# Patient Record
Sex: Female | Born: 1970
Health system: Southern US, Community
[De-identification: ages and names within clinical notes are randomized; demographics above are authoritative.]

## PROBLEM LIST (undated history)

## (undated) DIAGNOSIS — Z0282 Encounter for adoption services: Secondary | ICD-10-CM

## (undated) DIAGNOSIS — E119 Type 2 diabetes mellitus without complications: Secondary | ICD-10-CM

## (undated) DIAGNOSIS — J45909 Unspecified asthma, uncomplicated: Secondary | ICD-10-CM

## (undated) DIAGNOSIS — R519 Headache, unspecified: Secondary | ICD-10-CM

## (undated) DIAGNOSIS — Z789 Other specified health status: Secondary | ICD-10-CM

## (undated) DIAGNOSIS — S83519A Sprain of anterior cruciate ligament of unspecified knee, initial encounter: Secondary | ICD-10-CM

## (undated) DIAGNOSIS — R51 Headache: Secondary | ICD-10-CM

## (undated) DIAGNOSIS — E785 Hyperlipidemia, unspecified: Secondary | ICD-10-CM

## (undated) DIAGNOSIS — T7840XA Allergy, unspecified, initial encounter: Secondary | ICD-10-CM

## (undated) DIAGNOSIS — M1990 Unspecified osteoarthritis, unspecified site: Secondary | ICD-10-CM

## (undated) HISTORY — DX: Hyperlipidemia, unspecified: E78.5

## (undated) HISTORY — PX: FOOT SURGERY: SHX648

## (undated) HISTORY — DX: Type 2 diabetes mellitus without complications: E11.9

## (undated) HISTORY — DX: Allergy, unspecified, initial encounter: T78.40XA

---

## 2004-06-11 ENCOUNTER — Ambulatory Visit: Payer: Self-pay | Admitting: Unknown Physician Specialty

## 2010-08-06 ENCOUNTER — Ambulatory Visit: Payer: Self-pay

## 2010-09-08 ENCOUNTER — Ambulatory Visit: Payer: Self-pay

## 2012-07-21 ENCOUNTER — Ambulatory Visit: Payer: Self-pay | Admitting: Family Medicine

## 2013-02-20 ENCOUNTER — Ambulatory Visit: Payer: Self-pay | Admitting: Family Medicine

## 2013-10-22 ENCOUNTER — Ambulatory Visit: Payer: Self-pay | Admitting: Family Medicine

## 2013-12-03 HISTORY — PX: APPENDECTOMY: SHX54

## 2013-12-31 ENCOUNTER — Ambulatory Visit: Payer: Self-pay | Admitting: Surgery

## 2013-12-31 LAB — CBC WITH DIFFERENTIAL/PLATELET
BASOS ABS: 0.1 10*3/uL (ref 0.0–0.1)
Basophil %: 0.5 %
Eosinophil #: 0 10*3/uL (ref 0.0–0.7)
Eosinophil %: 0.2 %
HCT: 42.3 % (ref 35.0–47.0)
HGB: 13.9 g/dL (ref 12.0–16.0)
LYMPHS ABS: 1.8 10*3/uL (ref 1.0–3.6)
Lymphocyte %: 11.8 %
MCH: 29.2 pg (ref 26.0–34.0)
MCHC: 32.7 g/dL (ref 32.0–36.0)
MCV: 89 fL (ref 80–100)
MONO ABS: 0.7 x10 3/mm (ref 0.2–0.9)
MONOS PCT: 4.4 %
NEUTROS PCT: 83.1 %
Neutrophil #: 12.8 10*3/uL — ABNORMAL HIGH (ref 1.4–6.5)
Platelet: 401 10*3/uL (ref 150–440)
RBC: 4.75 10*6/uL (ref 3.80–5.20)
RDW: 12.7 % (ref 11.5–14.5)
WBC: 15.4 10*3/uL — ABNORMAL HIGH (ref 3.6–11.0)

## 2013-12-31 LAB — URINALYSIS, COMPLETE
Bacteria: NONE SEEN
Bilirubin,UR: NEGATIVE
Glucose,UR: 50 mg/dL (ref 0–75)
KETONE: NEGATIVE
Leukocyte Esterase: NEGATIVE
NITRITE: NEGATIVE
Ph: 5 (ref 4.5–8.0)
SPECIFIC GRAVITY: 1.023 (ref 1.003–1.030)
Squamous Epithelial: 4
WBC UR: 3 /HPF (ref 0–5)

## 2013-12-31 LAB — COMPREHENSIVE METABOLIC PANEL
ALT: 29 U/L (ref 12–78)
AST: 23 U/L (ref 15–37)
Albumin: 4.1 g/dL (ref 3.4–5.0)
Alkaline Phosphatase: 55 U/L
Anion Gap: 5 — ABNORMAL LOW (ref 7–16)
BUN: 9 mg/dL (ref 7–18)
Bilirubin,Total: 0.5 mg/dL (ref 0.2–1.0)
Calcium, Total: 9.2 mg/dL (ref 8.5–10.1)
Chloride: 106 mmol/L (ref 98–107)
Co2: 27 mmol/L (ref 21–32)
Creatinine: 1.08 mg/dL (ref 0.60–1.30)
EGFR (Non-African Amer.): >60
GLUCOSE: 178 mg/dL — AB (ref 65–99)
OSMOLALITY: 279 (ref 275–301)
POTASSIUM: 3.7 mmol/L (ref 3.5–5.1)
Sodium: 138 mmol/L (ref 136–145)
Total Protein: 7.8 g/dL (ref 6.4–8.2)

## 2013-12-31 LAB — LIPASE, BLOOD: Lipase: 198 U/L (ref 73–393)

## 2014-01-04 LAB — PATHOLOGY REPORT

## 2014-05-21 ENCOUNTER — Ambulatory Visit: Payer: Self-pay | Admitting: Family Medicine

## 2014-07-25 ENCOUNTER — Ambulatory Visit: Payer: Self-pay | Admitting: Specialist

## 2014-10-26 NOTE — Op Note (Signed)
PATIENT NAME:  Lisa NoraVERDECK, Makylee D MR#:  [id number] DATE OF BIRTH:  March 04, 1971  DATE OF PROCEDURE:  12/31/2013  PREOPERATIVE DIAGNOSIS: Acute appendicitis.   POSTOPERATIVE DIAGNOSIS: Acute appendicitis.   PROCEDURE: Laparoscopic appendectomy.   SURGEON: Richard E. Excell Seltzerooper, M.D.   ANESTHESIA: General with endotracheal tube.   INDICATIONS: This is a patient with two days of abdominal pain. She also had some microscopic hematuria but has not noticed any hematuria nor back pain. A CT scan suggests acute appendicitis, as does physical exam. Preoperatively, we discussed the rationale for surgery, the options of observation, risks of bleeding, infection, recurrence of symptoms, failure to resolve her symptoms, open procedure, bowel injury, and negative laparoscopy. This was all reviewed for her. She understood and agreed to proceed.   FINDINGS: Acute appendicitis in a retrocecal position, nonruptured.   DESCRIPTION OF PROCEDURE: The patient was induced to general anesthesia, given IV antibiotics. VTE prophylaxis was in place. She was prepped and draped in a sterile fashion, and a Foley catheter had been placed as well.   Local anesthetic was infiltrated in skin and subcutaneous tissues around the periumbilical area. Incision was made. Veress needle was placed. Pneumoperitoneum was obtained, and a 5 mm trocar port was placed. The abdominal cavity was explored and, under direct vision, a 12 mm left lateral port and a 5 mm suprapubic port were placed. The appendix was identified, invested within the visceral peritoneum of the cecum. The visceral peritoneum was taken down carefully to demonstrate the inflamed appendix.   The distal portion of the appendix was inflamed. The proximal portion was soft and pliable. The base of the appendix was divided with a standard load Endo GIA. The base of the appendix was then grasped and elevated, and the mesoappendix was divided with a vascular load Endo GIA. Specimen was  passed out through the lateral port site with the aid of an EndoCatch bag. The area was checked for hemostasis, found to be adequate. There was no sign of bleeding or bowel injury, and no further adhesions were noted. Therefore, the left lateral port site was closed under direct vision with multiple simple sutures of 0 Vicryl utilizing an Endo Close technique. Again, hemostasis was checked and the pneumoperitoneum was released. All ports were removed. A 4-0 subcuticular Monocryl was used on all skin edges. Steri-Strips, Mastisol and sterile dressings were placed.   The patient tolerated the procedure well. There were no complications. She was taken to the recovery room in stable condition to be admitted for continued care.    ____________________________ Adah Salvageichard E. Excell Seltzerooper, MD rec:cg D: 12/31/2013 16:35:35 ET T: 01/01/2014 04:42:14 ET JOB#: [id number]  cc: Adah Salvageichard E. Excell Seltzerooper, MD, <Dictator> Lattie HawICHARD E COOPER MD ELECTRONICALLY SIGNED 01/01/2014 9:33

## 2014-10-26 NOTE — H&P (Signed)
Subjective/Chief Complaint RLQ pain   History of Present Illness 36-48 hrs abd pain n/v nop f/c prior episode at age 44, hospitalized and observed no hematuria   Past History PMH DM PSH feet   Past Medical Health Diabetes Mellitus   Past Med/Surgical Hx:  Diabetes Mellitus, Type II (NIDD):   ALLERGIES:  No Known Allergies:   Family and Social History:  Family History Non-Contributory   Social History negative tobacco, negative ETOH   Place of Living Home   Review of Systems:  Fever/Chills No   Cough No   Abdominal Pain Yes   Diarrhea No   Constipation No   Nausea/Vomiting Yes   SOB/DOE No   Chest Pain No   Dysuria No   Tolerating Diet No  Nauseated  Vomiting   Medications/Allergies Reviewed Medications/Allergies reviewed   Physical Exam:  GEN no acute distress   HEENT pink conjunctivae   NECK supple   RESP normal resp effort  clear BS   CARD regular rate   ABD positive tenderness  guarding, rebound perc tendrness   LYMPH negative neck   EXTR negative edema   SKIN normal to palpation   PSYCH alert   Lab Results: Hepatic:  29-Jun-15 11:50   Bilirubin, Total 0.5  Alkaline Phosphatase 55 (45-117 NOTE: New Reference Range 05/25/13)  SGPT (ALT) 29  SGOT (AST) 23  Total Protein, Serum 7.8  Albumin, Serum 4.1  Routine Chem:  29-Jun-15 11:50   Glucose, Serum  178  BUN 9  Creatinine (comp) 1.08  Sodium, Serum 138  Potassium, Serum 3.7  Chloride, Serum 106  CO2, Serum 27  Calcium (Total), Serum 9.2  Osmolality (calc) 279  eGFR (African American) >60  eGFR (Non-African American) >60 (eGFR values <28m/min/1.73 m2 may be an indication of chronic kidney disease (CKD). Calculated eGFR is useful in patients with stable renal function. The eGFR calculation will not be reliable in acutely ill patients when serum creatinine is changing rapidly. It is not useful in  patients on dialysis. The eGFR calculation may not be applicable to  patients at the low and high extremes of body sizes, pregnant women, and vegetarians.)  Anion Gap  5  Lipase 198 (Result(s) reported on 31 Dec 2013 at 12:19PM.)  Routine UA:  29-Jun-15 11:50   Color (UA) Yellow  Clarity (UA) Hazy  Glucose (UA) 50 mg/dL  Bilirubin (UA) Negative  Ketones (UA) Negative  Specific Gravity (UA) 1.023  Blood (UA) 3+  pH (UA) 5.0  Protein (UA) 30 mg/dL  Nitrite (UA) Negative  Leukocyte Esterase (UA) Negative (Result(s) reported on 31 Dec 2013 at 12:19PM.)  RBC (UA) 446 /HPF  WBC (UA) 3 /HPF  Bacteria (UA) NONE SEEN  Epithelial Cells (UA) 4 /HPF  Mucous (UA) PRESENT (Result(s) reported on 31 Dec 2013 at 12:19PM.)  Routine Hem:  29-Jun-15 11:50   WBC (CBC)  15.4  RBC (CBC) 4.75  Hemoglobin (CBC) 13.9  Hematocrit (CBC) 42.3  Platelet Count (CBC) 401  MCV 89  MCH 29.2  MCHC 32.7  RDW 12.7  Neutrophil % 83.1  Lymphocyte % 11.8  Monocyte % 4.4  Eosinophil % 0.2  Basophil % 0.5  Neutrophil #  12.8  Lymphocyte # 1.8  Monocyte # 0.7  Eosinophil # 0.0  Basophil # 0.1 (Result(s) reported on 31 Dec 2013 at 12:11PM.)   Radiology Results: CT:    29-Jun-15 14:24, CT Abdomen and Pelvis With Contrast  CT Abdomen and Pelvis With Contrast  REASON FOR EXAM:    (  1) RLQ pain r/o appy; (2) RLQ pain r/o appy  COMMENTS:       PROCEDURE: CT  - CT ABDOMEN / PELVIS  W  - Dec 31 2013  2:24PM     CLINICAL DATA:  Right lower quadrant pain.  Urinary frequency.    EXAM:  CT ABDOMEN AND PELVIS WITH CONTRAST    TECHNIQUE:  Multidetector CT imaging of the abdomen and pelvis was performed  using the standard protocol following bolus administration of  intravenous contrast.  CONTRAST:  100 mL Isovue 370    COMPARISON:  None.    FINDINGS:  The distal appendix is prominent measuring approximately 8 mm in  maximum diameter. No significant periappendiceal inflammatory change  seen, and the proximal appendix is not enlarged. These findings are  equivocal for early  appendicitis.    No other inflammatory process or abnormal fluid collections  identified within the abdomen or pelvis. No evidence of bowel wall  thickening or dilatation.    The liver, gallbladder, pancreas, spleen, adrenal glands, and  kidneys are normal in appearance. No evidence hydronephrosis. No  soft tissue masses or lymphadenopathy identified. Uterus and adnexal  regions are unremarkable. No suspicious bone lesions identified.     IMPRESSION:  Findings which are equivocal for early distal appendicitis.  Recommend correlation with physical exam findings and continued  clinical observation ; consider followup by CT in 24 hr if  warranted.      Electronically Signed    By: Earle Gell M.D.    On: 12/31/2013 14:38     Verified By: Marlaine Hind, M.D.,    Assessment/Admission Diagnosis acute appendicits rec lap appy risks and options agrees with plan   Electronic Signatures: Florene Glen (MD)  (Signed 29-Jun-15 15:10)  Authored: CHIEF COMPLAINT and HISTORY, PAST MEDICAL/SURGIAL HISTORY, ALLERGIES, FAMILY AND SOCIAL HISTORY, REVIEW OF SYSTEMS, PHYSICAL EXAM, LABS, Radiology, ASSESSMENT AND PLAN   Last Updated: 29-Jun-15 15:10 by Florene Glen (MD)

## 2014-12-30 DIAGNOSIS — A770 Spotted fever due to Rickettsia rickettsii: Secondary | ICD-10-CM | POA: Insufficient documentation

## 2014-12-30 DIAGNOSIS — G5600 Carpal tunnel syndrome, unspecified upper limb: Secondary | ICD-10-CM | POA: Insufficient documentation

## 2014-12-30 DIAGNOSIS — E7800 Pure hypercholesterolemia, unspecified: Secondary | ICD-10-CM | POA: Insufficient documentation

## 2014-12-30 DIAGNOSIS — E1165 Type 2 diabetes mellitus with hyperglycemia: Secondary | ICD-10-CM | POA: Insufficient documentation

## 2014-12-30 DIAGNOSIS — E559 Vitamin D deficiency, unspecified: Secondary | ICD-10-CM | POA: Insufficient documentation

## 2014-12-30 DIAGNOSIS — O039 Complete or unspecified spontaneous abortion without complication: Secondary | ICD-10-CM | POA: Insufficient documentation

## 2014-12-30 DIAGNOSIS — E119 Type 2 diabetes mellitus without complications: Secondary | ICD-10-CM | POA: Insufficient documentation

## 2015-01-03 ENCOUNTER — Encounter: Payer: Self-pay | Admitting: Physician Assistant

## 2015-01-03 ENCOUNTER — Ambulatory Visit (INDEPENDENT_AMBULATORY_CARE_PROVIDER_SITE_OTHER): Payer: Commercial Managed Care - HMO | Admitting: Physician Assistant

## 2015-01-03 VITALS — BP 132/76 | HR 100 | Temp 98.9°F | Resp 16 | Ht 62.25 in | Wt 180.0 lb

## 2015-01-03 DIAGNOSIS — E7800 Pure hypercholesterolemia, unspecified: Secondary | ICD-10-CM

## 2015-01-03 DIAGNOSIS — Z0000 Encounter for general adult medical examination without abnormal findings: Secondary | ICD-10-CM

## 2015-01-03 DIAGNOSIS — E119 Type 2 diabetes mellitus without complications: Secondary | ICD-10-CM

## 2015-01-03 LAB — POCT UA - MICROALBUMIN: Microalbumin Ur, POC: 50 mg/L — NL

## 2015-01-03 NOTE — Patient Instructions (Signed)
Health Maintenance Adopting a healthy lifestyle and getting preventive care can go a long way to promote health and wellness. Talk with your health care provider about what schedule of regular examinations is right for you. This is a good chance for you to check in with your provider about disease prevention and staying healthy. In between checkups, there are plenty of things you can do on your own. Experts have done a lot of research about which lifestyle changes and preventive measures are most likely to keep you healthy. Ask your health care provider for more information. WEIGHT AND DIET  Eat a healthy diet 1. Be sure to include plenty of vegetables, fruits, low-fat dairy products, and lean protein. 2. Do not eat a lot of foods high in solid fats, added sugars, or salt. 3. Get regular exercise. This is one of the most important things you can do for your health. 1. Most adults should exercise for at least 150 minutes each week. The exercise should increase your heart rate and make you sweat (moderate-intensity exercise). 2. Most adults should also do strengthening exercises at least twice a week. This is in addition to the moderate-intensity exercise.  Maintain a healthy weight 1. Body mass index (BMI) is a measurement that can be used to identify possible weight problems. It estimates body fat based on height and weight. Your health care provider can help determine your BMI and help you achieve or maintain a healthy weight. 2. For females 6 years of age and older:  1. A BMI below 18.5 is considered underweight. 2. A BMI of 18.5 to 24.9 is normal. 3. A BMI of 25 to 29.9 is considered overweight. 4. A BMI of 30 and above is considered obese.  Watch levels of cholesterol and blood lipids 1. You should start having your blood tested for lipids and cholesterol at 46 years of age, then have this test every 5 years. 2. You may need to have your cholesterol levels checked more often if: 1. Your  lipid or cholesterol levels are high. 2. You are older than 46 years of age. 3. You are at high risk for heart disease.  CANCER SCREENING   Lung Cancer 1. Lung cancer screening is recommended for adults 7-87 years old who are at high risk for lung cancer because of a history of smoking. 2. A yearly low-dose CT scan of the lungs is recommended for people who: 1. Currently smoke. 2. Have quit within the past 15 years. 3. Have at least a 30-pack-year history of smoking. A pack year is smoking an average of one pack of cigarettes a day for 1 year. 3. Yearly screening should continue until it has been 15 years since you quit. 4. Yearly screening should stop if you develop a health problem that would prevent you from having lung cancer treatment.  Breast Cancer  Practice breast self-awareness. This means understanding how your breasts normally appear and feel.  It also means doing regular breast self-exams. Let your health care provider know about any changes, no matter how small.  If you are in your 20s or 30s, you should have a clinical breast exam (CBE) by a health care provider every 1-3 years as part of a regular health exam.  If you are 30 or older, have a CBE every year. Also consider having a breast X-Mad[place] (mammogram) every year.  If you have a family history of breast cancer, talk to your health care provider about genetic screening.  If you are  at high risk for breast cancer, talk to your health care provider about having an MRI and a mammogram every year.  Breast cancer gene (BRCA) assessment is recommended for women who have family members with BRCA-related cancers. BRCA-related cancers include:  Breast.  Ovarian.  Tubal.  Peritoneal cancers.  Results of the assessment will determine the need for genetic counseling and BRCA1 and BRCA2 testing. Cervical Cancer Routine pelvic examinations to screen for cervical cancer are no longer recommended for nonpregnant women who  are considered low risk for cancer of the pelvic organs (ovaries, uterus, and vagina) and who do not have symptoms. A pelvic examination may be necessary if you have symptoms including those associated with pelvic infections. Ask your health care provider if a screening pelvic exam is right for you.   The Pap test is the screening test for cervical cancer for women who are considered at risk.  If you had a hysterectomy for a problem that was not cancer or a condition that could lead to cancer, then you no longer need Pap tests.  If you are older than 65 years, and you have had normal Pap tests for the past 10 years, you no longer need to have Pap tests.  If you have had past treatment for cervical cancer or a condition that could lead to cancer, you need Pap tests and screening for cancer for at least 20 years after your treatment.  If you no longer get a Pap test, assess your risk factors if they change (such as having a new sexual partner). This can affect whether you should start being screened again.  Some women have medical problems that increase their chance of getting cervical cancer. If this is the case for you, your health care provider may recommend more frequent screening and Pap tests.  The human papillomavirus (HPV) test is another test that may be used for cervical cancer screening. The HPV test looks for the virus that can cause cell changes in the cervix. The cells collected during the Pap test can be tested for HPV.  The HPV test can be used to screen women 2 years of age and older. Getting tested for HPV can extend the interval between normal Pap tests from three to five years.  An HPV test also should be used to screen women of any age who have unclear Pap test results.  After 46 years of age, women should have HPV testing as often as Pap tests.  Colorectal Cancer  This type of cancer can be detected and often prevented.  Routine colorectal cancer screening usually  begins at 46 years of age and continues through 46 years of age.  Your health care provider may recommend screening at an earlier age if you have risk factors for colon cancer.  Your health care provider may also recommend using home test kits to check for hidden blood in the stool.  A small camera at the end of a tube can be used to examine your colon directly (sigmoidoscopy or colonoscopy). This is done to check for the earliest forms of colorectal cancer.  Routine screening usually begins at age 57.  Direct examination of the colon should be repeated every 5-10 years through 46 years of age. However, you may need to be screened more often if early forms of precancerous polyps or small growths are found. Skin Cancer  Check your skin from head to toe regularly.  Tell your health care provider about any new moles or changes in  moles, especially if there is a change in a mole's shape or color.  Also tell your health care provider if you have a mole that is larger than the size of a pencil eraser.  Always use sunscreen. Apply sunscreen liberally and repeatedly throughout the day.  Protect yourself by wearing long sleeves, pants, a wide-brimmed hat, and sunglasses whenever you are outside. HEART DISEASE, DIABETES, AND HIGH BLOOD PRESSURE   Have your blood pressure checked at least every 1-2 years. High blood pressure causes heart disease and increases the risk of stroke.  If you are between 32 years and 30 years old, ask your health care provider if you should take aspirin to prevent strokes.  Have regular diabetes screenings. This involves taking a blood sample to check your fasting blood sugar level.  If you are at a normal weight and have a low risk for diabetes, have this test once every three years after 46 years of age.  If you are overweight and have a high risk for diabetes, consider being tested at a younger age or more often. PREVENTING INFECTION  Hepatitis B  If you have a  higher risk for hepatitis B, you should be screened for this virus. You are considered at high risk for hepatitis B if:  You were born in a country where hepatitis B is common. Ask your health care provider which countries are considered high risk.  Your parents were born in a high-risk country, and you have not been immunized against hepatitis B (hepatitis B vaccine).  You have HIV or AIDS.  You use needles to inject street drugs.  You live with someone who has hepatitis B.  You have had sex with someone who has hepatitis B.  You get hemodialysis treatment.  You take certain medicines for conditions, including cancer, organ transplantation, and autoimmune conditions. Hepatitis C  Blood testing is recommended for:  Everyone born from [birth year] through 1967.  Anyone with known risk factors for hepatitis C. Sexually transmitted infections (STIs)  You should be screened for sexually transmitted infections (STIs) including gonorrhea and chlamydia if:  You are sexually active and are younger than 47 years of age.  You are older than 46 years of age and your health care provider tells you that you are at risk for this type of infection.  Your sexual activity has changed since you were last screened and you are at an increased risk for chlamydia or gonorrhea. Ask your health care provider if you are at risk.  If you do not have HIV, but are at risk, it may be recommended that you take a prescription medicine daily to prevent HIV infection. This is called pre-exposure prophylaxis (PrEP). You are considered at risk if:  You are sexually active and do not regularly use condoms or know the HIV status of your partner(s).  You take drugs by injection.  You are sexually active with a partner who has HIV. Talk with your health care provider about whether you are at high risk of being infected with HIV. If you choose to begin PrEP, you should first be tested for HIV. You should then be tested  every 3 months for as long as you are taking PrEP.  PREGNANCY   If you are premenopausal and you may become pregnant, ask your health care provider about preconception counseling.  If you may become pregnant, take 400 to 800 micrograms (mcg) of folic acid every day.  If you want to prevent pregnancy, talk to your  health care provider about birth control (contraception). OSTEOPOROSIS AND MENOPAUSE   Osteoporosis is a disease in which the bones lose minerals and strength with aging. This can result in serious bone fractures. Your risk for osteoporosis can be identified using a bone density scan.  If you are 76 years of age or older, or if you are at risk for osteoporosis and fractures, ask your health care provider if you should be screened.  Ask your health care provider whether you should take a calcium or vitamin D supplement to lower your risk for osteoporosis.  Menopause may have certain physical symptoms and risks.  Hormone replacement therapy may reduce some of these symptoms and risks. Talk to your health care provider about whether hormone replacement therapy is right for you.  HOME CARE INSTRUCTIONS   Schedule regular health, dental, and eye exams.  Stay current with your immunizations.   Do not use any tobacco products including cigarettes, chewing tobacco, or electronic cigarettes.  If you are pregnant, do not drink alcohol.  If you are breastfeeding, limit how much and how often you drink alcohol.  Limit alcohol intake to no more than 1 drink per day for nonpregnant women. One drink equals 12 ounces of beer, 5 ounces of wine, or 1 ounces of hard liquor.  Do not use street drugs.  Do not share needles.  Ask your health care provider for help if you need support or information about quitting drugs.  Tell your health care provider if you often feel depressed.  Tell your health care provider if you have ever been abused or do not feel safe at home. Document  Released: 01/04/2011 Document Revised: 11/05/2013 Document Reviewed: 05/23/2013 [facility] Patient Information 2015 [place], [place]. This information is not intended to replace advice given to you by your health care provider. Make sure you discuss any questions you have with your health care provider.  Fat and Cholesterol Control Diet Fat and cholesterol levels in your blood and organs are influenced by your diet. High levels of fat and cholesterol may lead to diseases of the heart, small and large blood vessels, gallbladder, liver, and pancreas. CONTROLLING FAT AND CHOLESTEROL WITH DIET Although exercise and lifestyle factors are important, your diet is key. That is because certain foods are known to raise cholesterol and others to lower it. The goal is to balance foods for their effect on cholesterol and more importantly, to replace saturated and trans fat with other types of fat, such as monounsaturated fat, polyunsaturated fat, and omega-3 fatty acids. On average, a person should consume no more than 15 to 17 g of saturated fat daily. Saturated and trans fats are considered "bad" fats, and they will raise LDL cholesterol. Saturated fats are primarily found in animal products such as meats, butter, and cream. However, that does not mean you need to give up all your favorite foods. Today, there are good tasting, low-fat, low-cholesterol substitutes for most of the things you like to eat. Choose low-fat or nonfat alternatives. Choose round or loin cuts of red meat. These types of cuts are lowest in fat and cholesterol. Chicken (without the skin), fish, veal, and ground [place] breast are great choices. Eliminate fatty meats, such as hot dogs and salami. Even shellfish have little or no saturated fat. Have a 3 oz (85 g) portion when you eat lean meat, poultry, or fish. Trans fats are also called "partially hydrogenated oils." They are oils that have been scientifically manipulated so that they are solid at  room temperature resulting in a longer shelf life and improved taste and texture of foods in which they are added. Trans fats are found in stick margarine, some tub margarines, cookies, crackers, and baked goods.  When baking and cooking, oils are a great substitute for butter. The monounsaturated oils are especially beneficial since it is believed they lower LDL and raise HDL. The oils you should avoid entirely are saturated tropical oils, such as coconut and palm.  Remember to eat a lot from food groups that are naturally free of saturated and trans fat, including fish, fruit, vegetables, beans, grains (barley, rice, couscous, bulgur wheat), and pasta (without cream sauces).  IDENTIFYING FOODS THAT LOWER FAT AND CHOLESTEROL  Soluble fiber may lower your cholesterol. This type of fiber is found in fruits such as apples, vegetables such as broccoli, potatoes, and carrots, legumes such as beans, peas, and lentils, and grains such as barley. Foods fortified with plant sterols (phytosterol) may also lower cholesterol. You should eat at least 2 g per day of these foods for a cholesterol lowering effect.  Read package labels to identify low-saturated fats, trans fat free, and low-fat foods at the supermarket. Select cheeses that have only 2 to 3 g saturated fat per ounce. Use a heart-healthy tub margarine that is free of trans fats or partially hydrogenated oil. When buying baked goods (cookies, crackers), avoid partially hydrogenated oils. Breads and muffins should be made from whole grains (whole-wheat or whole oat flour, instead of "flour" or "enriched flour"). Buy non-creamy canned soups with reduced salt and no added fats.  FOOD PREPARATION TECHNIQUES  Never deep-fry. If you must fry, either stir-fry, which uses very little fat, or use non-stick cooking sprays. When possible, broil, bake, or roast meats, and steam vegetables. Instead of putting butter or margarine on vegetables, use lemon and herbs,  applesauce, and cinnamon (for squash and sweet potatoes). Use nonfat yogurt, salsa, and low-fat dressings for salads.  LOW-SATURATED FAT / LOW-FAT FOOD SUBSTITUTES Meats / Saturated Fat (g) 4. Avoid: Steak, marbled (3 oz/85 g) / 11 g 5. Choose: Steak, lean (3 oz/85 g) / 4 g 3. Avoid: Hamburger (3 oz/85 g) / 7 g 4. Choose: Hamburger, lean (3 oz/85 g) / 5 g 3. Avoid: Ham (3 oz/85 g) / 6 g 4. Choose: Ham, lean cut (3 oz/85 g) / 2.4 g 5. Avoid: Chicken, with skin, dark meat (3 oz/85 g) / 4 g 6. Choose: Chicken, skin removed, dark meat (3 oz/85 g) / 2 g  Avoid: Chicken, with skin, light meat (3 oz/85 g) / 2.5 g  Choose: Chicken, skin removed, light meat (3 oz/85 g) / 1 g Dairy / Saturated Fat (g)  Avoid: Whole milk (1 cup) / 5 g  Choose: Low-fat milk, 2% (1 cup) / 3 g  Choose: Low-fat milk, 1% (1 cup) / 1.5 g  Choose: Skim milk (1 cup) / 0.3 g  Avoid: Hard cheese (1 oz/28 g) / 6 g  Choose: Skim milk cheese (1 oz/28 g) / 2 to 3 g  Avoid: Cottage cheese, 4% fat (1 cup) / 6.5 g  Choose: Low-fat cottage cheese, 1% fat (1 cup) / 1.5 g  Avoid: Ice cream (1 cup) / 9 g  Choose: Sherbet (1 cup) / 2.5 g  Choose: Nonfat frozen yogurt (1 cup) / 0.3 g  Choose: Frozen fruit bar / trace  Avoid: Whipped cream (1 tbs) / 3.5 g  Choose: Nondairy whipped topping (1 tbs) / 1 g Condiments /   Saturated Fat (g)  Avoid: Mayonnaise (1 tbs) / 2 g  Choose: Low-fat mayonnaise (1 tbs) / 1 g  Avoid: Butter (1 tbs) / 7 g  Choose: Extra light margarine (1 tbs) / 1 g  Avoid: Coconut oil (1 tbs) / 11.8 g  Choose: Olive oil (1 tbs) / 1.8 g  Choose: Corn oil (1 tbs) / 1.7 g  Choose: Safflower oil (1 tbs) / 1.2 g  Choose: Sunflower oil (1 tbs) / 1.4 g  Choose: Soybean oil (1 tbs) / 2.4 g  Choose: Canola oil (1 tbs) / 1 g Document Released: 06/21/2005 Document Revised: 10/16/2012 Document Reviewed: 09/19/2013 [employer] Patient Information 2015 [place], [place]. This information is not intended  to replace advice given to you by your health care provider. Make sure you discuss any questions you have with your health care provider.  American Heart Association (AHA) Exercise Recommendation  Being physically active is important to prevent heart disease and stroke, the nation's No. 1and No. 5killers. To improve overall cardiovascular health, we suggest at least 150 minutes per week of moderate exercise or 75 minutes per week of vigorous exercise (or a combination of moderate and vigorous activity). Thirty minutes a day, five times a week is an easy goal to remember. You will also experience benefits even if you divide your time into two or three segments of 10 to 15 minutes per day.  For people who would benefit from lowering their blood pressure or cholesterol, we recommend 40 minutes of aerobic exercise of moderate to vigorous intensity three to four times a week to lower the risk for heart attack and stroke.  Physical activity is anything that makes you move your body and burn calories.  This includes things like climbing stairs or playing sports. Aerobic exercises benefit your heart, and include walking, jogging, swimming or biking. Strength and stretching exercises are best for overall stamina and flexibility.  The simplest, positive change you can make to effectively improve your heart health is to start walking. It's enjoyable, free, easy, social and great exercise. A walking program is flexible and boasts high success rates because people can stick with it. It's easy for walking to become a regular and satisfying part of life.   For Overall Cardiovascular Health:  At least 30 minutes of moderate-intensity aerobic activity at least 5 days per week for a total of 150  OR   At least 25 minutes of vigorous aerobic activity at least 3 days per week for a total of 75 minutes; or a combination of moderate- and vigorous-intensity aerobic activity  AND   Moderate- to high-intensity  muscle-strengthening activity at least 2 days per week for additional health benefits.  For Lowering Blood Pressure and Cholesterol  An average 40 minutes of moderate- to vigorous-intensity aerobic activity 3 or 4 times per week  What if I can't make it to the time goal? Something is always better than nothing! And everyone has to start somewhere. Even if you've been sedentary for years, today is the day you can begin to make healthy changes in your life. If you don't think you'll make it for 30 or 40 minutes, set a reachable goal for today. You can work up toward your overall goal by increasing your time as you get stronger. Don't let all-or-nothing thinking rob you of doing what you can every day.  Source:http://www.heart.org

## 2015-01-03 NOTE — Progress Notes (Signed)
Patient ID: Lisa Rivers, female   DOB: [birth year], 44 y.o.   MRN: [id number]       Patient: Lisa Rivers, Female    DOB: 05-Jan-1971, 44 y.o.   MRN: [id number] Visit Date: 01/03/2015  Today's Provider: Margaretann Loveless, PA-C   Chief Complaint  Patient presents with  . Annual Exam   Subjective:    Annual physical exam Lisa Rivers is a 44 y.o. female who presents today for health maintenance and complete physical. She feels well. She reports exercising daily-swimming. She reports she is sleeping fairly well-has hard time going back to sleep if she wakes up early.  LAST: CPE- 12/13/2012  Mammogram per patient in 2016-normal.  EKG 08/06/2010  Pap with HPV-normal results-08/20/2010-never had hysterectomy and never had a history of abnormal pap smear.  Tdap 08/20/2010   Eye exam- per patient less than a year.  Never has colonoscopy, never had BMD done. Never had Zostavax or Pneumovax.  Review of Systems  Constitutional: Negative.   HENT: Negative.   Eyes: Negative.   Respiratory: Negative.   Cardiovascular: Negative.   Gastrointestinal: Negative.   Endocrine: Negative.   Genitourinary: Negative.   Musculoskeletal: Negative.   Skin: Negative.   Allergic/Immunologic: Negative.   Neurological: Negative.   Hematological: Negative.   Psychiatric/Behavioral: Negative.     Social History She  reports that she has never smoked. She has never used smokeless tobacco. She reports that she does not drink alcohol or use illicit drugs.  Patient Active Problem List   Diagnosis Date Noted  . Abortion, spontaneous 12/30/2014  . Abortion 12/30/2014  . Carpal tunnel syndrome 12/30/2014  . Diabetes 12/30/2014  . Hypercholesterolemia without hypertriglyceridemia 12/30/2014  . Fritzi Mandes 12/30/2014  . Avitaminosis D 12/30/2014    Past Surgical History  Procedure Laterality Date  . Appendectomy  12/2013  . Foot surgery  [id number]    BOTH FEET    Family  History Her family history includes Alcohol abuse in her father and mother; Coronary artery disease in her mother; Diabetes in her brother and father; Heart disease in her mother.    Previous Medications   ALBUTEROL (PROAIR HFA) 108 (90 BASE) MCG/ACT INHALER    Inhale into the lungs as needed.   B COMPLEX VITAMINS CAPSULE    Take 1 capsule by mouth daily.   METFORMIN (GLUCOPHAGE) 1000 MG TABLET    Take 1 tablet by mouth 2 (two) times daily.   PEDIATRIC MULTIPLE VIT-C-FA PO    Take 1 tablet by mouth daily.   SIMVASTATIN (ZOCOR) 20 MG TABLET    Take 1 tablet by mouth at bedtime.    Patient Care Team: Margaretann Loveless, PA-C as PCP - General (Physician Assistant)     Objective:   Vitals: BP 132/76 mmHg  Pulse 100  Temp(Src) 98.9 F (37.2 C)  Resp 16  Ht 5' 2.25" (1.581 m)  Wt 180 lb (81.647 kg)  BMI 32.66 kg/m2  LMP 12/07/2014   Physical Exam  Constitutional: She is oriented to person, place, and time. She appears well-developed and well-nourished. No distress.  HENT:  Head: Normocephalic and atraumatic.  Right Ear: Hearing, tympanic membrane, external ear and ear canal normal.  Left Ear: Hearing, tympanic membrane, external ear and ear canal normal.  Nose: Nose normal.  Mouth/Throat: Uvula is midline, oropharynx is clear and moist and mucous membranes are normal. No oropharyngeal exudate.  Eyes: EOM are normal. Pupils are equal, round, and reactive to light. Right eye exhibits  no discharge. Left eye exhibits no discharge. No scleral icterus.  Neck: Normal range of motion. Neck supple. No JVD present. Carotid bruit is not present. No tracheal deviation present. No thyromegaly present.  Cardiovascular: Normal rate, regular rhythm, normal heart sounds and intact distal pulses.  Exam reveals no gallop and no friction rub.   No murmur heard. Pulmonary/Chest: Effort normal and breath sounds normal. No respiratory distress. She has no wheezes. She has no rales. She exhibits no  tenderness. Right breast exhibits no inverted nipple, no mass, no nipple discharge, no skin change and no tenderness. Left breast exhibits no inverted nipple, no mass, no nipple discharge, no skin change and no tenderness. Breasts are symmetrical.  Abdominal: Soft. Bowel sounds are normal. She exhibits no distension and no mass. There is no tenderness. There is no rebound and no guarding. Hernia confirmed negative in the right inguinal area and confirmed negative in the left inguinal area.  Genitourinary: Rectum normal, vagina normal and uterus normal. Guaiac negative stool. No breast swelling, tenderness, discharge or bleeding. Pelvic exam was performed with patient supine. There is no rash, tenderness, lesion or injury on the right labia. There is no rash, tenderness, lesion or injury on the left labia. Uterus is not tender. Cervix exhibits discharge (bloody discharge noted from cervical os but she is close to her menstrual cycle). Cervix exhibits no motion tenderness and no friability. Right adnexum displays no mass and no fullness. Left adnexum displays no mass, no tenderness and no fullness. No erythema or tenderness in the vagina. No vaginal discharge found.    Musculoskeletal: Normal range of motion. She exhibits no edema or tenderness.  Lymphadenopathy:    She has no cervical adenopathy.       Right: No inguinal adenopathy present.       Left: No inguinal adenopathy present.  Neurological: She is alert and oriented to person, place, and time. No cranial nerve deficit. Coordination normal.  Skin: Skin is warm and dry. No rash noted. She is not diaphoretic.  Psychiatric: She has a normal mood and affect. Her behavior is normal. Judgment and thought content normal.  Vitals reviewed.    Depression Screen Done today-01/03/15    Assessment & Plan:     Routine Health Maintenance and Physical Exam    Immunization History  Administered Date(s) Administered  . Tdap 08/20/2010          Discussed health benefits of physical activity, and encouraged her to engage in regular exercise appropriate for her age and condition.   1. Routine general medical examination at a health care facility  - CBC with Differential - Comprehensive metabolic panel - TSH - Pap IG w/ reflex to HPV when [facility] ([facility])  2. Type 2 diabetes mellitus without complication Microalbumin was WNL today.  Will check labs and f/u in 6 months. - Hemoglobin A1c - POCT UA - Microalbumin  3. Hypercholesterolemia without hypertriglyceridemia Will check labs and f/u in 6 months. - Lipid panel    --------------------------------------------------------------------

## 2015-01-08 LAB — PAP IG W/ RFLX HPV ASCU: PAP Smear Comment: 0

## 2015-01-09 ENCOUNTER — Telehealth: Payer: Self-pay

## 2015-01-09 LAB — LIPID PANEL
CHOLESTEROL TOTAL: 137 mg/dL (ref 100–199)
Chol/HDL Ratio: 2.7 ratio units (ref 0.0–4.4)
HDL: 50 mg/dL (ref >39)
LDL Calculated: 61 mg/dL (ref 0–99)
TRIGLYCERIDES: 131 mg/dL (ref 0–149)
VLDL Cholesterol Cal: 26 mg/dL (ref 5–40)

## 2015-01-09 LAB — CBC WITH DIFFERENTIAL/PLATELET
BASOS ABS: 0 10*3/uL (ref 0.0–0.2)
Basos: 1 %
EOS (ABSOLUTE): 0.3 10*3/uL (ref 0.0–0.4)
EOS: 4 %
Hematocrit: 41.3 % (ref 34.0–46.6)
Hemoglobin: 13.7 g/dL (ref 11.1–15.9)
IMMATURE GRANS (ABS): 0 10*3/uL (ref 0.0–0.1)
Immature Granulocytes: 0 %
Lymphocytes Absolute: 2.2 10*3/uL (ref 0.7–3.1)
Lymphs: 31 %
MCH: 29.3 pg (ref 26.6–33.0)
MCHC: 33.2 g/dL (ref 31.5–35.7)
MCV: 88 fL (ref 79–97)
MONOCYTES: 4 %
Monocytes Absolute: 0.3 10*3/uL (ref 0.1–0.9)
Neutrophils Absolute: 4.4 10*3/uL (ref 1.4–7.0)
Neutrophils: 60 %
PLATELETS: 372 10*3/uL (ref 150–379)
RBC: 4.67 x10E6/uL (ref 3.77–5.28)
RDW: 13 % (ref 12.3–15.4)
WBC: 7.2 10*3/uL (ref 3.4–10.8)

## 2015-01-09 LAB — HEMOGLOBIN A1C
Est. average glucose Bld gHb Est-mCnc: 128 mg/dL
HEMOGLOBIN A1C: 6.1 % — AB (ref 4.8–5.6)

## 2015-01-09 LAB — COMPREHENSIVE METABOLIC PANEL
ALBUMIN: 4.2 g/dL (ref 3.5–5.5)
ALT: 31 IU/L (ref 0–32)
AST: 30 IU/L (ref 0–40)
Albumin/Globulin Ratio: 1.8 (ref 1.1–2.5)
Alkaline Phosphatase: 51 IU/L (ref 39–117)
BUN/Creatinine Ratio: 13 (ref 9–23)
BUN: 10 mg/dL (ref 6–24)
Bilirubin Total: 0.5 mg/dL (ref 0.0–1.2)
CHLORIDE: 98 mmol/L (ref 97–108)
CO2: 22 mmol/L (ref 18–29)
Calcium: 9 mg/dL (ref 8.7–10.2)
Creatinine, Ser: 0.78 mg/dL (ref 0.57–1.00)
GFR calc Af Amer: 106 mL/min/{1.73_m2} (ref >59)
GFR, EST NON AFRICAN AMERICAN: 92 mL/min/{1.73_m2} (ref >59)
GLUCOSE: 116 mg/dL — AB (ref 65–99)
Globulin, Total: 2.4 g/dL (ref 1.5–4.5)
Potassium: 4.3 mmol/L (ref 3.5–5.2)
Sodium: 139 mmol/L (ref 134–144)
TOTAL PROTEIN: 6.6 g/dL (ref 6.0–8.5)

## 2015-01-09 LAB — TSH: TSH: 1.73 u[IU]/mL (ref 0.450–4.500)

## 2015-01-09 NOTE — Telephone Encounter (Signed)
-----   Message from Margaretann LovelessJennifer M Burnette, [place]-C sent at 01/09/2015  8:25 AM EDT ----- All labs are stable and WNL with exception of HgBA1c which is elevated at 6.1 indicating pre-diabetes.  A HgBA1c of 6.4 is indicative of diabetes.  Try to work on diet and exercise with limiting sugars and carbohydrates in diet and adding 30 minutes of exercise for a minimum of 3-4 days per week.  We will recheck HgBA1c in 6 months.

## 2015-01-09 NOTE — Telephone Encounter (Signed)
Patient advised as directed below. Patient verbalized understanding and agrees with treatment plan. 

## 2015-02-04 ENCOUNTER — Other Ambulatory Visit: Payer: Self-pay

## 2015-02-04 DIAGNOSIS — E119 Type 2 diabetes mellitus without complications: Secondary | ICD-10-CM

## 2015-02-05 MED ORDER — METFORMIN HCL 1000 MG PO TABS: 1000.0000 mg | ORAL_TABLET | Freq: Two times a day (BID) | ORAL | Status: DC

## 2015-02-06 ENCOUNTER — Ambulatory Visit (INDEPENDENT_AMBULATORY_CARE_PROVIDER_SITE_OTHER): Payer: Commercial Managed Care - HMO | Admitting: Family Medicine

## 2015-02-06 ENCOUNTER — Encounter: Payer: Self-pay | Admitting: Family Medicine

## 2015-02-06 VITALS — BP 136/90 | HR 73 | Temp 98.3°F | Resp 16 | Ht 62.0 in | Wt 177.8 lb

## 2015-02-06 DIAGNOSIS — J301 Allergic rhinitis due to pollen: Secondary | ICD-10-CM | POA: Diagnosis not present

## 2015-02-06 DIAGNOSIS — J069 Acute upper respiratory infection, unspecified: Secondary | ICD-10-CM | POA: Diagnosis not present

## 2015-02-06 DIAGNOSIS — J309 Allergic rhinitis, unspecified: Secondary | ICD-10-CM | POA: Insufficient documentation

## 2015-02-06 MED ORDER — FLUTICASONE PROPIONATE 50 MCG/ACT NA SUSP: 2.0000 | Freq: Every day | NASAL | Status: DC

## 2015-02-06 NOTE — Progress Notes (Signed)
Subjective:     Patient ID: Lisa Rivers, female   DOB: June 28, 1971, 44 y.o.   MRN: [id number]  HPI  Chief Complaint  Patient presents with  . Cough    Patient comes in office today with concerns of cough and congestion since 7/30. Patient states that her cough has been productive of yellow phlegm, associated symptoms include: sore throat and headache. Patient reports taking Fluticasone nasal spray and Hydrocodone-Homatropine at bedtime for relief.   States she has been around an ill grandson. Reports low grade fevers.   Review of Systems  Constitutional: Negative for chills.       Objective:   Physical Exam  Constitutional: She appears well-developed and well-nourished. No distress.  Ears: T.M's intact without inflammation Throat: no tonsillar enlargement or exudate Neck: no cervical adenopathy Lungs: clear     Assessment:    1. Upper respiratory infection  2. Allergic rhinitis due to pollen - fluticasone (FLONASE) 50 MCG/ACT nasal spray; Place 2 sprays into both nostrils daily.  Dispense: 16 g; Refill: 6    Plan:    Discussed adding Mucinex.

## 2015-02-06 NOTE — Patient Instructions (Signed)
Discussed use of Mucinex and cough syrup as needed.

## 2015-03-14 ENCOUNTER — Other Ambulatory Visit: Payer: Self-pay | Admitting: Family Medicine

## 2015-04-10 ENCOUNTER — Encounter: Payer: Self-pay | Admitting: Physician Assistant

## 2015-05-13 ENCOUNTER — Ambulatory Visit (INDEPENDENT_AMBULATORY_CARE_PROVIDER_SITE_OTHER): Payer: Commercial Managed Care - HMO | Admitting: Family Medicine

## 2015-05-13 ENCOUNTER — Encounter: Payer: Self-pay | Admitting: Family Medicine

## 2015-05-13 VITALS — BP 104/70 | HR 95 | Temp 98.7°F | Resp 16 | Ht 62.0 in | Wt 178.6 lb

## 2015-05-13 DIAGNOSIS — J0120 Acute ethmoidal sinusitis, unspecified: Secondary | ICD-10-CM | POA: Diagnosis not present

## 2015-05-13 MED ORDER — HYDROCODONE-HOMATROPINE 5-1.5 MG/5ML PO SYRP: ORAL_SOLUTION | ORAL | Status: DC

## 2015-05-13 MED ORDER — AMOXICILLIN-POT CLAVULANATE 875-125 MG PO TABS: 1.0000 | ORAL_TABLET | Freq: Two times a day (BID) | ORAL | Status: DC

## 2015-05-13 NOTE — Patient Instructions (Signed)
Try Mucinex D for congestion and Delsym for a non-sedating cough syrup.

## 2015-05-13 NOTE — Progress Notes (Signed)
Subjective:     Patient ID: Kerby NoraCynthia D Palmisano, female   DOB: 12/11/1970, 44 y.o.   MRN: [id number]  HPI  Chief Complaint  Patient presents with  . Cough    Patient comes in office today with concerns of cold like symptoms for the past 16 days. Patient reports productive cough of yellow/whitish phlegm, fever high of 100.6, sore throat, sinus pain and pressure and runny nose. Patient states that she took otc Dayquil for relief.   Patient reports increased sinus pressure, purulent sinus drainage, post nasal drainage and accompanying cough. Mild relief with steroid nasal spray and left over cough syrup.   Review of Systems     Objective:   Physical Exam  Constitutional: She appears well-developed and well-nourished. No distress.  Ears: T.M's intact without inflammation Sinuses: mild paranasal sinus tenderness Throat: no tonsillar enlargement or exudate Neck: no cervical adenopathy Lungs: clear     Assessment:    1. Acute ethmoidal sinusitis, recurrence not specified - amoxicillin-clavulanate (AUGMENTIN) 875-125 MG tablet; Take 1 tablet by mouth 2 (two) times daily.  Dispense: 20 tablet; Refill: 0 - HYDROcodone-homatropine (HYCODAN) 5-1.5 MG/5ML syrup; 5 ml 4-6 hours as needed for cough  Dispense: 240 mL; Refill: 0    Plan:   Discussed use of Mucinex D and Delysm.

## 2015-06-21 IMAGING — CT CT ABD-PELV W/ CM
2 of 5 series · 16 of 46 positions shown, 18 images · IV contrast (isovue)
Comparison: None.

CLINICAL DATA: Right lower quadrant pain.  Urinary frequency.

EXAM:
CT ABDOMEN AND PELVIS WITH CONTRAST
TECHNIQUE: Multidetector CT imaging of the abdomen and pelvis was performed
using the standard protocol following bolus administration of
intravenous contrast.
CONTRAST:  100 mL Isovue 370

[Series 2: routine abd pel with · axial · 0.71mm/px · z∈[-1064,-639]mm · 13 of 97 slices shown, 15 images]
[im 6/97  soft-tissue]
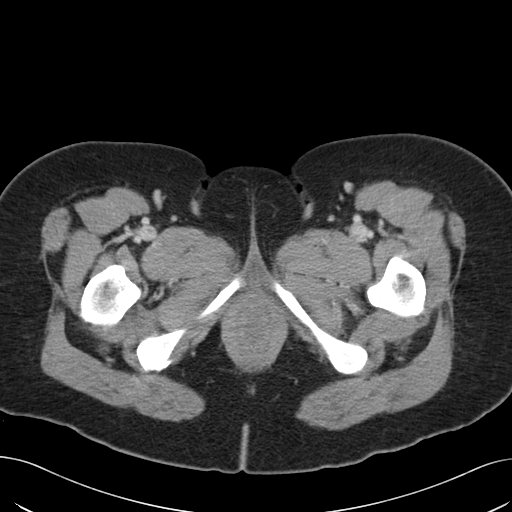
[im 6/97  bone]
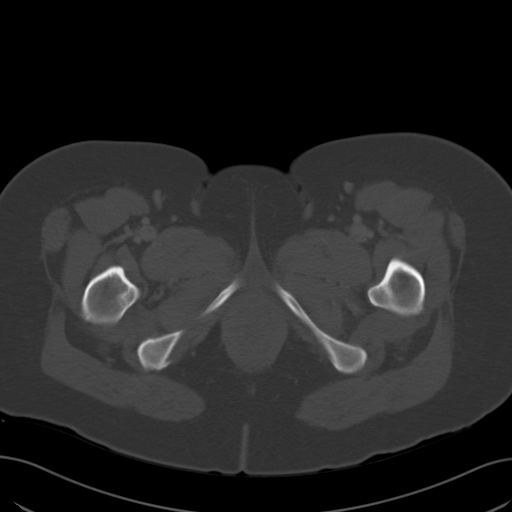
[im 11/97  soft-tissue]
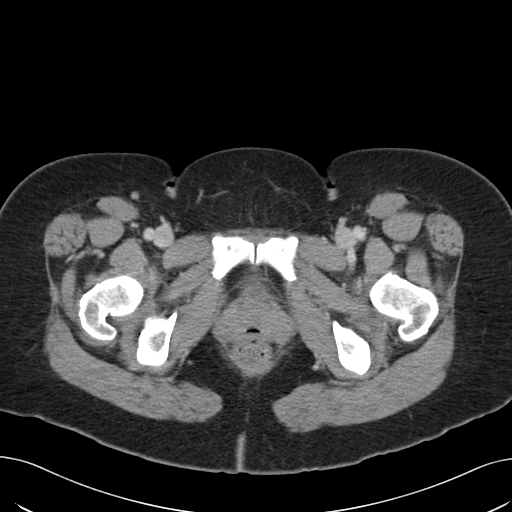
[im 22/97  soft-tissue]
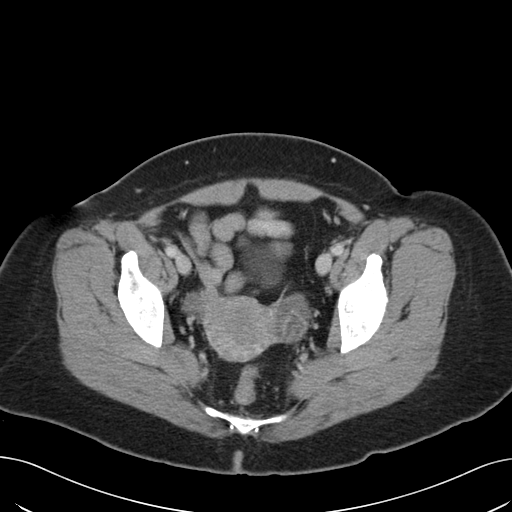
[im 27/97  soft-tissue]
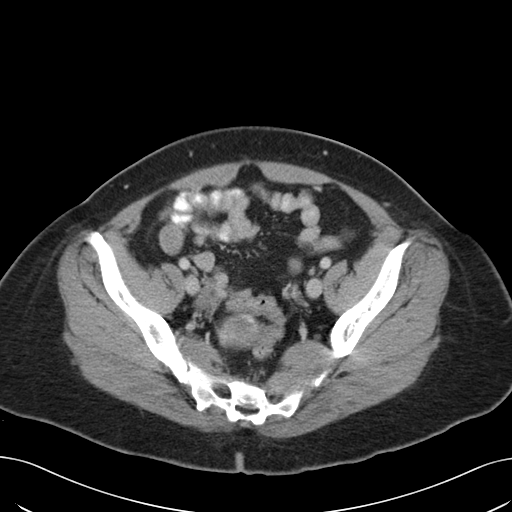
[im 33/97  soft-tissue]
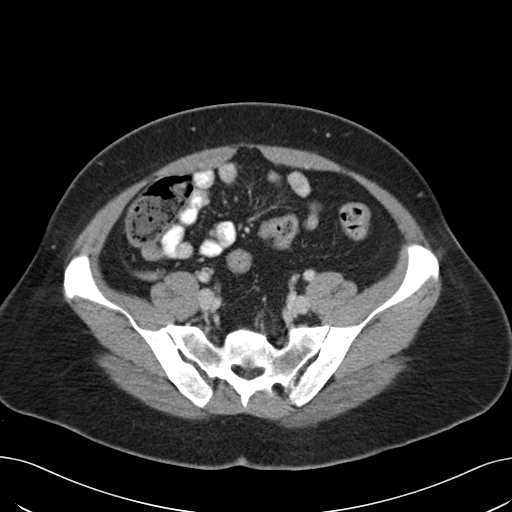
[im 43/97  soft-tissue]
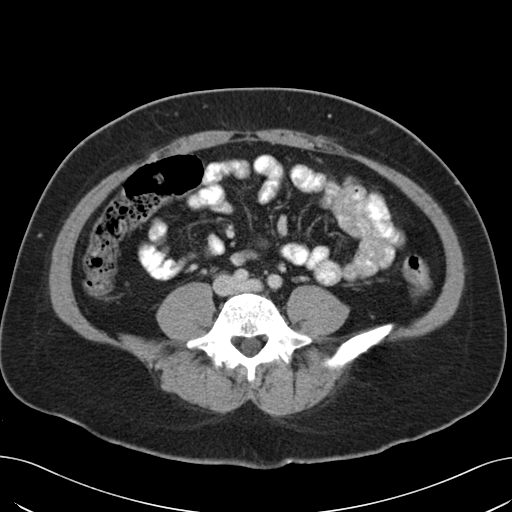
[im 49/97  soft-tissue]
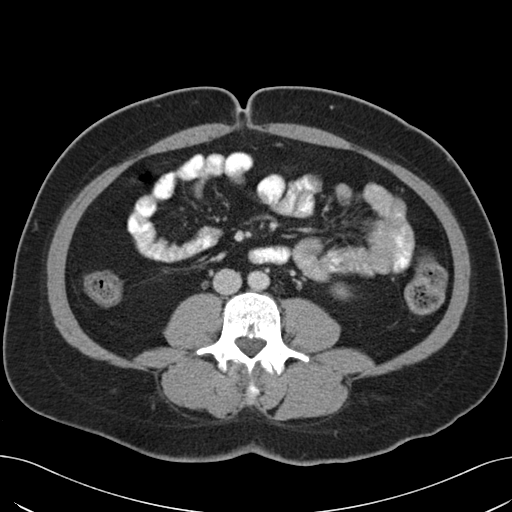
[im 54/97  soft-tissue]
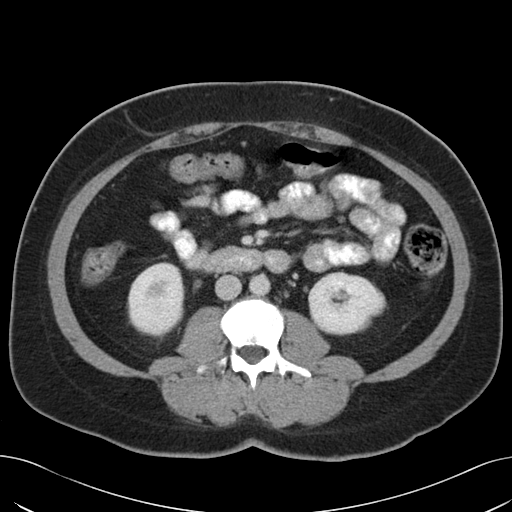
[im 65/97  soft-tissue]
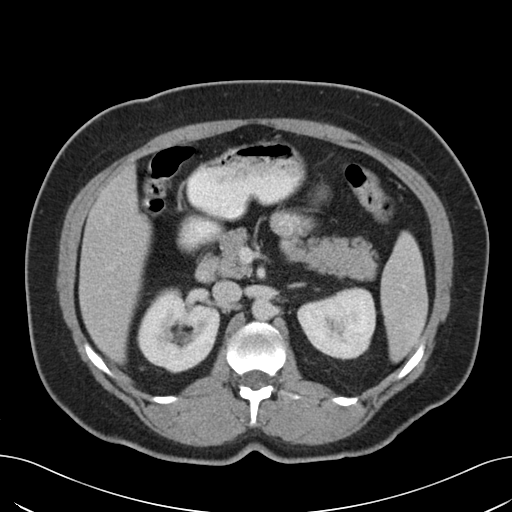
[im 65/97  bone]
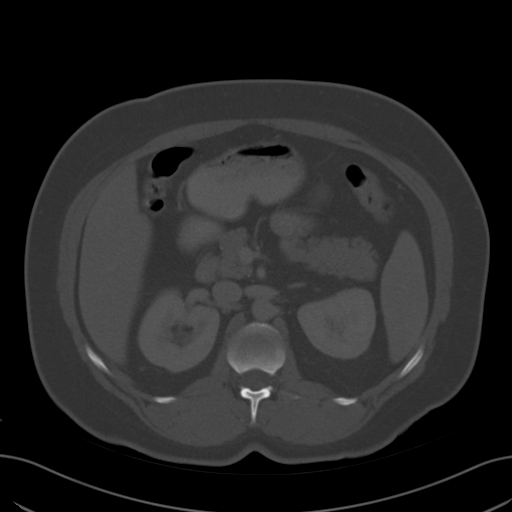
[im 70/97  soft-tissue]
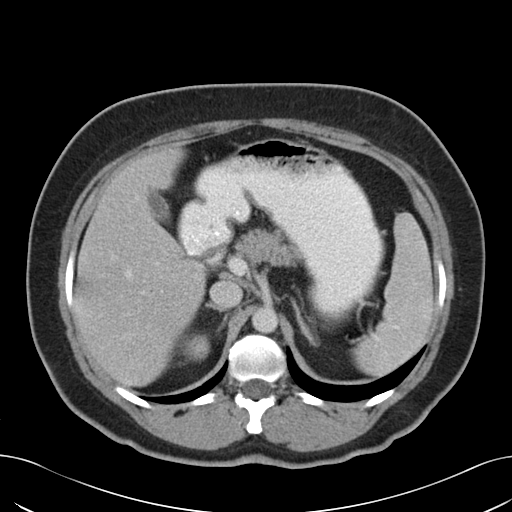
[im 75/97  soft-tissue]
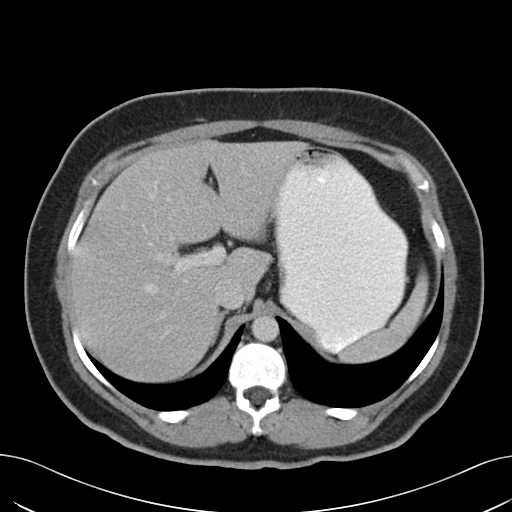
[im 86/97  soft-tissue]
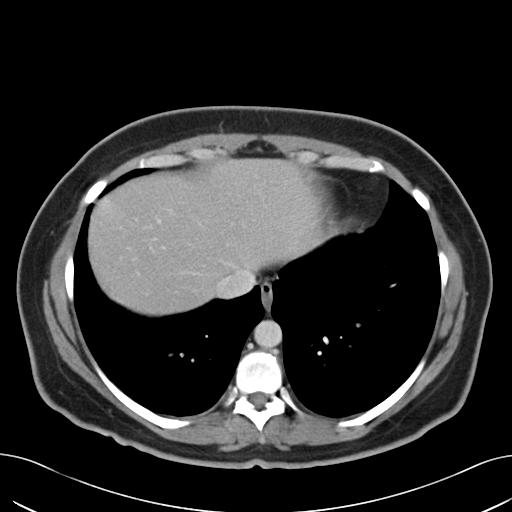
[im 91/97  soft-tissue]
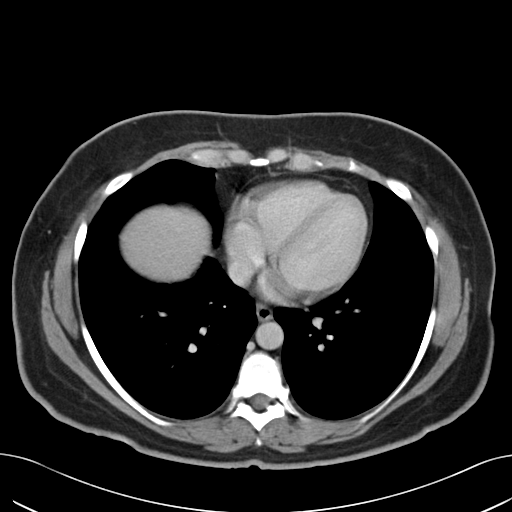

[Series 5: cor routine abd pel with · coronal · 0.74mm/px · 3 of 138 slices shown]
[im 46/138  soft-tissue]
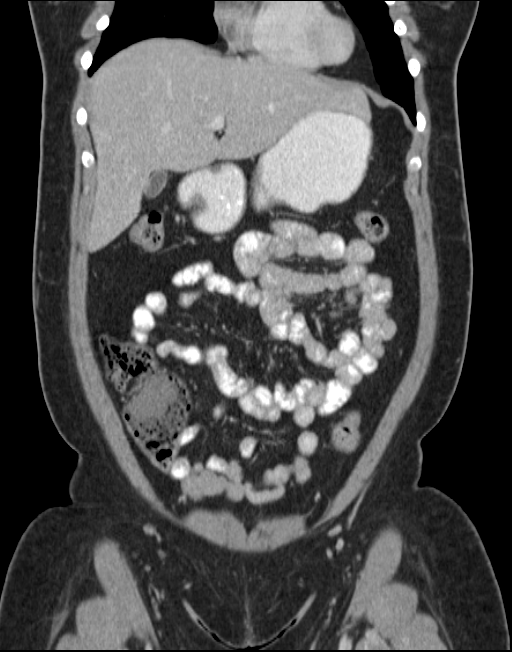
[im 61/138  soft-tissue]
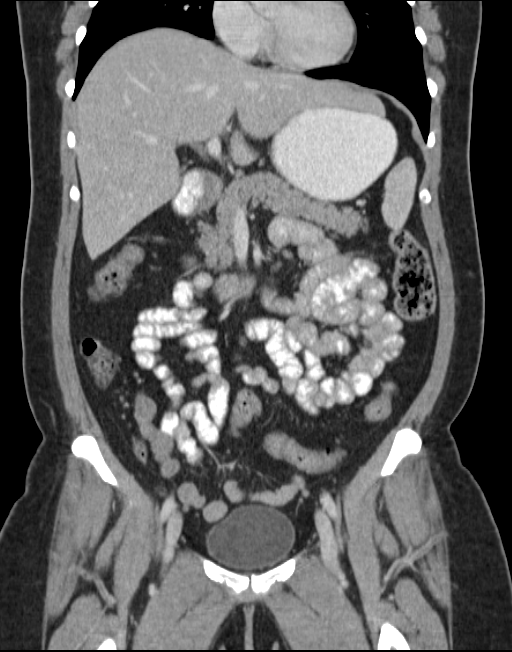
[im 77/138  soft-tissue]
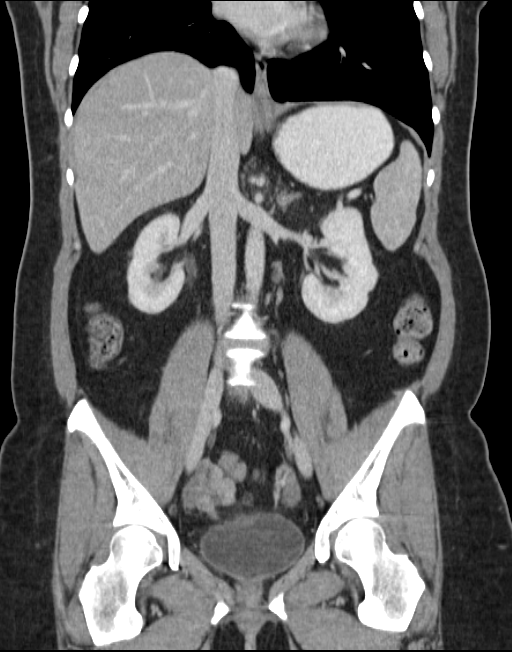

[16 of 46 positions shown; findings below may reference images not displayed]

FINDINGS: The distal appendix is prominent measuring approximately 8 mm in
maximum diameter. No significant periappendiceal inflammatory change
seen, and the proximal appendix is not enlarged. These findings are
equivocal for early appendicitis.

No other inflammatory process or abnormal fluid collections
identified within the abdomen or pelvis. No evidence of bowel wall
thickening or dilatation.

The liver, gallbladder, pancreas, spleen, adrenal glands, and
kidneys are normal in appearance. No evidence hydronephrosis. No
soft tissue masses or lymphadenopathy identified. Uterus and adnexal
regions are unremarkable. No suspicious bone lesions identified.
IMPRESSION: Findings which are equivocal for early distal appendicitis.
Recommend correlation with physical exam findings and continued
clinical observation ; consider followup by CT in 24 hr if
warranted.

## 2015-07-08 ENCOUNTER — Ambulatory Visit: Payer: Commercial Managed Care - HMO | Admitting: Physician Assistant

## 2015-07-09 ENCOUNTER — Encounter: Payer: Self-pay | Admitting: Physician Assistant

## 2015-07-09 ENCOUNTER — Ambulatory Visit (INDEPENDENT_AMBULATORY_CARE_PROVIDER_SITE_OTHER): Payer: Commercial Managed Care - HMO | Admitting: Physician Assistant

## 2015-07-09 VITALS — BP 116/72 | HR 88 | Temp 98.8°F | Resp 16 | Wt 178.8 lb

## 2015-07-09 DIAGNOSIS — E119 Type 2 diabetes mellitus without complications: Secondary | ICD-10-CM | POA: Diagnosis not present

## 2015-07-09 DIAGNOSIS — E7800 Pure hypercholesterolemia, unspecified: Secondary | ICD-10-CM | POA: Diagnosis not present

## 2015-07-09 NOTE — Progress Notes (Signed)
       Patient: Lisa Rivers Female    DOB: October 13, 1970   45 y.o.   MRN: [id number] Visit Date: 07/09/2015  Today's Provider: Margaretann LovelessJennifer M Anais Koenen, PA-C   Chief Complaint  Patient presents with  . Follow-up    Diabetes   Subjective:    HPI Patient Lisa Rivers is here for follow-up on Hypercholesterolemia and Type 2 Diabetes. Patient was seen 6 months ago and labs were stable.  She has been taking her metformin 1000mg  BID and simvastatin 20mg  once daily.  She reports no adverse reactions and states good compliance.     No Known Allergies Previous Medications   ALBUTEROL (PROAIR HFA) 108 (90 BASE) MCG/ACT INHALER    Inhale into the lungs as needed.   B COMPLEX VITAMINS CAPSULE    Take 1 capsule by mouth daily.   FLUTICASONE (FLONASE) 50 MCG/ACT NASAL SPRAY    Place 2 sprays into both nostrils daily.   METFORMIN (GLUCOPHAGE) 1000 MG TABLET    Take 1 tablet (1,000 mg total) by mouth 2 (two) times daily.   PEDIATRIC MULTIPLE VIT-C-FA PO    Take 1 tablet by mouth daily.   SIMVASTATIN (ZOCOR) 20 MG TABLET    take 1 tablet by mouth at bedtime    Review of Systems  Constitutional: Negative for fever, chills and fatigue.  Eyes: Negative for visual disturbance.  Respiratory: Negative.  Negative for cough and shortness of breath.   Cardiovascular: Negative for chest pain, palpitations and leg swelling.  Gastrointestinal: Negative for nausea, vomiting and abdominal pain.  Endocrine: Negative for polyuria.  Genitourinary: Negative.  Negative for urgency.  Neurological: Negative for dizziness, light-headedness, numbness and headaches.  Psychiatric/Behavioral: Negative for agitation.  All other systems reviewed and are negative.   Social History  Substance Use Topics  . Smoking status: Never Smoker   . Smokeless tobacco: Never Used  . Alcohol Use: No   Objective:   BP 116/72 mmHg  Pulse 88  Temp(Src) 98.8 F (37.1 C) (Oral)  Resp 16  Wt 178 lb 12.8 oz (81.103 kg)  LMP  06/28/2015  Physical Exam  Constitutional: She appears well-developed and well-nourished. No distress.  Neck: Normal range of motion. Neck supple. No tracheal deviation present. No thyromegaly present.  Cardiovascular: Normal rate, regular rhythm and normal heart sounds.  Exam reveals no gallop and no friction rub.   No murmur heard. Pulmonary/Chest: Effort normal and breath sounds normal. No respiratory distress. She has no wheezes. She has no rales.  Lymphadenopathy:    She has no cervical adenopathy.  Skin: She is not diaphoretic.  Vitals reviewed.       Assessment & Plan:     1. Type 2 diabetes mellitus without complication, unspecified long term insulin use status (HCC) Microalbumin is normal today in the office.  Will check HgBA1c.  If still WNL may consider decreasing metformin dose.  Will f/u pending lab results. Will see back in 6 months for CPE.  She is to call the office in the meantime if symptoms fail to improve or worsen. - POCT UA - Microalbumin - Hemoglobin A1c  2. Hypercholesterolemia without hypertriglyceridemia Currently stable on simvastatin 20mg .  Will recheck lipid panel.  F/u pending lab results or in 6 months with CPE if labs are stable. - Lipid panel       Margaretann LovelessJennifer M Maressa Apollo, PA-C  [facility]

## 2015-07-25 ENCOUNTER — Telehealth: Payer: Self-pay

## 2015-07-25 LAB — LIPID PANEL
CHOLESTEROL TOTAL: 133 mg/dL (ref 100–199)
Chol/HDL Ratio: 3 ratio units (ref 0.0–4.4)
HDL: 45 mg/dL (ref >39)
LDL Calculated: 65 mg/dL (ref 0–99)
TRIGLYCERIDES: 117 mg/dL (ref 0–149)
VLDL Cholesterol Cal: 23 mg/dL (ref 5–40)

## 2015-07-25 LAB — HEMOGLOBIN A1C
Est. average glucose Bld gHb Est-mCnc: 126 mg/dL
HEMOGLOBIN A1C: 6 % — AB (ref 4.8–5.6)

## 2015-07-25 NOTE — Telephone Encounter (Signed)
-----   Message from Margaretann Loveless, [place] sent at 07/25/2015  8:59 AM EST ----- Cholesterol readings are WNL and stable from 6 months ago.  HgBA1c dropped from 6.1 to 6.0.  Continue working with diet and exercise. We can recheck at next complete annual exam.

## 2015-07-25 NOTE — Telephone Encounter (Signed)
Patient advised as directed below.  Thanks,  -Joseline 

## 2015-08-03 ENCOUNTER — Other Ambulatory Visit: Payer: Self-pay | Admitting: Physician Assistant

## 2015-10-01 ENCOUNTER — Ambulatory Visit (INDEPENDENT_AMBULATORY_CARE_PROVIDER_SITE_OTHER): Payer: Commercial Managed Care - HMO | Admitting: Physician Assistant

## 2015-10-01 ENCOUNTER — Encounter: Payer: Self-pay | Admitting: Physician Assistant

## 2015-10-01 VITALS — BP 120/80 | HR 99 | Temp 98.4°F | Resp 16 | Wt 181.0 lb

## 2015-10-01 DIAGNOSIS — J0120 Acute ethmoidal sinusitis, unspecified: Secondary | ICD-10-CM

## 2015-10-01 DIAGNOSIS — L237 Allergic contact dermatitis due to plants, except food: Secondary | ICD-10-CM

## 2015-10-01 MED ORDER — AMOXICILLIN-POT CLAVULANATE 875-125 MG PO TABS: 1.0000 | ORAL_TABLET | Freq: Two times a day (BID) | ORAL | Status: DC

## 2015-10-01 MED ORDER — PREDNISONE 10 MG (21) PO TBPK: ORAL_TABLET | ORAL | Status: DC

## 2015-10-01 NOTE — Progress Notes (Signed)
Patient: Lisa Rivers Female    DOB: [birth year]   45 y.o.   MRN: [id number] Visit Date: 10/01/2015  Today's Provider: Margaretann LovelessJennifer M Burnette, PA-C   Chief Complaint  Patient presents with  . URI   Subjective:    URI  This is a new problem. The current episode started 1 to 4 weeks ago (2 weeks ago). The problem has been gradually worsening. The maximum temperature recorded prior to her arrival was 101 - 101.9 F (started last night and it was 101.2 ). Associated symptoms include congestion, coughing, headaches, a plugged ear sensation, rhinorrhea, sneezing and a sore throat (a little). Pertinent negatives include no abdominal pain, chest pain, diarrhea ("diarrhea always because of the Metformin"), dysuria, ear pain, joint pain, joint swelling, nausea, rash, sinus pain, swollen glands, vomiting or wheezing. Associated symptoms comments: Per patient her grandson had the flu.. She has tried increased fluids (Dayquil, Nasal spray and allergies medication.) for the symptoms. The treatment provided no relief.  She also states her grandson was diagnosed with the flu but she does not feel this is the flu. She has had recurrent sinus infections and feels this is similar.     No Known Allergies Previous Medications   ALBUTEROL (PROAIR HFA) 108 (90 BASE) MCG/ACT INHALER    Inhale into the lungs as needed.   B COMPLEX VITAMINS CAPSULE    Take 1 capsule by mouth daily.   FLUTICASONE (FLONASE) 50 MCG/ACT NASAL SPRAY    Place 2 sprays into both nostrils daily.   METFORMIN (GLUCOPHAGE) 1000 MG TABLET    take 1 tablet by mouth twice a day   PEDIATRIC MULTIPLE VIT-C-FA PO    Take 1 tablet by mouth daily.   SIMVASTATIN (ZOCOR) 20 MG TABLET    take 1 tablet by mouth at bedtime    Review of Systems  Constitutional: Positive for fever, chills and fatigue.  HENT: Positive for congestion, rhinorrhea, sinus pressure, sneezing and sore throat (a little). Negative for ear pain and trouble swallowing.     Respiratory: Positive for cough. Negative for chest tightness, shortness of breath and wheezing.   Cardiovascular: Negative for chest pain, palpitations and leg swelling.  Gastrointestinal: Negative for nausea, vomiting, abdominal pain and diarrhea ("diarrhea always because of the Metformin").  Genitourinary: Negative for dysuria.  Musculoskeletal: Negative for joint pain.  Skin: Negative for rash.  Neurological: Positive for headaches. Negative for dizziness.    Social History  Substance Use Topics  . Smoking status: Never Smoker   . Smokeless tobacco: Never Used  . Alcohol Use: No   Objective:   BP 120/80 mmHg  Pulse 99  Temp(Src) 98.4 F (36.9 C) (Oral)  Resp 16  Wt 181 lb (82.101 kg)  SpO2 98%  LMP 09/20/2015  Physical Exam  Constitutional: She appears well-developed and well-nourished. No distress.  HENT:  Head: Normocephalic and atraumatic.  Right Ear: Hearing, external ear and ear canal normal. Tympanic membrane is not erythematous and not bulging. A middle ear effusion is present.  Left Ear: Hearing, external ear and ear canal normal. Tympanic membrane is not erythematous and not bulging. A middle ear effusion is present.  Nose: Mucosal edema and rhinorrhea present. Right sinus exhibits maxillary sinus tenderness. Right sinus exhibits no frontal sinus tenderness. Left sinus exhibits maxillary sinus tenderness. Left sinus exhibits no frontal sinus tenderness.  Mouth/Throat: Uvula is midline, oropharynx is clear and moist and mucous membranes are normal. No oropharyngeal exudate, posterior oropharyngeal edema  or posterior oropharyngeal erythema.  Ethmoidal sinus pressure and tenderness  Neck: Normal range of motion. Neck supple. No tracheal deviation present. No thyromegaly present.  Cardiovascular: Normal rate, regular rhythm and normal heart sounds.  Exam reveals no gallop and no friction rub.   No murmur heard. Pulmonary/Chest: Effort normal and breath sounds normal.  No stridor. No respiratory distress. She has no wheezes. She has no rales.  Lymphadenopathy:    She has no cervical adenopathy.  Skin: Rash noted. Rash is macular. She is not diaphoretic.     Vitals reviewed.       Assessment & Plan:     1. Acute ethmoidal sinusitis, recurrence not specified Worsening symptoms that has not responded to OTC medications. Will give augmentin as below. She may continue dayquil for congestion.  She also needs to continue her allergy medications. She needs to stay well hydrated and get plenty of rest. She is to call if symptoms worsen or fail to improve. - amoxicillin-clavulanate (AUGMENTIN) 875-125 MG tablet; Take 1 tablet by mouth 2 (two) times daily.  Dispense: 20 tablet; Refill: 0  2. Contact dermatitis due to poison oak Worsening contact dermatitis that has not improved with OTC treatments. Will give prednisone as below. She is to call if symptoms fail to improve or worsen. - predniSONE (STERAPRED UNI-PAK 21 TAB) 10 MG (21) TBPK tablet; Take as directed on package directions  Dispense: 21 tablet; Refill: 0       Margaretann Loveless, PA-C  [facility]

## 2015-10-01 NOTE — Patient Instructions (Signed)

## 2015-10-04 ENCOUNTER — Other Ambulatory Visit: Payer: Self-pay | Admitting: Family Medicine

## 2016-01-12 ENCOUNTER — Ambulatory Visit (INDEPENDENT_AMBULATORY_CARE_PROVIDER_SITE_OTHER): Payer: Commercial Managed Care - HMO | Admitting: Physician Assistant

## 2016-01-12 ENCOUNTER — Encounter: Payer: Self-pay | Admitting: Physician Assistant

## 2016-01-12 VITALS — BP 128/80 | HR 96 | Temp 98.5°F | Resp 16 | Ht 62.0 in | Wt 179.0 lb

## 2016-01-12 DIAGNOSIS — Z1239 Encounter for other screening for malignant neoplasm of breast: Secondary | ICD-10-CM

## 2016-01-12 DIAGNOSIS — Z0000 Encounter for general adult medical examination without abnormal findings: Secondary | ICD-10-CM

## 2016-01-12 DIAGNOSIS — E119 Type 2 diabetes mellitus without complications: Secondary | ICD-10-CM | POA: Diagnosis not present

## 2016-01-12 DIAGNOSIS — E7800 Pure hypercholesterolemia, unspecified: Secondary | ICD-10-CM | POA: Diagnosis not present

## 2016-01-12 NOTE — Patient Instructions (Signed)
Health Maintenance, Female Adopting a healthy lifestyle and getting preventive care can go a long way to promote health and wellness. Talk with your health care provider about what schedule of regular examinations is right for you. This is a good chance for you to check in with your provider about disease prevention and staying healthy. In between checkups, there are plenty of things you can do on your own. Experts have done a lot of research about which lifestyle changes and preventive measures are most likely to keep you healthy. Ask your health care provider for more information. WEIGHT AND DIET  Eat a healthy diet  Be sure to include plenty of vegetables, fruits, low-fat dairy products, and lean protein.  Do not eat a lot of foods high in solid fats, added sugars, or salt.  Get regular exercise. This is one of the most important things you can do for your health.  Most adults should exercise for at least 150 minutes each week. The exercise should increase your heart rate and make you sweat (moderate-intensity exercise).  Most adults should also do strengthening exercises at least twice a week. This is in addition to the moderate-intensity exercise.  Maintain a healthy weight  Body mass index (BMI) is a measurement that can be used to identify possible weight problems. It estimates body fat based on height and weight. Your health care provider can help determine your BMI and help you achieve or maintain a healthy weight.  For females 20 years of age and older:   A BMI below 18.5 is considered underweight.  A BMI of 18.5 to 24.9 is normal.  A BMI of 25 to 29.9 is considered overweight.  A BMI of 30 and above is considered obese.  Watch levels of cholesterol and blood lipids  You should start having your blood tested for lipids and cholesterol at 47 years of age, then have this test every 5 years.  You may need to have your cholesterol levels checked more often if:  Your lipid  or cholesterol levels are high.  You are older than 47 years of age.  You are at high risk for heart disease.  CANCER SCREENING   Lung Cancer  Lung cancer screening is recommended for adults 55-80 years old who are at high risk for lung cancer because of a history of smoking.  A yearly low-dose CT scan of the lungs is recommended for people who:  Currently smoke.  Have quit within the past 15 years.  Have at least a 30-pack-year history of smoking. A pack year is smoking an average of one pack of cigarettes a day for 1 year.  Yearly screening should continue until it has been 15 years since you quit.  Yearly screening should stop if you develop a health problem that would prevent you from having lung cancer treatment.  Breast Cancer  Practice breast self-awareness. This means understanding how your breasts normally appear and feel.  It also means doing regular breast self-exams. Let your health care provider know about any changes, no matter how small.  If you are in your 20s or 30s, you should have a clinical breast exam (CBE) by a health care provider every 1-3 years as part of a regular health exam.  If you are 40 or older, have a CBE every year. Also consider having a breast X-ray (mammogram) every year.  If you have a family history of breast cancer, talk to your health care provider about genetic screening.  If you   are at high risk for breast cancer, talk to your health care provider about having an MRI and a mammogram every year.  Breast cancer gene (BRCA) assessment is recommended for women who have family members with BRCA-related cancers. BRCA-related cancers include:  Breast.  Ovarian.  Tubal.  Peritoneal cancers.  Results of the assessment will determine the need for genetic counseling and BRCA1 and BRCA2 testing. Cervical Cancer Your health care provider may recommend that you be screened regularly for cancer of the pelvic organs (ovaries, uterus, and  vagina). This screening involves a pelvic examination, including checking for microscopic changes to the surface of your cervix (Pap test). You may be encouraged to have this screening done every 3 years, beginning at age 21.  For women ages 30-65, health care providers may recommend pelvic exams and Pap testing every 3 years, or they may recommend the Pap and pelvic exam, combined with testing for human papilloma virus (HPV), every 5 years. Some types of HPV increase your risk of cervical cancer. Testing for HPV may also be done on women of any age with unclear Pap test results.  Other health care providers may not recommend any screening for nonpregnant women who are considered low risk for pelvic cancer and who do not have symptoms. Ask your health care provider if a screening pelvic exam is right for you.  If you have had past treatment for cervical cancer or a condition that could lead to cancer, you need Pap tests and screening for cancer for at least 20 years after your treatment. If Pap tests have been discontinued, your risk factors (such as having a new sexual partner) need to be reassessed to determine if screening should resume. Some women have medical problems that increase the chance of getting cervical cancer. In these cases, your health care provider may recommend more frequent screening and Pap tests. Colorectal Cancer  This type of cancer can be detected and often prevented.  Routine colorectal cancer screening usually begins at 47 years of age and continues through 47 years of age.  Your health care provider may recommend screening at an earlier age if you have risk factors for colon cancer.  Your health care provider may also recommend using home test kits to check for hidden blood in the stool.  A small camera at the end of a tube can be used to examine your colon directly (sigmoidoscopy or colonoscopy). This is done to check for the earliest forms of colorectal  cancer.  Routine screening usually begins at age 50.  Direct examination of the colon should be repeated every 5-10 years through 47 years of age. However, you may need to be screened more often if early forms of precancerous polyps or small growths are found. Skin Cancer  Check your skin from head to toe regularly.  Tell your health care provider about any new moles or changes in moles, especially if there is a change in a mole's shape or color.  Also tell your health care provider if you have a mole that is larger than the size of a pencil eraser.  Always use sunscreen. Apply sunscreen liberally and repeatedly throughout the day.  Protect yourself by wearing long sleeves, pants, a wide-brimmed hat, and sunglasses whenever you are outside. HEART DISEASE, DIABETES, AND HIGH BLOOD PRESSURE   High blood pressure causes heart disease and increases the risk of stroke. High blood pressure is more likely to develop in:  People who have blood pressure in the high end   of the normal range (130-139/85-89 mm Hg).  People who are overweight or obese.  People who are African American.  If you are 38-23 years of age, have your blood pressure checked every 3-5 years. If you are 61 years of age or older, have your blood pressure checked every year. You should have your blood pressure measured twice--once when you are at a hospital or clinic, and once when you are not at a hospital or clinic. Record the average of the two measurements. To check your blood pressure when you are not at a hospital or clinic, you can use:  An automated blood pressure machine at a pharmacy.  A home blood pressure monitor.  If you are between 45 years and 39 years old, ask your health care provider if you should take aspirin to prevent strokes.  Have regular diabetes screenings. This involves taking a blood sample to check your fasting blood sugar level.  If you are at a normal weight and have a low risk for diabetes,  have this test once every three years after 47 years of age.  If you are overweight and have a high risk for diabetes, consider being tested at a younger age or more often. PREVENTING INFECTION  Hepatitis B  If you have a higher risk for hepatitis B, you should be screened for this virus. You are considered at high risk for hepatitis B if:  You were born in a country where hepatitis B is common. Ask your health care provider which countries are considered high risk.  Your parents were born in a high-risk country, and you have not been immunized against hepatitis B (hepatitis B vaccine).  You have HIV or AIDS.  You use needles to inject street drugs.  You live with someone who has hepatitis B.  You have had sex with someone who has hepatitis B.  You get hemodialysis treatment.  You take certain medicines for conditions, including cancer, organ transplantation, and autoimmune conditions. Hepatitis C  Blood testing is recommended for:  Everyone born from [birth year] through 1967.  Anyone with known risk factors for hepatitis C. Sexually transmitted infections (STIs)  You should be screened for sexually transmitted infections (STIs) including gonorrhea and chlamydia if:  You are sexually active and are younger than 47 years of age.  You are older than 47 years of age and your health care provider tells you that you are at risk for this type of infection.  Your sexual activity has changed since you were last screened and you are at an increased risk for chlamydia or gonorrhea. Ask your health care provider if you are at risk.  If you do not have HIV, but are at risk, it may be recommended that you take a prescription medicine daily to prevent HIV infection. This is called pre-exposure prophylaxis (PrEP). You are considered at risk if:  You are sexually active and do not regularly use condoms or know the HIV status of your partner(s).  You take drugs by injection.  You are sexually  active with a partner who has HIV. Talk with your health care provider about whether you are at high risk of being infected with HIV. If you choose to begin PrEP, you should first be tested for HIV. You should then be tested every 3 months for as long as you are taking PrEP.  PREGNANCY   If you are premenopausal and you may become pregnant, ask your health care provider about preconception counseling.  If you may  become pregnant, take 400 to 800 micrograms (mcg) of folic acid every day.  If you want to prevent pregnancy, talk to your health care provider about birth control (contraception). OSTEOPOROSIS AND MENOPAUSE   Osteoporosis is a disease in which the bones lose minerals and strength with aging. This can result in serious bone fractures. Your risk for osteoporosis can be identified using a bone density scan.  If you are 61 years of age or older, or if you are at risk for osteoporosis and fractures, ask your health care provider if you should be screened.  Ask your health care provider whether you should take a calcium or vitamin D supplement to lower your risk for osteoporosis.  Menopause may have certain physical symptoms and risks.  Hormone replacement therapy may reduce some of these symptoms and risks. Talk to your health care provider about whether hormone replacement therapy is right for you.  HOME CARE INSTRUCTIONS   Schedule regular health, dental, and eye exams.  Stay current with your immunizations.   Do not use any tobacco products including cigarettes, chewing tobacco, or electronic cigarettes.  If you are pregnant, do not drink alcohol.  If you are breastfeeding, limit how much and how often you drink alcohol.  Limit alcohol intake to no more than 1 drink per day for nonpregnant women. One drink equals 12 ounces of beer, 5 ounces of wine, or 1 ounces of hard liquor.  Do not use street drugs.  Do not share needles.  Ask your health care provider for help if  you need support or information about quitting drugs.  Tell your health care provider if you often feel depressed.  Tell your health care provider if you have ever been abused or do not feel safe at home.   This information is not intended to replace advice given to you by your health care provider. Make sure you discuss any questions you have with your health care provider.   Document Released: 01/04/2011 Document Revised: 07/12/2014 Document Reviewed: 05/23/2013 Elsevier Interactive Patient Education [employer].

## 2016-01-12 NOTE — Progress Notes (Signed)
Patient: Lisa Rivers, Female    DOB: 05/03/1971, 45 y.o.   MRN: [id number] Visit Date: 01/12/2016  Today's Provider: Margaretann Loveless, PA-C   Chief Complaint  Patient presents with  . Annual Exam   Subjective:    Annual physical exam Lisa Rivers is a 45 y.o. female who presents today for health maintenance and complete physical. She feels well. She reports exercising walks daily. She reports she is sleeping fairly well.  Eye exam:04/02/2015 Foot Exam:07/09/2015 Pap:01/03/15 Normal Tdap:08/20/2010 -----------------------------------------------------------------   Review of Systems  Constitutional: Negative.   HENT: Negative.   Eyes: Negative.   Respiratory: Negative.   Cardiovascular: Negative.   Gastrointestinal: Negative.   Endocrine: Negative.   Genitourinary: Negative.   Musculoskeletal: Negative.   Skin: Negative.   Allergic/Immunologic: Positive for environmental allergies.  Neurological: Negative.   Hematological: Negative.   Psychiatric/Behavioral: Negative.     Social History      She  reports that she has never smoked. She has never used smokeless tobacco. She reports that she does not drink alcohol or use illicit drugs.       Social History   Social History  . Marital Status: Married    Spouse Name: Thayer Ohm  . Number of Children: 0  . Years of Education: 12   Occupational History  . full time-[place]    Social History Main Topics  . Smoking status: Never Smoker   . Smokeless tobacco: Never Used  . Alcohol Use: No  . Drug Use: No  . Sexual Activity: Not Asked   Other Topics Concern  . None   Social History Narrative    Past Medical History  Diagnosis Date  . Hyperlipidemia   . Diabetes mellitus without complication [facility])      Patient Active Problem List   Diagnosis Date Noted  . Allergic rhinitis 02/06/2015  . Abortion, spontaneous 12/30/2014  . Abortion 12/30/2014  . Carpal tunnel syndrome  12/30/2014  . Diabetes (HCC) 12/30/2014  . Hypercholesterolemia without hypertriglyceridemia 12/30/2014  . Fritzi Mandes 12/30/2014  . Avitaminosis D 12/30/2014    Past Surgical History  Procedure Laterality Date  . Appendectomy  12/2013  . Foot surgery  [id number]    BOTH FEET    Family History        Family Status  Relation Status Death Age  . Mother Deceased 38  . Father Deceased 73  . Brother Alive   . Sister Alive   . Brother Deceased 18    shot himself  . Brother Deceased     shot himself  . Brother Deceased     complications from Hepatitis C  . Brother Alive         Her family history includes Alcohol abuse in her father and mother; Coronary artery disease in her mother; Diabetes in her brother and father; Heart disease in her mother.    No Known Allergies  Current Meds  Medication Sig  . albuterol (PROAIR HFA) 108 (90 BASE) MCG/ACT inhaler Inhale into the lungs as needed.  Marland Kitchen b complex vitamins capsule Take 1 capsule by mouth daily.  . fluticasone (FLONASE) 50 MCG/ACT nasal spray Place 2 sprays into both nostrils daily.  . metFORMIN (GLUCOPHAGE) 1000 MG tablet take 1 tablet by mouth twice a day  . PEDIATRIC MULTIPLE VIT-C-FA PO Take 1 tablet by mouth daily.  . simvastatin (ZOCOR) 20 MG tablet take 1 tablet by mouth at bedtime    Patient Care  Team: Margaretann Loveless, PA-C as PCP - General (Physician Assistant)     Objective:   Vitals: BP 128/80 mmHg  Pulse 96  Temp(Src) 98.5 F (36.9 C) (Oral)  Resp 16  Ht  (1.575 m)  Wt 179 lb (81.194 kg)  BMI 32.73 kg/m2  LMP 12/18/2015   Physical Exam  Constitutional: She is oriented to person, place, and time. She appears well-developed and well-nourished. No distress.  HENT:  Head: Normocephalic and atraumatic.  Right Ear: Hearing, tympanic membrane, external ear and ear canal normal.  Left Ear: Hearing, tympanic membrane, external ear and ear canal normal.  Nose: Nose normal.  Mouth/Throat: Uvula  is midline, oropharynx is clear and moist and mucous membranes are normal. No oropharyngeal exudate.  Eyes: Conjunctivae and EOM are normal. Pupils are equal, round, and reactive to light. Right eye exhibits no discharge. Left eye exhibits no discharge. No scleral icterus.  Neck: Normal range of motion. Neck supple. No JVD present. Carotid bruit is not present. No tracheal deviation present. No thyromegaly present.  Cardiovascular: Normal rate, regular rhythm, normal heart sounds and intact distal pulses.  Exam reveals no gallop and no friction rub.   No murmur heard. Pulmonary/Chest: Effort normal and breath sounds normal. No respiratory distress. She has no wheezes. She has no rales. She exhibits no tenderness. Right breast exhibits no inverted nipple, no mass, no nipple discharge, no skin change and no tenderness. Left breast exhibits no inverted nipple, no mass, no nipple discharge, no skin change and no tenderness. Breasts are symmetrical.  Abdominal: Soft. Bowel sounds are normal. She exhibits no distension and no mass. There is no tenderness. There is no rebound and no guarding. Hernia confirmed negative in the right inguinal area and confirmed negative in the left inguinal area.  Genitourinary: Rectum normal, vagina normal and uterus normal. No breast swelling, tenderness, discharge or bleeding. Pelvic exam was performed with patient supine. There is no rash, tenderness, lesion or injury on the right labia. There is no rash, tenderness, lesion or injury on the left labia. Right adnexum displays no mass, no tenderness and no fullness. Left adnexum displays no mass, no tenderness and no fullness. No erythema, tenderness or bleeding in the vagina. No signs of injury around the vagina. No vaginal discharge found.  Musculoskeletal: Normal range of motion. She exhibits no edema or tenderness.  Lymphadenopathy:    She has no cervical adenopathy.       Right: No inguinal adenopathy present.       Left:  No inguinal adenopathy present.  Neurological: She is alert and oriented to person, place, and time. She has normal reflexes. No cranial nerve deficit. Coordination normal.  Skin: Skin is warm and dry. No rash noted. She is not diaphoretic.  Psychiatric: She has a normal mood and affect. Her behavior is normal. Judgment and thought content normal.  Vitals reviewed.   Depression Screen No flowsheet data found.    Assessment & Plan:     Routine Health Maintenance and Physical Exam  Exercise Activities and Dietary recommendations Goals    None      Immunization History  Administered Date(s) Administered  . Tdap 08/20/2010    Health Maintenance  Topic Date Due  . PNEUMOCOCCAL POLYSACCHARIDE VACCINE (1) 05/23/1971  . OPHTHALMOLOGY EXAM  05/23/1979  . HIV Screening  05/22/1984  . URINE MICROALBUMIN  01/03/2016  . HEMOGLOBIN A1C  01/21/2016  . INFLUENZA VACCINE  02/03/2016  . FOOT EXAM  07/08/2016  . PAP  SMEAR  01/02/2018  . TETANUS/TDAP  08/20/2020      Discussed health benefits of physical activity, and encouraged her to engage in regular exercise appropriate for her age and condition.   1. Annual physical exam Normal physical exam today. Will check labs as below and f/u pending lab results. If labs are stable and WNL she will not need to have these rechecked for one year at her next annual physical exam. She is to call the office in the meantime if she has any acute issue, questions or concerns. - CBC with Differential/Platelet - Comprehensive metabolic panel - TSH  2. Breast cancer screening Breast exam today was normal. There is no family history of breast cancer. She does perform regular self breast exams. Mammogram was ordered as below. [facility] was given to patient so she may schedule her mammogram at her convenience. - MM DIGITAL SCREENING BILATERAL; Future  3. Type 2 diabetes mellitus without complication, unspecified long term  insulin use status (HCC) Will check labs as below and f/u pending results. - Hemoglobin A1c  4. Hypercholesterolemia without hypertriglyceridemia Will check labs as below and f/u pending results. - Lipid panel  The entirety of the information documented in the History of Present Illness, Review of Systems and Physical Exam were personally obtained by me. Portions of this information were initially documented by Hetty ElyJoseline Rosas, CMA and reviewed by me for thoroughness and accuracy. --------------------------------------------------------------------    Margaretann LovelessJennifer M Garrus Gauthreaux, PA-C  [facility]

## 2016-01-19 ENCOUNTER — Telehealth: Payer: Self-pay

## 2016-01-19 DIAGNOSIS — L237 Allergic contact dermatitis due to plants, except food: Secondary | ICD-10-CM

## 2016-01-19 MED ORDER — PREDNISONE 10 MG (21) PO TBPK: ORAL_TABLET | ORAL | Status: DC

## 2016-01-19 NOTE — Telephone Encounter (Signed)
Pt reports having another outbreak for poison ivy/oak and would like an RX sent to [facility] [address] to help with this.   Thanks,   -Vernona RiegerLaura

## 2016-01-19 NOTE — Telephone Encounter (Signed)
Rx sent 

## 2016-02-13 LAB — COMPREHENSIVE METABOLIC PANEL
A/G RATIO: 1.8 (ref 1.2–2.2)
ALBUMIN: 4.2 g/dL (ref 3.5–5.5)
ALK PHOS: 45 IU/L (ref 39–117)
ALT: 30 IU/L (ref 0–32)
AST: 25 IU/L (ref 0–40)
BUN / CREAT RATIO: 8 — AB (ref 9–23)
BUN: 7 mg/dL (ref 6–24)
Bilirubin Total: 0.4 mg/dL (ref 0.0–1.2)
CO2: 23 mmol/L (ref 18–29)
CREATININE: 0.83 mg/dL (ref 0.57–1.00)
Calcium: 9.3 mg/dL (ref 8.7–10.2)
Chloride: 101 mmol/L (ref 96–106)
GFR calc Af Amer: 98 mL/min/{1.73_m2} (ref >59)
GFR calc non Af Amer: 85 mL/min/{1.73_m2} (ref >59)
GLOBULIN, TOTAL: 2.4 g/dL (ref 1.5–4.5)
Glucose: 116 mg/dL — ABNORMAL HIGH (ref 65–99)
POTASSIUM: 4 mmol/L (ref 3.5–5.2)
SODIUM: 141 mmol/L (ref 134–144)
Total Protein: 6.6 g/dL (ref 6.0–8.5)

## 2016-02-13 LAB — TSH: TSH: 2.48 u[IU]/mL (ref 0.450–4.500)

## 2016-02-13 LAB — LIPID PANEL
CHOL/HDL RATIO: 3 ratio (ref 0.0–4.4)
Cholesterol, Total: 132 mg/dL (ref 100–199)
HDL: 44 mg/dL (ref >39)
LDL Calculated: 54 mg/dL (ref 0–99)
Triglycerides: 172 mg/dL — ABNORMAL HIGH (ref 0–149)
VLDL CHOLESTEROL CAL: 34 mg/dL (ref 5–40)

## 2016-02-13 LAB — CBC WITH DIFFERENTIAL/PLATELET
BASOS: 0 %
Basophils Absolute: 0 10*3/uL (ref 0.0–0.2)
EOS (ABSOLUTE): 0.4 10*3/uL (ref 0.0–0.4)
EOS: 5 %
HEMATOCRIT: 39.2 % (ref 34.0–46.6)
HEMOGLOBIN: 13 g/dL (ref 11.1–15.9)
Immature Grans (Abs): 0 10*3/uL (ref 0.0–0.1)
Immature Granulocytes: 0 %
LYMPHS ABS: 2.4 10*3/uL (ref 0.7–3.1)
Lymphs: 31 %
MCH: 29.7 pg (ref 26.6–33.0)
MCHC: 33.2 g/dL (ref 31.5–35.7)
MCV: 90 fL (ref 79–97)
MONOCYTES: 6 %
MONOS ABS: 0.5 10*3/uL (ref 0.1–0.9)
NEUTROS ABS: 4.5 10*3/uL (ref 1.4–7.0)
Neutrophils: 58 %
Platelets: 358 10*3/uL (ref 150–379)
RBC: 4.38 x10E6/uL (ref 3.77–5.28)
RDW: 13.2 % (ref 12.3–15.4)
WBC: 7.9 10*3/uL (ref 3.4–10.8)

## 2016-02-13 LAB — HEMOGLOBIN A1C
ESTIMATED AVERAGE GLUCOSE: 128 mg/dL
HEMOGLOBIN A1C: 6.1 % — AB (ref 4.8–5.6)

## 2016-03-05 ENCOUNTER — Other Ambulatory Visit: Payer: Self-pay | Admitting: Physician Assistant

## 2016-04-14 ENCOUNTER — Ambulatory Visit (INDEPENDENT_AMBULATORY_CARE_PROVIDER_SITE_OTHER): Payer: Commercial Managed Care - HMO | Admitting: Physician Assistant

## 2016-04-14 ENCOUNTER — Encounter: Payer: Self-pay | Admitting: Physician Assistant

## 2016-04-14 VITALS — BP 140/80 | HR 94 | Temp 98.2°F | Resp 16 | Wt 183.0 lb

## 2016-04-14 DIAGNOSIS — M7581 Other shoulder lesions, right shoulder: Secondary | ICD-10-CM | POA: Insufficient documentation

## 2016-04-14 DIAGNOSIS — M7582 Other shoulder lesions, left shoulder: Secondary | ICD-10-CM | POA: Diagnosis not present

## 2016-04-14 DIAGNOSIS — M7542 Impingement syndrome of left shoulder: Secondary | ICD-10-CM | POA: Diagnosis not present

## 2016-04-14 DIAGNOSIS — R21 Rash and other nonspecific skin eruption: Secondary | ICD-10-CM

## 2016-04-14 MED ORDER — MELOXICAM 15 MG PO TABS: 15.0000 mg | ORAL_TABLET | Freq: Every day | ORAL | 0 refills | Status: DC

## 2016-04-14 NOTE — Patient Instructions (Signed)

## 2016-04-14 NOTE — Progress Notes (Signed)
Patient: Lisa Rivers Female    DOKerby NoraB: [birth year]   45 y.o.   MRN: [id number] Visit Date: 04/14/2016  Today's Provider: Margaretann LovelessJennifer M Kobee Medlen, PA-C   Chief Complaint  Patient presents with  . Shoulder Pain  . Right and Left foot pain   Subjective:    HPI Patient is here with c/o Left shoulder pain. Aches really bad and with numbness. Pain doesn't radiate. She reports it feels like she has "bees" around the left shoulder cap. She has had left shoulder pain for many years but states just recently having the numbness, tingling, "pins and needles" sensations.   Rash is located on the right anterior lower leg. She reports that the poison ivy is not getting any better. She has used 2 rounds of oral prednisone, hydrocortisone cream and Vick's vapor rub. The hydrocortisone and vick's help the itch. The discoloration has now spread to the anteromedial lower leg as well. Poison Ivy dermatitis was initially diagnosed in July. She had been weed eating and it became embedded in the lower extremity.      No Known Allergies   Current Outpatient Prescriptions:  .  albuterol (PROAIR HFA) 108 (90 BASE) MCG/ACT inhaler, Inhale into the lungs as needed., Disp: , Rfl:  .  b complex vitamins capsule, Take 1 capsule by mouth daily., Disp: , Rfl:  .  fluticasone (FLONASE) 50 MCG/ACT nasal spray, Place 2 sprays into both nostrils daily., Disp: 16 g, Rfl: 6 .  metFORMIN (GLUCOPHAGE) 1000 MG tablet, take 1 tablet by mouth twice a day, Disp: 60 tablet, Rfl: 6 .  PEDIATRIC MULTIPLE VIT-C-FA PO, Take 1 tablet by mouth daily., Disp: , Rfl:  .  predniSONE (STERAPRED UNI-PAK 21 TAB) 10 MG (21) TBPK tablet, Take as directed on package instructions, Disp: 21 tablet, Rfl: 0 .  simvastatin (ZOCOR) 20 MG tablet, take 1 tablet by mouth at bedtime, Disp: 30 tablet, Rfl: 6  Review of Systems  Constitutional: Negative.   Respiratory: Negative.   Cardiovascular: Negative for chest pain, palpitations and leg  swelling.  Gastrointestinal: Negative.   Endocrine: Negative.   Musculoskeletal: Positive for arthralgias.  Skin: Positive for rash.  Neurological: Negative.     Social History  Substance Use Topics  . Smoking status: Never Smoker  . Smokeless tobacco: Never Used  . Alcohol use No   Objective:   BP 140/80 (BP Location: Right Arm, Patient Position: Sitting, Cuff Size: Normal)   Pulse 94   Temp 98.2 F (36.8 C) (Oral)   Resp 16   Wt 183 lb (83 kg)   BMI 33.47 kg/m   Physical Exam  Constitutional: She appears well-developed and well-nourished. No distress.  Neck: Normal range of motion. Neck supple. No JVD present. No tracheal deviation present. No thyromegaly present.  Cardiovascular: Normal rate, regular rhythm and normal heart sounds.  Exam reveals no gallop and no friction rub.   No murmur heard. Pulmonary/Chest: Effort normal and breath sounds normal. No respiratory distress. She has no wheezes. She has no rales.  Musculoskeletal:       Right shoulder: Normal.       Left shoulder: She exhibits decreased range of motion (limited abd over 90 degrees, discomfort with horiz add) and tenderness (over supraspinatus and infraspinatus musculature). She exhibits no bony tenderness, no swelling, no effusion, no crepitus, no deformity, no laceration, no pain, no spasm, normal pulse and normal strength.  Lymphadenopathy:    She has no cervical adenopathy.  Skin:  Rash noted. Rash is macular. She is not diaphoretic.     Vitals reviewed.       Assessment & Plan:     1. Rotator cuff tendinitis, left Exercises and stretches given to patient for rotator cuff and impingement. Will try meloxicam for inflammation. PT ordered. I will see her back in 2-4 weeks to check progress. If no improvement with meloxicam may consider steroid injection coupled with PT. If still no improvement will get xray and refer to orthopedics. - meloxicam (MOBIC) 15 MG tablet; Take 1 tablet (15 mg total) by mouth  daily.  Dispense: 30 tablet; Refill: 0 - Ambulatory referral to Physical Therapy  2. Impingement syndrome of left shoulder See above medical treatment plan. - meloxicam (MOBIC) 15 MG tablet; Take 1 tablet (15 mg total) by mouth daily.  Dispense: 30 tablet; Refill: 0 - Ambulatory referral to Physical Therapy  3. Rash and nonspecific skin eruption She has had 2 rounds of steroid and applying topical steroid without improvement and actual spread of pink macules to medial side of right lower extremity. Will refer to dermatology for further evaluation. - Ambulatory referral to Dermatology       Margaretann Loveless, PA-C  [facility]

## 2016-04-27 ENCOUNTER — Encounter: Payer: Self-pay | Admitting: Physician Assistant

## 2016-04-28 ENCOUNTER — Ambulatory Visit (INDEPENDENT_AMBULATORY_CARE_PROVIDER_SITE_OTHER): Payer: Commercial Managed Care - HMO | Admitting: Physician Assistant

## 2016-04-28 ENCOUNTER — Ambulatory Visit
Admission: RE | Admit: 2016-04-28 | Discharge: 2016-04-28 | Disposition: A | Discharge status: Home / Self Care | Payer: Commercial Managed Care - HMO | Source: Ambulatory Visit | Attending: Physician Assistant | Admitting: Physician Assistant

## 2016-04-28 ENCOUNTER — Encounter: Payer: Self-pay | Admitting: Physician Assistant

## 2016-04-28 ENCOUNTER — Telehealth: Payer: Self-pay

## 2016-04-28 VITALS — BP 130/78 | HR 89 | Temp 98.5°F | Resp 16 | Wt 183.8 lb

## 2016-04-28 DIAGNOSIS — M7542 Impingement syndrome of left shoulder: Secondary | ICD-10-CM

## 2016-04-28 DIAGNOSIS — M7582 Other shoulder lesions, left shoulder: Secondary | ICD-10-CM | POA: Insufficient documentation

## 2016-04-28 DIAGNOSIS — M47892 Other spondylosis, cervical region: Secondary | ICD-10-CM | POA: Diagnosis not present

## 2016-04-28 DIAGNOSIS — M47812 Spondylosis without myelopathy or radiculopathy, cervical region: Secondary | ICD-10-CM

## 2016-04-28 MED ORDER — ETODOLAC ER 500 MG PO TB24: 500.0000 mg | ORAL_TABLET | Freq: Every day | ORAL | 0 refills | Status: DC

## 2016-04-28 NOTE — Progress Notes (Signed)
Patient: Lisa Rivers Female    DOB: December 23, 1970   45 y.o.   MRN: [id number] Visit Date: 04/28/2016  Today's Provider: Margaretann Loveless, PA-C   No chief complaint on file.  Subjective:    HPI Patient is here for 2 week follow-up on left rotator cuff tendintis. Meloxicam was started to help with inflammation and exercises of stretches given. Patient was also referred to PT.She has had one session of PT. Per patient she feels the shoulder is worsening after PT. Meloxicam is not helping.  Rash: patient reports rash is going away. She is scheduled to see the Dermatologist Nov. 1.    No Known Allergies   Current Outpatient Prescriptions:  .  albuterol (PROAIR HFA) 108 (90 BASE) MCG/ACT inhaler, Inhale into the lungs as needed., Disp: , Rfl:  .  b complex vitamins capsule, Take 1 capsule by mouth daily., Disp: , Rfl:  .  fluticasone (FLONASE) 50 MCG/ACT nasal spray, Place 2 sprays into both nostrils daily., Disp: 16 g, Rfl: 6 .  meloxicam (MOBIC) 15 MG tablet, Take 1 tablet (15 mg total) by mouth daily., Disp: 30 tablet, Rfl: 0 .  metFORMIN (GLUCOPHAGE) 1000 MG tablet, take 1 tablet by mouth twice a day, Disp: 60 tablet, Rfl: 6 .  PEDIATRIC MULTIPLE VIT-C-FA PO, Take 1 tablet by mouth daily., Disp: , Rfl:  .  simvastatin (ZOCOR) 20 MG tablet, take 1 tablet by mouth at bedtime, Disp: 30 tablet, Rfl: 6  Review of Systems  Respiratory: Negative.   Cardiovascular: Negative.   Gastrointestinal: Negative.   Musculoskeletal: Positive for arthralgias. Negative for back pain, neck pain and neck stiffness.  Neurological: Positive for weakness (left arm).    Social History  Substance Use Topics  . Smoking status: Never Smoker  . Smokeless tobacco: Never Used  . Alcohol use No   Objective:   BP 130/78 (BP Location: Right Arm, Patient Position: Sitting, Cuff Size: Normal)   Pulse 89   Temp 98.5 F (36.9 C) (Oral)   Resp 16   Wt 183 lb 12.8 oz (83.4 kg)   BMI 33.62 kg/m    Physical Exam  Constitutional: She appears well-developed and well-nourished. No distress.  Neck: Normal range of motion. Neck supple. Muscular tenderness (left upper trap) present. No spinous process tenderness present. Normal range of motion present.  Cardiovascular: Normal rate, regular rhythm and normal heart sounds.  Exam reveals no gallop and no friction rub.   No murmur heard. Pulmonary/Chest: Effort normal and breath sounds normal. No respiratory distress. She has no wheezes. She has no rales.  Musculoskeletal:       Right shoulder: Normal.       Left shoulder: She exhibits decreased range of motion, tenderness, pain and decreased strength. She exhibits no bony tenderness, no swelling, no crepitus, no deformity, no spasm and normal pulse.  Skin: She is not diaphoretic.  Vitals reviewed.     Assessment & Plan:     1. Rotator cuff tendinitis, left Will get xray to r/o any bony abnormality. Steroid injection given in left shoulder today (see procedure note below). No heavy lifting x 1 week. Continue PT. Etodolac given to replace meloxicam as below. I will see her back in 3 weeks unless needed sooner.  - DG Shoulder Left; Future - etodolac (LODINE XL) 500 MG 24 hr tablet; Take 1 tablet (500 mg total) by mouth daily.  Dispense: 30 tablet; Refill: 0 - Ambulatory referral to Orthopedic Surgery -  methylPREDNISolone acetate (DEPO-MEDROL) injection 40 mg; Inject 1 mL (40 mg total) into the muscle once.  2. Impingement syndrome of shoulder region, left See above medical treatment plan. - DG Shoulder Left; Future - etodolac (LODINE XL) 500 MG 24 hr tablet; Take 1 tablet (500 mg total) by mouth daily.  Dispense: 30 tablet; Refill: 0 - Ambulatory referral to Orthopedic Surgery - methylPREDNISolone acetate (DEPO-MEDROL) injection 40 mg; Inject 1 mL (40 mg total) into the muscle once.  3. Facet hypertrophy of cervical region Xray of left shoulder revealed facet hypertrophy and possible DDD  from C5-C7 that is most likely causing the numbness/tingling through left shoulder and left arm. Will refer to Dr. Yves Dillhasnis for further evaluation. She may continue PT. - Ambulatory referral to Orthopedic Surgery  Procedure Note: Benefits, risks (including infection, tattooing, adipose dimpling, and tendon rupture) and alternatives were explained to the patient. All questions were sought and answered.  Patient agreed to continue and verbal consent was obtained.   A steroid injection was performed on the left posterior shoulder using 4cc of 1% plain Xyloocaine and 40 mg of depo-medrol. This was well tolerated and no complications. A dry bandage was applied. Patient was given instructions on after care and for when to call in case of infection.   Addend: CLINICAL DATA:  Posterior left shoulder pain for 5 years without history of injury. Numbness of the left shoulder 6 months ago.  EXAM: LEFT SHOULDER - 2+ VIEW  COMPARISON:  None.  FINDINGS: There is no evidence of fracture or dislocation. There is no evidence of arthropathy or other focal bone abnormality. Soft tissues are unremarkable. Suggestion of mild facet hypertrophy involving C5 through C7 on the left of the partially included cervical spine possibly contributing to patient's left upper extremity symptoms.  IMPRESSION: No acute osseous abnormality of the left shoulder. Suggestion of lower cervical facet arthropathy on the left of the visualized cervical spine.   Electronically Signed   By: Tollie Ethavid  Kwon M.D.   On: 04/28/2016 14:53      Margaretann LovelessJennifer M Albirta Rhinehart, PA-C  [facility]

## 2016-04-28 NOTE — Telephone Encounter (Signed)
-----   Message from Margaretann LovelessJennifer M Burnette, PA-C sent at 04/28/2016  3:17 PM EDT ----- Left shoulder ok. Noted swelling on the left at the cervical spine from C5-C7. This does mean we may not see much benefit from the steroid injection today. May benefit by seeing someone that specializes in cervical spine issues. If desired will make referral.

## 2016-04-28 NOTE — Telephone Encounter (Signed)
Will refer to Dr. Yves Dillhasnis.

## 2016-04-28 NOTE — Patient Instructions (Signed)
Shoulder Injection, Care After Refer to this sheet in the next few weeks. These instructions provide you with information about caring for yourself after your procedure. Your health care provider may also give you more specific instructions. Your treatment has been planned according to current medical practices, but problems sometimes occur. Call your health care provider if you have any problems or questions after your procedure. WHAT TO EXPECT AFTER THE PROCEDURE After your procedure, it is common to have:  Soreness.  Warmth.  Swelling. You may have more pain, swelling, and warmth than you did before the injection. This reaction may last for about one day.  HOME CARE INSTRUCTIONS Bathing  If you were given a bandage (dressing), keep it dry until your health care provider says it can be removed. Ask your health care provider when you can start showering or taking a bath. Managing Pain, Stiffness, and Swelling  If directed, apply ice to the injection area:  Put ice in a plastic bag.  Place a towel between your skin and the bag.  Leave the ice on for 20 minutes, 2-3 times per day.  Do not apply heat to your shoulder.  Raise the injection area above the level of your heart while you are sitting or lying down. Activity  Avoid strenuous activities for as long as directed by your health care provider. Ask your health care provider when you can return to your normal activities. General Instructions  Take medicines only as directed by your health care provider.  Do not take aspirin or other over-the-counter medicines unless your health care provider says you can.  Check your injection site every day for signs of infection. Watch for:  Redness, swelling, or pain.  Fluid, blood, or pus.  Follow your health care provider's instructions about dressing changes and removal. SEEK MEDICAL CARE IF:  You have symptoms at your injection site that last longer than two days after your  procedure.  You have redness, swelling, or pain in your injection area.  You have fluid, blood, or pus coming from your injection site.  You have warmth in your injection area.  You have a fever.  Your pain is not controlled with medicine. SEEK IMMEDIATE MEDICAL CARE IF:  Your shoulder turns very red.  Your shoulder becomes very swollen.  Your shoulder pain is severe.   This information is not intended to replace advice given to you by your health care provider. Make sure you discuss any questions you have with your health care provider.   Document Released: 07/12/2014 Document Reviewed: 07/12/2014 Elsevier Interactive Patient Education 2016 [employer] Inc[employer]  

## 2016-04-28 NOTE — Telephone Encounter (Signed)
Patient was advised please proceed with referral to specialist. Renette ButtersKW

## 2016-04-28 NOTE — Telephone Encounter (Signed)
[email or web address] 

## 2016-04-29 MED ORDER — METHYLPREDNISOLONE ACETATE 40 MG/ML IJ SUSP
40.0000 mg | Freq: Once | INTRAMUSCULAR | Status: AC
  Administered 2016-04-28: 40 mg via INTRAMUSCULAR

## 2016-04-30 ENCOUNTER — Telehealth: Payer: Self-pay | Admitting: Physician Assistant

## 2016-04-30 NOTE — Telephone Encounter (Signed)
Ok to refer to another ortho spine specialist

## 2016-04-30 NOTE — Telephone Encounter (Signed)
Pt was referred to Dr Yves Dillhasnis for shoulder & neck pain.She can"t get in with him until Dec 5th.She would like to be referred to different doctor

## 2016-05-03 NOTE — Telephone Encounter (Signed)
Appointment made with Lisa HousemanAlexander Rivers for 05/05/16 at 2:45 (Phone [phone])

## 2016-05-07 ENCOUNTER — Other Ambulatory Visit: Payer: Self-pay | Admitting: Family Medicine

## 2016-05-13 ENCOUNTER — Other Ambulatory Visit: Payer: Self-pay | Admitting: Physician Assistant

## 2016-05-13 DIAGNOSIS — M75102 Unspecified rotator cuff tear or rupture of left shoulder, not specified as traumatic: Secondary | ICD-10-CM

## 2016-05-17 ENCOUNTER — Ambulatory Visit
Admission: RE | Admit: 2016-05-17 | Discharge: 2016-05-17 | Disposition: A | Discharge status: Home / Self Care | Payer: Commercial Managed Care - HMO | Source: Ambulatory Visit | Attending: Physician Assistant | Admitting: Physician Assistant

## 2016-05-17 DIAGNOSIS — M47892 Other spondylosis, cervical region: Secondary | ICD-10-CM | POA: Diagnosis not present

## 2016-05-17 DIAGNOSIS — M75102 Unspecified rotator cuff tear or rupture of left shoulder, not specified as traumatic: Secondary | ICD-10-CM | POA: Diagnosis present

## 2016-05-17 DIAGNOSIS — M50322 Other cervical disc degeneration at C5-C6 level: Secondary | ICD-10-CM | POA: Diagnosis not present

## 2016-05-19 ENCOUNTER — Ambulatory Visit: Payer: Commercial Managed Care - HMO | Admitting: Physician Assistant

## 2016-06-10 ENCOUNTER — Other Ambulatory Visit: Payer: Self-pay | Admitting: Orthopedic Surgery

## 2016-06-10 DIAGNOSIS — M25512 Pain in left shoulder: Secondary | ICD-10-CM

## 2016-06-19 ENCOUNTER — Ambulatory Visit
Admission: RE | Admit: 2016-06-19 | Discharge: 2016-06-19 | Disposition: A | Discharge status: Home / Self Care | Payer: 59 | Source: Ambulatory Visit | Attending: Orthopedic Surgery | Admitting: Orthopedic Surgery

## 2016-06-19 DIAGNOSIS — M25512 Pain in left shoulder: Secondary | ICD-10-CM

## 2016-07-14 ENCOUNTER — Other Ambulatory Visit: Payer: Self-pay | Admitting: Neurology

## 2016-09-07 ENCOUNTER — Other Ambulatory Visit: Payer: Self-pay

## 2016-09-07 DIAGNOSIS — J309 Allergic rhinitis, unspecified: Secondary | ICD-10-CM

## 2016-09-07 MED ORDER — FLUTICASONE PROPIONATE 50 MCG/ACT NA SUSP: 2.0000 | Freq: Every day | NASAL | 6 refills | Status: DC

## 2016-09-08 ENCOUNTER — Telehealth: Payer: Self-pay

## 2016-09-08 NOTE — Telephone Encounter (Signed)
Called in Prescription for Fluticasone 50MCG/ACT nasal spray. QTY:16 R: 5  Spoke with the pharmacist.

## 2016-09-16 ENCOUNTER — Other Ambulatory Visit: Payer: Self-pay | Admitting: Orthopedic Surgery

## 2016-09-16 DIAGNOSIS — S43432A Superior glenoid labrum lesion of left shoulder, initial encounter: Secondary | ICD-10-CM

## 2016-09-29 ENCOUNTER — Ambulatory Visit
Admission: RE | Admit: 2016-09-29 | Discharge: 2016-09-29 | Disposition: A | Discharge status: Home / Self Care | Payer: 59 | Source: Ambulatory Visit | Attending: Orthopedic Surgery | Admitting: Orthopedic Surgery

## 2016-09-29 ENCOUNTER — Other Ambulatory Visit: Payer: Self-pay | Admitting: Orthopedic Surgery

## 2016-09-29 DIAGNOSIS — X58XXXA Exposure to other specified factors, initial encounter: Secondary | ICD-10-CM | POA: Diagnosis not present

## 2016-09-29 DIAGNOSIS — S43432A Superior glenoid labrum lesion of left shoulder, initial encounter: Secondary | ICD-10-CM | POA: Diagnosis not present

## 2016-09-29 MED ORDER — IOPAMIDOL (ISOVUE-200) INJECTION 41%
10.0000 mL | Freq: Once | INTRAVENOUS | Status: AC
  Administered 2016-09-29: 10 mL via INTRAVENOUS
  Filled 2016-09-29: qty 10

## 2016-09-29 MED ORDER — GADOBENATE DIMEGLUMINE 529 MG/ML IV SOLN
0.1000 mL | Freq: Once | INTRAVENOUS | Status: AC | PRN
  Administered 2016-09-29: 0.1 mL via INTRA_ARTICULAR

## 2016-09-29 MED ORDER — LIDOCAINE HCL (PF) 1 % IJ SOLN
3.0000 mL | Freq: Once | INTRAMUSCULAR | Status: AC
  Administered 2016-09-29: 3 mL
  Filled 2016-09-29: qty 4

## 2016-09-29 MED ORDER — SODIUM CHLORIDE 0.9 % IJ SOLN
10.0000 mL | INTRAMUSCULAR | Status: DC | PRN
  Administered 2016-09-29: 10 mL
  Filled 2016-09-29: qty 10

## 2016-10-06 ENCOUNTER — Other Ambulatory Visit: Payer: Self-pay | Admitting: Physician Assistant

## 2016-10-11 ENCOUNTER — Other Ambulatory Visit: Payer: Self-pay | Admitting: Orthopedic Surgery

## 2016-10-26 ENCOUNTER — Encounter
Admission: RE | Admit: 2016-10-26 | Discharge: 2016-10-26 | Disposition: A | Discharge status: Home / Self Care | Payer: Commercial Managed Care - HMO | Source: Ambulatory Visit | Attending: Orthopedic Surgery | Admitting: Orthopedic Surgery

## 2016-10-26 ENCOUNTER — Encounter: Payer: Self-pay | Admitting: *Deleted

## 2016-10-26 DIAGNOSIS — Z01812 Encounter for preprocedural laboratory examination: Secondary | ICD-10-CM | POA: Insufficient documentation

## 2016-10-26 DIAGNOSIS — E119 Type 2 diabetes mellitus without complications: Secondary | ICD-10-CM | POA: Insufficient documentation

## 2016-10-26 DIAGNOSIS — Z01810 Encounter for preprocedural cardiovascular examination: Secondary | ICD-10-CM | POA: Insufficient documentation

## 2016-10-26 DIAGNOSIS — I498 Other specified cardiac arrhythmias: Secondary | ICD-10-CM | POA: Insufficient documentation

## 2016-10-26 HISTORY — DX: Encounter for adoption services: Z02.82

## 2016-10-26 HISTORY — DX: Other specified health status: Z78.9

## 2016-10-26 HISTORY — DX: Sprain of anterior cruciate ligament of unspecified knee, initial encounter: S83.519A

## 2016-10-26 NOTE — Patient Instructions (Signed)
  Your procedure is scheduled on: 11-02-16 (Tuesday) Report to Same Day Surgery 2nd floor medical mall [facility] Entrance-take elevator on left to 2nd floor.  Check in with surgery information desk.) To find out your arrival time please call [phone] between 1PM - 3PM on 11-01-16 (Monday)  Remember: Instructions that are not followed completely may result in serious medical risk, up to and including death, or upon the discretion of your surgeon and anesthesiologist your surgery may need to be rescheduled.    _x___ 1. Do not eat food or drink liquids after midnight. No gum chewing or hard candies.     __x__ 2. No Alcohol for 24 hours before or after surgery.   __x__3. No Smoking for 24 prior to surgery.   ____  4. Bring all medications with you on the day of surgery if instructed.    __x__ 5. Notify your doctor if there is any change in your medical condition     (cold, fever, infections).     Do not wear jewelry, make-up, hairpins, clips or nail polish.  Do not wear lotions, powders, or perfumes. You may wear deodorant.  Do not shave 48 hours prior to surgery. Men may shave face and neck.  Do not bring valuables to the hospital.    [facility] is not responsible for any belongings or valuables.               Contacts, dentures or bridgework may not be worn into surgery.  Leave your suitcase in the car. After surgery it may be brought to your room.  For patients admitted to the hospital, discharge time is determined by your treatment team.   Patients discharged the day of surgery will not be allowed to drive home.  You will need someone to drive you home and stay with you the night of your procedure.    Please read over the following fact sheets that you were given:     ____ Take anti-hypertensive (unless it includes a diuretic), cardiac, seizure, asthma, anti-reflux and psychiatric medicines. These include:  1. NONE  2.  3.  4.  5.  6.  ____Fleets enema or Magnesium  Citrate as directed.   _x___ Use CHG Soap or sage wipes as directed on instruction sheet   _X___ Use inhalers on the day of surgery and bring to hospital day of surgery-USE ALBUTEROL INHALER DAY OF SURGERY AND BRING TO HOSPITAL  _X___ Stop Metformin and Janumet 2 days prior to surgery-LAST DOSE OF METFORMIN ON Saturday, April 28TH    ____ Take 1/2 of usual insulin dose the night before surgery and none on the morning surgery.   ____ Follow recommendations from Cardiologist, Pulmonologist or PCP regarding stopping Aspirin, Coumadin, Pllavix ,Eliquis, Effient, or Pradaxa, and Pletal.  X____Stop Anti-inflammatories such as Advil, Aleve, Ibuprofen, Motrin, Naproxen, Naprosyn, Goodies powders or aspirin products NOW-OK to take Tylenol    ____ Stop supplements until after surgery.   ____ Bring C-Pap to the hospital.

## 2016-10-27 ENCOUNTER — Encounter
Admission: RE | Admit: 2016-10-27 | Discharge: 2016-10-27 | Disposition: A | Discharge status: Home / Self Care | Payer: Commercial Managed Care - HMO | Source: Ambulatory Visit | Attending: Orthopedic Surgery | Admitting: Orthopedic Surgery

## 2016-10-27 DIAGNOSIS — Z01812 Encounter for preprocedural laboratory examination: Secondary | ICD-10-CM | POA: Diagnosis not present

## 2016-10-27 DIAGNOSIS — Z01810 Encounter for preprocedural cardiovascular examination: Secondary | ICD-10-CM | POA: Diagnosis present

## 2016-10-27 DIAGNOSIS — E119 Type 2 diabetes mellitus without complications: Secondary | ICD-10-CM | POA: Diagnosis not present

## 2016-10-27 DIAGNOSIS — I498 Other specified cardiac arrhythmias: Secondary | ICD-10-CM | POA: Diagnosis not present

## 2016-10-27 LAB — BASIC METABOLIC PANEL
ANION GAP: 7 (ref 5–15)
BUN: 11 mg/dL (ref 6–20)
CALCIUM: 9.1 mg/dL (ref 8.9–10.3)
CO2: 27 mmol/L (ref 22–32)
Chloride: 103 mmol/L (ref 101–111)
Creatinine, Ser: 0.84 mg/dL (ref 0.44–1.00)
GFR calc Af Amer: >60 mL/min (ref >60)
GFR calc non Af Amer: >60 mL/min (ref >60)
GLUCOSE: 147 mg/dL — AB (ref 65–99)
Potassium: 3.7 mmol/L (ref 3.5–5.1)
Sodium: 137 mmol/L (ref 135–145)

## 2016-10-27 LAB — CBC WITH DIFFERENTIAL/PLATELET
BASOS PCT: 1 %
Basophils Absolute: 0.1 10*3/uL (ref 0–0.1)
Eosinophils Absolute: 0.4 10*3/uL (ref 0–0.7)
Eosinophils Relative: 5 %
HEMATOCRIT: 38.9 % (ref 35.0–47.0)
Hemoglobin: 13.3 g/dL (ref 12.0–16.0)
Lymphocytes Relative: 29 %
Lymphs Abs: 2.6 10*3/uL (ref 1.0–3.6)
MCH: 29.8 pg (ref 26.0–34.0)
MCHC: 34.3 g/dL (ref 32.0–36.0)
MCV: 87 fL (ref 80.0–100.0)
MONO ABS: 0.7 10*3/uL (ref 0.2–0.9)
MONOS PCT: 7 %
NEUTROS ABS: 5.3 10*3/uL (ref 1.4–6.5)
Neutrophils Relative %: 58 %
Platelets: 379 10*3/uL (ref 150–440)
RBC: 4.47 MIL/uL (ref 3.80–5.20)
RDW: 12.7 % (ref 11.5–14.5)
WBC: 9.1 10*3/uL (ref 3.6–11.0)

## 2016-10-27 LAB — PROTIME-INR
INR: 0.92
Prothrombin Time: 12.4 seconds (ref 11.4–15.2)

## 2016-10-27 LAB — APTT: aPTT: 30 seconds (ref 24–36)

## 2016-11-01 MED ORDER — CEFAZOLIN SODIUM-DEXTROSE 2-4 GM/100ML-% IV SOLN
2.0000 g | INTRAVENOUS | Status: AC
  Administered 2016-11-02: 2 g via INTRAVENOUS

## 2016-11-02 ENCOUNTER — Ambulatory Visit
Admission: RE | Admit: 2016-11-02 | Discharge: 2016-11-02 | Disposition: A | Discharge status: Home / Self Care | Payer: 59 | Source: Ambulatory Visit | Attending: Orthopedic Surgery | Admitting: Orthopedic Surgery

## 2016-11-02 ENCOUNTER — Encounter
Admission: RE | Disposition: A | Discharge status: Home / Self Care | Payer: Self-pay | Source: Ambulatory Visit | Attending: Orthopedic Surgery

## 2016-11-02 ENCOUNTER — Encounter: Payer: Self-pay | Admitting: Anesthesiology

## 2016-11-02 ENCOUNTER — Ambulatory Visit: Payer: 59 | Admitting: Anesthesiology

## 2016-11-02 DIAGNOSIS — Z7984 Long term (current) use of oral hypoglycemic drugs: Secondary | ICD-10-CM | POA: Diagnosis not present

## 2016-11-02 DIAGNOSIS — Z79899 Other long term (current) drug therapy: Secondary | ICD-10-CM | POA: Insufficient documentation

## 2016-11-02 DIAGNOSIS — E119 Type 2 diabetes mellitus without complications: Secondary | ICD-10-CM | POA: Insufficient documentation

## 2016-11-02 DIAGNOSIS — S43432A Superior glenoid labrum lesion of left shoulder, initial encounter: Secondary | ICD-10-CM | POA: Diagnosis present

## 2016-11-02 DIAGNOSIS — Z7951 Long term (current) use of inhaled steroids: Secondary | ICD-10-CM | POA: Insufficient documentation

## 2016-11-02 DIAGNOSIS — S46112A Strain of muscle, fascia and tendon of long head of biceps, left arm, initial encounter: Secondary | ICD-10-CM | POA: Diagnosis not present

## 2016-11-02 DIAGNOSIS — Z9889 Other specified postprocedural states: Secondary | ICD-10-CM | POA: Diagnosis not present

## 2016-11-02 DIAGNOSIS — M25712 Osteophyte, left shoulder: Secondary | ICD-10-CM | POA: Diagnosis not present

## 2016-11-02 DIAGNOSIS — X58XXXA Exposure to other specified factors, initial encounter: Secondary | ICD-10-CM | POA: Diagnosis not present

## 2016-11-02 DIAGNOSIS — M7552 Bursitis of left shoulder: Secondary | ICD-10-CM | POA: Diagnosis not present

## 2016-11-02 DIAGNOSIS — J45909 Unspecified asthma, uncomplicated: Secondary | ICD-10-CM | POA: Diagnosis not present

## 2016-11-02 DIAGNOSIS — M19012 Primary osteoarthritis, left shoulder: Secondary | ICD-10-CM | POA: Insufficient documentation

## 2016-11-02 DIAGNOSIS — Z9049 Acquired absence of other specified parts of digestive tract: Secondary | ICD-10-CM | POA: Insufficient documentation

## 2016-11-02 DIAGNOSIS — M7542 Impingement syndrome of left shoulder: Secondary | ICD-10-CM | POA: Diagnosis not present

## 2016-11-02 DIAGNOSIS — Z8249 Family history of ischemic heart disease and other diseases of the circulatory system: Secondary | ICD-10-CM | POA: Diagnosis not present

## 2016-11-02 DIAGNOSIS — E785 Hyperlipidemia, unspecified: Secondary | ICD-10-CM | POA: Diagnosis not present

## 2016-11-02 DIAGNOSIS — Z811 Family history of alcohol abuse and dependence: Secondary | ICD-10-CM | POA: Insufficient documentation

## 2016-11-02 DIAGNOSIS — Z833 Family history of diabetes mellitus: Secondary | ICD-10-CM | POA: Insufficient documentation

## 2016-11-02 HISTORY — PX: SHOULDER ARTHROSCOPY WITH BANKART REPAIR: SHX5673

## 2016-11-02 HISTORY — DX: Unspecified osteoarthritis, unspecified site: M19.90

## 2016-11-02 HISTORY — DX: Headache, unspecified: R51.9

## 2016-11-02 HISTORY — DX: Headache: R51

## 2016-11-02 HISTORY — DX: Unspecified asthma, uncomplicated: J45.909

## 2016-11-02 LAB — GLUCOSE, CAPILLARY
GLUCOSE-CAPILLARY: 277 mg/dL — AB (ref 65–99)
Glucose-Capillary: 181 mg/dL — ABNORMAL HIGH (ref 65–99)
Glucose-Capillary: 256 mg/dL — ABNORMAL HIGH (ref 65–99)

## 2016-11-02 LAB — POCT PREGNANCY, URINE: PREG TEST UR: NEGATIVE

## 2016-11-02 SURGERY — SHOULDER ARTHROSCOPY WITH BANKART REPAIR
Anesthesia: Regional | Site: Shoulder | Laterality: Left | Wound class: Clean

## 2016-11-02 MED ORDER — ONDANSETRON HCL 4 MG PO TABS
4.0000 mg | ORAL_TABLET | Freq: Three times a day (TID) | ORAL | Status: DC | PRN
  Administered 2016-11-02: 4 mg via ORAL

## 2016-11-02 MED ORDER — NEOMYCIN-POLYMYXIN B GU 40-200000 IR SOLN
Status: AC
  Filled 2016-11-02: qty 2

## 2016-11-02 MED ORDER — INSULIN ASPART 100 UNIT/ML ~~LOC~~ SOLN
SUBCUTANEOUS | Status: AC
  Filled 2016-11-02: qty 3

## 2016-11-02 MED ORDER — ONDANSETRON HCL 4 MG/2ML IJ SOLN
INTRAMUSCULAR | Status: AC
  Filled 2016-11-02: qty 2

## 2016-11-02 MED ORDER — EPINEPHRINE PF 1 MG/ML IJ SOLN
INTRAMUSCULAR | Status: DC | PRN
  Administered 2016-11-02: 18 mL
  Administered 2016-11-02: 1 mL
  Administered 2016-11-02: 2 mL

## 2016-11-02 MED ORDER — FENTANYL CITRATE (PF) 100 MCG/2ML IJ SOLN
INTRAMUSCULAR | Status: DC | PRN
  Administered 2016-11-02 (×2): 50 ug via INTRAVENOUS

## 2016-11-02 MED ORDER — OXYCODONE HCL 5 MG PO TABS
5.0000 mg | ORAL_TABLET | Freq: Once | ORAL | Status: AC | PRN
  Administered 2016-11-02: 5 mg via ORAL

## 2016-11-02 MED ORDER — LIDOCAINE HCL (PF) 1 % IJ SOLN
INTRAMUSCULAR | Status: DC | PRN
  Administered 2016-11-02: 1 mL via INTRADERMAL

## 2016-11-02 MED ORDER — FENTANYL CITRATE (PF) 100 MCG/2ML IJ SOLN: 25.0000 ug | INTRAMUSCULAR | Status: DC | PRN

## 2016-11-02 MED ORDER — MIDAZOLAM HCL 2 MG/2ML IJ SOLN
INTRAMUSCULAR | Status: AC
  Filled 2016-11-02: qty 4

## 2016-11-02 MED ORDER — EPHEDRINE SULFATE 50 MG/ML IJ SOLN
INTRAMUSCULAR | Status: AC
  Filled 2016-11-02: qty 1

## 2016-11-02 MED ORDER — MIDAZOLAM HCL 2 MG/2ML IJ SOLN
INTRAMUSCULAR | Status: AC
  Administered 2016-11-02: 1 mg via INTRAVENOUS
  Filled 2016-11-02: qty 2

## 2016-11-02 MED ORDER — ROCURONIUM BROMIDE 100 MG/10ML IV SOLN
INTRAVENOUS | Status: DC | PRN
  Administered 2016-11-02: 30 mg via INTRAVENOUS
  Administered 2016-11-02: 10 mg via INTRAVENOUS
  Administered 2016-11-02: 20 mg via INTRAVENOUS
  Administered 2016-11-02: 10 mg via INTRAVENOUS

## 2016-11-02 MED ORDER — PHENYLEPHRINE HCL 10 MG/ML IJ SOLN
INTRAMUSCULAR | Status: DC | PRN
  Administered 2016-11-02 (×3): 100 ug via INTRAVENOUS

## 2016-11-02 MED ORDER — CHLORHEXIDINE GLUCONATE CLOTH 2 % EX PADS: 6.0000 | MEDICATED_PAD | Freq: Once | CUTANEOUS | Status: DC

## 2016-11-02 MED ORDER — MIDAZOLAM HCL 2 MG/2ML IJ SOLN
1.0000 mg | Freq: Once | INTRAMUSCULAR | Status: AC
  Administered 2016-11-02: 1 mg via INTRAVENOUS

## 2016-11-02 MED ORDER — ROCURONIUM BROMIDE 50 MG/5ML IV SOLN
INTRAVENOUS | Status: AC
  Filled 2016-11-02: qty 1

## 2016-11-02 MED ORDER — OXYCODONE HCL 5 MG/5ML PO SOLN: 5.0000 mg | Freq: Once | ORAL | Status: AC | PRN

## 2016-11-02 MED ORDER — SUGAMMADEX SODIUM 500 MG/5ML IV SOLN
INTRAVENOUS | Status: DC | PRN
  Administered 2016-11-02: 175 mg via INTRAVENOUS

## 2016-11-02 MED ORDER — BUPIVACAINE HCL (PF) 0.25 % IJ SOLN
INTRAMUSCULAR | Status: AC
  Filled 2016-11-02: qty 30

## 2016-11-02 MED ORDER — LIDOCAINE HCL (CARDIAC) 20 MG/ML IV SOLN
INTRAVENOUS | Status: DC | PRN
  Administered 2016-11-02: 80 mg via INTRAVENOUS

## 2016-11-02 MED ORDER — LIDOCAINE HCL (PF) 1 % IJ SOLN
INTRAMUSCULAR | Status: AC
  Filled 2016-11-02: qty 5

## 2016-11-02 MED ORDER — SODIUM CHLORIDE 0.9 % IV SOLN
INTRAVENOUS | Status: DC | PRN
  Administered 2016-11-02: 20 ug/min via INTRAVENOUS

## 2016-11-02 MED ORDER — ACETAMINOPHEN 10 MG/ML IV SOLN
INTRAVENOUS | Status: DC | PRN
  Administered 2016-11-02: 1000 mg via INTRAVENOUS

## 2016-11-02 MED ORDER — ONDANSETRON HCL 4 MG PO TABS
ORAL_TABLET | ORAL | Status: AC
  Filled 2016-11-02: qty 1

## 2016-11-02 MED ORDER — LIDOCAINE HCL (PF) 2 % IJ SOLN
INTRAMUSCULAR | Status: AC
  Filled 2016-11-02: qty 2

## 2016-11-02 MED ORDER — LIDOCAINE HCL (PF) 1 % IJ SOLN
INTRAMUSCULAR | Status: AC
  Filled 2016-11-02: qty 30

## 2016-11-02 MED ORDER — INSULIN ASPART 100 UNIT/ML ~~LOC~~ SOLN
3.0000 [IU] | Freq: Once | SUBCUTANEOUS | Status: AC
  Administered 2016-11-02: 3 [IU] via SUBCUTANEOUS

## 2016-11-02 MED ORDER — ROPIVACAINE HCL 5 MG/ML IJ SOLN
INTRAMUSCULAR | Status: DC | PRN
  Administered 2016-11-02 (×3): 10 mL via PERINEURAL

## 2016-11-02 MED ORDER — SUCCINYLCHOLINE CHLORIDE 20 MG/ML IJ SOLN
INTRAMUSCULAR | Status: AC
  Filled 2016-11-02: qty 1

## 2016-11-02 MED ORDER — OXYCODONE HCL 5 MG PO TABS: 5.0000 mg | ORAL_TABLET | ORAL | 0 refills | Status: DC | PRN

## 2016-11-02 MED ORDER — SEVOFLURANE IN SOLN
RESPIRATORY_TRACT | Status: AC
  Filled 2016-11-02: qty 250

## 2016-11-02 MED ORDER — PROPOFOL 10 MG/ML IV BOLUS
INTRAVENOUS | Status: AC
  Filled 2016-11-02: qty 20

## 2016-11-02 MED ORDER — BUPIVACAINE HCL (PF) 0.25 % IJ SOLN
INTRAMUSCULAR | Status: DC | PRN
  Administered 2016-11-02: 30 mL

## 2016-11-02 MED ORDER — SUGAMMADEX SODIUM 200 MG/2ML IV SOLN
INTRAVENOUS | Status: AC
  Filled 2016-11-02: qty 2

## 2016-11-02 MED ORDER — OXYCODONE HCL 5 MG PO TABS
ORAL_TABLET | ORAL | Status: AC
  Filled 2016-11-02: qty 1

## 2016-11-02 MED ORDER — DEXAMETHASONE SODIUM PHOSPHATE 10 MG/ML IJ SOLN
INTRAMUSCULAR | Status: AC
  Filled 2016-11-02: qty 1

## 2016-11-02 MED ORDER — EPINEPHRINE 30 MG/30ML IJ SOLN
INTRAMUSCULAR | Status: AC
  Filled 2016-11-02: qty 1

## 2016-11-02 MED ORDER — FENTANYL CITRATE (PF) 100 MCG/2ML IJ SOLN
INTRAMUSCULAR | Status: AC
  Administered 2016-11-02: 50 ug via INTRAVENOUS
  Filled 2016-11-02: qty 2

## 2016-11-02 MED ORDER — KETOROLAC TROMETHAMINE 30 MG/ML IJ SOLN
INTRAMUSCULAR | Status: AC
  Filled 2016-11-02: qty 1

## 2016-11-02 MED ORDER — ONDANSETRON HCL 4 MG PO TABS: 4.0000 mg | ORAL_TABLET | Freq: Three times a day (TID) | ORAL | 0 refills | Status: DC | PRN

## 2016-11-02 MED ORDER — FAMOTIDINE 20 MG PO TABS
ORAL_TABLET | ORAL | Status: AC
  Administered 2016-11-02: 20 mg via ORAL
  Filled 2016-11-02: qty 1

## 2016-11-02 MED ORDER — FENTANYL CITRATE (PF) 100 MCG/2ML IJ SOLN
INTRAMUSCULAR | Status: AC
  Filled 2016-11-02: qty 2

## 2016-11-02 MED ORDER — KETOROLAC TROMETHAMINE 30 MG/ML IJ SOLN
INTRAMUSCULAR | Status: DC | PRN
  Administered 2016-11-02: 30 mg via INTRAVENOUS

## 2016-11-02 MED ORDER — MIDAZOLAM HCL 2 MG/2ML IJ SOLN
INTRAMUSCULAR | Status: AC
  Filled 2016-11-02: qty 2

## 2016-11-02 MED ORDER — PROPOFOL 10 MG/ML IV BOLUS
INTRAVENOUS | Status: DC | PRN
  Administered 2016-11-02: 50 mg via INTRAVENOUS
  Administered 2016-11-02: 150 mg via INTRAVENOUS

## 2016-11-02 MED ORDER — FAMOTIDINE 20 MG PO TABS
20.0000 mg | ORAL_TABLET | Freq: Once | ORAL | Status: AC
  Administered 2016-11-02: 20 mg via ORAL

## 2016-11-02 MED ORDER — SODIUM CHLORIDE 0.9 % IV SOLN
INTRAVENOUS | Status: DC
  Administered 2016-11-02: 07:00:00 via INTRAVENOUS

## 2016-11-02 MED ORDER — ACETAMINOPHEN 10 MG/ML IV SOLN
INTRAVENOUS | Status: AC
  Filled 2016-11-02: qty 100

## 2016-11-02 MED ORDER — CEFAZOLIN SODIUM-DEXTROSE 2-4 GM/100ML-% IV SOLN
INTRAVENOUS | Status: AC
  Filled 2016-11-02: qty 100

## 2016-11-02 MED ORDER — FENTANYL CITRATE (PF) 100 MCG/2ML IJ SOLN
50.0000 ug | Freq: Once | INTRAMUSCULAR | Status: AC
  Administered 2016-11-02: 50 ug via INTRAVENOUS

## 2016-11-02 MED ORDER — DEXAMETHASONE SODIUM PHOSPHATE 10 MG/ML IJ SOLN
INTRAMUSCULAR | Status: DC | PRN
  Administered 2016-11-02: 10 mg via INTRAVENOUS

## 2016-11-02 MED ORDER — PHENYLEPHRINE HCL 10 MG/ML IJ SOLN
INTRAMUSCULAR | Status: AC
  Filled 2016-11-02: qty 1

## 2016-11-02 MED ORDER — ROPIVACAINE HCL 5 MG/ML IJ SOLN
INTRAMUSCULAR | Status: AC
  Filled 2016-11-02: qty 30

## 2016-11-02 MED ORDER — ONDANSETRON HCL 4 MG/2ML IJ SOLN
INTRAMUSCULAR | Status: DC | PRN
  Administered 2016-11-02 (×2): 4 mg via INTRAVENOUS

## 2016-11-02 SURGICAL SUPPLY — 73 items
ADAPTER IRRIG TUBE 2 SPIKE SOL (ADAPTER) ×6 IMPLANT
ANCHOR SUT BIOC ST 3X145 (Anchor) ×9 IMPLANT
BUR RADIUS 4.0X18.5 (BURR) ×3 IMPLANT
BUR RADIUS 5.5 (BURR) ×3 IMPLANT
CANNULA 5.75X7 CRYSTAL CLEAR (CANNULA) ×6 IMPLANT
CANNULA PARTIAL THREAD 2X7 (CANNULA) ×3 IMPLANT
CANNULA TWIST IN 8.25X9CM (CANNULA) ×6 IMPLANT
CLOSURE WOUND 1/2 X4 (GAUZE/BANDAGES/DRESSINGS)
CONNECTOR PERFECT PASSER (CONNECTOR) IMPLANT
COOLER POLAR GLACIER W/PUMP (MISCELLANEOUS) ×3 IMPLANT
CRADLE LAMINECT ARM (MISCELLANEOUS) ×3 IMPLANT
DEVICE SUCT BLK HOLE OR FLOOR (MISCELLANEOUS) ×3 IMPLANT
DRAPE IMP U-DRAPE 54X76 (DRAPES) ×6 IMPLANT
DRAPE INCISE IOBAN 66X45 STRL (DRAPES) ×3 IMPLANT
DRAPE SHEET LG 3/4 BI-LAMINATE (DRAPES) ×3 IMPLANT
DRAPE U-SHAPE 47X51 STRL (DRAPES) ×3 IMPLANT
DRSG OPSITE POSTOP 3X4 (GAUZE/BANDAGES/DRESSINGS) ×9 IMPLANT
DURAPREP 26ML APPLICATOR (WOUND CARE) ×9 IMPLANT
ELECT REM PT RETURN 9FT ADLT (ELECTROSURGICAL) ×3
ELECTRODE REM PT RTRN 9FT ADLT (ELECTROSURGICAL) ×1 IMPLANT
GAUZE PETRO XEROFOAM 1X8 (MISCELLANEOUS) ×3 IMPLANT
GAUZE SPONGE 4X4 12PLY STRL (GAUZE/BANDAGES/DRESSINGS) IMPLANT
GLOVE BIO SURGEON STRL SZ7 (GLOVE) ×3 IMPLANT
GLOVE BIOGEL PI IND STRL 7.0 (GLOVE) ×2 IMPLANT
GLOVE BIOGEL PI IND STRL 7.5 (GLOVE) ×1 IMPLANT
GLOVE BIOGEL PI IND STRL 9 (GLOVE) ×2 IMPLANT
GLOVE BIOGEL PI INDICATOR 7.0 (GLOVE) ×4
GLOVE BIOGEL PI INDICATOR 7.5 (GLOVE) ×2
GLOVE BIOGEL PI INDICATOR 9 (GLOVE) ×4
GLOVE SURG 9.0 ORTHO LTXF (GLOVE) ×6 IMPLANT
GOWN STRL REUS TWL 2XL XL LVL4 (GOWN DISPOSABLE) ×3 IMPLANT
GOWN STRL REUS W/ TWL LRG LVL3 (GOWN DISPOSABLE) ×2 IMPLANT
GOWN STRL REUS W/TWL LRG LVL3 (GOWN DISPOSABLE) ×4
IV LACTATED RINGER IRRG 3000ML (IV SOLUTION) ×42
IV LR IRRIG 3000ML ARTHROMATIC (IV SOLUTION) ×21 IMPLANT
KIT RM TURNOVER STRD PROC AR (KITS) ×3 IMPLANT
KIT STABILIZATION SHOULDER (MISCELLANEOUS) ×3 IMPLANT
KIT SUTURE 2.8 Q-FIX DISP (MISCELLANEOUS) IMPLANT
KIT SUTURETAK 3.0 INSERT PERC (KITS) ×3 IMPLANT
MANIFOLD NEPTUNE II (INSTRUMENTS) ×3 IMPLANT
MASK FACE SPIDER DISP (MASK) ×3 IMPLANT
MAT BLUE FLOOR 46X72 FLO (MISCELLANEOUS) ×6 IMPLANT
NDL SAFETY 18GX1.5 (NEEDLE) ×3 IMPLANT
NDL SAFETY 22GX1.5 (NEEDLE) ×3 IMPLANT
NS IRRIG 500ML POUR BTL (IV SOLUTION) ×3 IMPLANT
PACK ARTHROSCOPY SHOULDER (MISCELLANEOUS) ×3 IMPLANT
PAD WRAPON POLAR SHDR XLG (MISCELLANEOUS) ×1 IMPLANT
PASSER SUT CAPTURE FIRST (SUTURE) IMPLANT
SET TUBE SUCT SHAVER OUTFL 24K (TUBING) ×3 IMPLANT
SET TUBE TIP INTRA-ARTICULAR (MISCELLANEOUS) ×3 IMPLANT
SLING ULTRA II M (MISCELLANEOUS) ×3 IMPLANT
STRAP SAFETY BODY (MISCELLANEOUS) ×6 IMPLANT
STRIP CLOSURE SKIN 1/2X4 (GAUZE/BANDAGES/DRESSINGS) IMPLANT
SUT ETHILON 4-0 (SUTURE) ×2
SUT ETHILON 4-0 FS2 18XMFL BLK (SUTURE) ×1
SUT ETHILON NAB PS2 4-0 18IN (SUTURE) ×3 IMPLANT
SUT LASSO 90 DEG SD STR (SUTURE) ×6 IMPLANT
SUT MNCRL 4-0 (SUTURE)
SUT MNCRL 4-0 27XMFL (SUTURE)
SUT PDS AB 0 CT1 27 (SUTURE) IMPLANT
SUT PERFECTPASSER WHITE CART (SUTURE) IMPLANT
SUT VIC AB 0 CT1 36 (SUTURE) ×3 IMPLANT
SUT VIC AB 2-0 CT2 27 (SUTURE) ×3 IMPLANT
SUTURE ETHLN 4-0 FS2 18XMF BLK (SUTURE) ×1 IMPLANT
SUTURE MAGNUM WIRE 2X48 BLK (SUTURE) IMPLANT
SUTURE MNCRL 4-0 27XMF (SUTURE) IMPLANT
SYRINGE 10CC LL (SYRINGE) ×3 IMPLANT
TAPE MICROFOAM 4IN (TAPE) IMPLANT
TUBING ARTHRO INFLOW-ONLY STRL (TUBING) ×3 IMPLANT
TUBING CONNECTING 10 (TUBING) ×2 IMPLANT
TUBING CONNECTING 10' (TUBING) ×1
WAND HAND CNTRL MULTIVAC 90 (MISCELLANEOUS) ×3 IMPLANT
WRAPON POLAR PAD SHDR XLG (MISCELLANEOUS) ×3

## 2016-11-02 NOTE — Addendum Note (Signed)
Addendum  created 11/02/16 1343 by Rosaria Ferries, MD   Anesthesia Event edited

## 2016-11-02 NOTE — Anesthesia Procedure Notes (Addendum)
Anesthesia Regional Block: Interscalene brachial plexus block   Pre-Anesthetic Checklist: ,, timeout performed, Correct Patient, Correct Site, Correct Laterality, Correct Procedure, Correct Position, site marked, Risks and benefits discussed,  Surgical consent,  Pre-op evaluation,  At surgeon's request and post-op pain management  Laterality: Upper and Left  Prep: chloraprep       Needles:  Injection technique: Single-shot  Needle Type: Stimiplex     Needle Length: 5cm  Needle Gauge: 22     Additional Needles:   Procedures: ultrasound guided,,,,,,,,  Narrative:  Start time: 11/02/2016 7:08 AM End time: 11/02/2016 7:12 AM Injection made incrementally with aspirations every 5 mL.  Performed by: Personally  Anesthesiologist: Margorie John K  Additional Notes: Patient endorses baseline weakness in left shoulder  Functioning IV was confirmed and monitors were applied.  A 50mm 22ga Stimuplex needle was used. Sterile prep,hand hygiene and sterile gloves were used.  Negative aspiration and negative test dose prior to incremental administration of local anesthetic.  No paresthesias during the procedure.The patient tolerated the procedure well with no immediate complications.

## 2016-11-02 NOTE — Anesthesia Postprocedure Evaluation (Signed)
Anesthesia Post Note  Patient: TAVIANA WESTERGREN  Procedure(s) Performed: Procedure(s) (LRB): SHOULDER ARTHROSCOPY WITH SUPERIOR LABRAL REPAIR , distal clavicle excision, and subacrominal decompression (Left)  Patient location during evaluation: PACU Anesthesia Type: Regional Level of consciousness: awake and alert Pain management: pain level controlled Vital Signs Assessment: post-procedure vital signs reviewed and stable Respiratory status: spontaneous breathing, nonlabored ventilation, respiratory function stable and patient connected to nasal cannula oxygen Cardiovascular status: blood pressure returned to baseline and stable Postop Assessment: no signs of nausea or vomiting Anesthetic complications: no     Last Vitals:  Vitals:   11/02/16 1252 11/02/16 1312  BP: 118/68 116/61  Pulse: 95 90  Resp: 19 16  Temp: 37.3 C 36.6 C    Last Pain:  Vitals:   11/02/16 1312  TempSrc: Temporal  PainSc: 3                  Cleda Mccreedy Piscitello

## 2016-11-02 NOTE — H&P (Signed)
PREOPERATIVE H&P  Chief Complaint: [phone] Superior glenoid labrum lesion of left shoulder, init encntr  HPI: Lisa Rivers is a 46 y.o. female who presents for preoperative history and physical with a diagnosis of S43.432A Superior glenoid labrum lesion of left shoulder, init encntr. Symptoms are rated as moderate to severe, and have been worsening.  This is significantly impairing activities of daily living.  She has elected for surgical management.   Past Medical History:  Diagnosis Date  . ACL tear   . Adopted   . Arthritis   . Asthma   . Diabetes mellitus without complication (HCC)   . Headache    MIRGRAINES  . Hyperlipidemia    Past Surgical History:  Procedure Laterality Date  . APPENDECTOMY  12/2013  . FOOT SURGERY  [phone]   BOTH FEET   Social History   Social History  . Marital status: Married    Spouse name: Thayer Ohm  . Number of children: 0  . Years of education: 12   Occupational History  . full time-[employer] [place]    Social History Main Topics  . Smoking status: Never Smoker  . Smokeless tobacco: Never Used  . Alcohol use No  . Drug use: No  . Sexual activity: Not Asked   Other Topics Concern  . None   Social History Narrative  . None   Family History  Problem Relation Age of Onset  . Coronary artery disease Mother   . Alcohol abuse Mother   . Heart disease Mother   . Diabetes Father   . Alcohol abuse Father   . Diabetes Brother    Allergies  Allergen Reactions  . Tape Other (See Comments)    ADHESIVE  CAUSED BLISTERS   Prior to Admission medications   Medication Sig Start Date End Date Taking? Authorizing Provider  b complex vitamins capsule Take 1 capsule by mouth daily.   Yes Historical Provider, MD  Cholecalciferol (VITAMIN D PO) Take 2 tablets by mouth daily. 1500 iu per tablet   Yes Historical Provider, MD  fluticasone (FLONASE) 50 MCG/ACT nasal spray Place 2 sprays into both nostrils daily. Patient taking differently:  Place 2 sprays into both nostrils daily as needed.  09/07/16  Yes Margaretann Loveless, PA-C  metFORMIN (GLUCOPHAGE) 1000 MG tablet take 1 tablet by mouth twice a day 10/06/16  Yes Margaretann Loveless, PA-C  Multiple Vitamin (MULTIVITAMIN WITH MINERALS) TABS tablet Take 1 tablet by mouth daily.   Yes Historical Provider, MD  simvastatin (ZOCOR) 20 MG tablet take 1 tablet by mouth at bedtime 05/07/16  Yes Alessandra Bevels Burnette, PA-C  albuterol (PROAIR HFA) 108 (90 BASE) MCG/ACT inhaler Inhale 2 puffs into the lungs every 6 (six) hours as needed for wheezing or shortness of breath.     Historical Provider, MD  etodolac (LODINE XL) 500 MG 24 hr tablet Take 1 tablet (500 mg total) by mouth daily. Patient not taking: Reported on 10/26/2016 04/28/16   Margaretann Loveless, PA-C     Positive ROS: All other systems have been reviewed and were otherwise negative with the exception of those mentioned in the HPI and as above.  Physical Exam: General: Alert, no acute distress Cardiovascular: Regular rate and rhythm, no murmurs rubs or gallops.  No pedal edema Respiratory: Clear to auscultation bilaterally, no wheezes rales or rhonchi. No cyanosis, no use of accessory musculature GI: No organomegaly, abdomen is soft and non-tender nondistended with positive bowel sounds. Skin: Skin intact, no lesions within the  operative field. Neurologic: Sensation intact distally Psychiatric: Patient is competent for consent with normal mood and affect Lymphatic: No cervical lymphadenopathy  MUSCULOSKELETAL: Patient can forward elevate and abduct to approximately 140 with pain in the mid range of abduction. Positive O'Brien's test. Patient had positive impingement signs but no apprehension or instability. She had full digital wrist and elbow range of motion, intact sensation light touch in palpable radial pulse.  Assessment: S43.432A Superior glenoid labrum lesion of left shoulder, init encntr  Plan: Plan for  Procedure(s): LEFT SHOULDER ARTHROSCOPY WITH SLAP REPAIR  I discussed the procedure detail with the patient as well as the postoperative course.  I discussed the risks and benefits of surgery. The risks include but are not limited to infection, bleeding requiring blood transfusion, nerve or blood vessel injury, joint stiffness or loss of motion, persistent pain, weakness or instability, malunion, nonunion and hardware failure and the need for further surgery. Medical risks include but are not limited to DVT and pulmonary embolism, myocardial infarction, stroke, pneumonia, respiratory failure and death. Patient understood these risks and wished to proceed.   Juanell Fairly, MD   11/02/2016 7:40 AM

## 2016-11-02 NOTE — Op Note (Signed)
11/02/2016  12:04 PM  PATIENT:  Lisa Rivers  46 y.o. female  PRE-OPERATIVE DIAGNOSIS:   Superior glenoid labrum lesion of left shoulder with subacromial impingement with bursitis and acromioclavicular arthrosis  POST-OPERATIVE DIAGNOSIS:  Same  PROCEDURE:  Procedure(s): LEFT SHOULDER ARTHROSCOPY WITH SUPERIOR LABRAL REPAIR , distal clavicle excision, and subacrominal decompression (Left)  SURGEON:  Surgeon(s) and Role:    * Thornton Park, MD - Primary  ANESTHESIA:   local, general and paracervical block   PREOPERATIVE INDICATIONS:  ANETA HENDERSHOTT is a  46 y.o. female with a diagnosis of superior glenoid labrum tear of left shoulder with subacromial impingement with bursitis and acromioclavicular arthrosis who failed conservative measures and elected for surgical management.    I discussed the risks and benefits of surgery. The risks include but are not limited to infection, bleeding, nerve or blood vessel injury, joint stiffness or loss of motion, persistent pain, weakness or instability, and hardware failure and the need for further surgery. Medical risks include but are not limited to DVT and pulmonary embolism, myocardial infarction, stroke, pneumonia, respiratory failure and death. Patient understood these risks and wished to proceed.   OPERATIVE IMPLANTS: Arthrex bio suturetak anchors  3   OPERATIVE FINDINGS:  Left shoulder superior labral tear with instability of the biceps anchor, subacromial bursitis secondary to impingement and acromioclavicular joint arthrosis.   OPERATIVE PROCEDURE:  I met with the patient in the preoperative area.  I signed the left shoulder according the hospital's correct site of surgery protocol.  I answered all questions by the patient. Patient had a left interscalene block by the anesthesia service in the preoperative area. Patient was then brought to the operating room. She underwent general endotracheal intubation. She was then positioned in a  beachchair position. All bony prominences were adequately padded including the lower extremities.  Examination under anesthesia revealed no instability with load and shift testing, and a negative sulcus sign.  The patient was then prepped and draped in a sterile fashion. The patient received 2 g of Ancef prior to the onset of the case.  A timeout was performed to verify the patient's name, date of birth, medical record number, correct site of surgery and correct procedure to be performed.The timeout was also used to verify the patient received antibiotics that all appropriate instruments, implants and radiographic studies were available in the room. Once all in attendance were in agreement case began.  Bony landmarks were drawn out with a surgical marker along with proposed incisions. These were pre-injected with 1% lidocaine plain. An 11 blade was used to establish a posterior portal through which the arthroscope was placed in the glenohumeral joint. An anterior portal was established under direct visualization using an 18-gauge spinal needle for localization. A 5.75 mm arthroscopic cannula was inserted through the anterior portal.   A full diagnostic examination of the glenohumeral joint was performed.  Patient was found to have a superior labral tear extending more posteriorly than anteriorly. The biceps tendon was not torn. The rotator cuff was not torn. There is no focal chondral lesions. There is no anterior labral tear. Patient was found to have a Buford complex. There were no loose bodies or haggle lesion seen with examination of the inferior recess.  An anterolateral portal was established again under direct visualization using an 18-gauge spinal needle. A 7 mm cannula was placed through this anterolateral portal.  A hole was drilled in the superior anterior glenoid.  An Arthrex [employer] anchor was then  placed in the anterior superior glenoid.  A 90 suture lasso was used to shuttle one limb  of the suture anchor under the labrum. The sutures limbs became knotted. While trying to untangle the knots, the suture anchor was unloaded.  A percutaneous drill sleeve was then placed laterally using the Arthrex percutaneous drill sleeve kit.  Another anchor was placed at the base of the biceps and the superior glenoid using the technique as described above.. A 90 suture lasso was then used to pass a single limb of the suture under the labrum. This was then tied down using arthroscopic knot tying technique. This stabilized the biceps anchor. Another, third anchor, was then placed more posteriorly as the superior labral tear did extend more posteriorly than anteriorly.  Again a single limb of the suture was passed under the labrum using a 90 suture lasso. An arthroscopic knot tying technique was then he was to tie down sutures to approximate the superior labrum to the bony glenoid.  A probe was then used to examine the superior labral tear. The biceps anchor was found to be stable. The superior labrum was well approximated to the superior glenoid.  Final arthroscopic images of the repair were taken.  The glenohumeral joint was then copiously irrigated.  All arthroscopic instruments were removed.   The arthroscope was then placed in the subacromial space. A lateral portal was created under direct visualization using a spinal needle. Extensive bursitis was encountered. This was debrided using a 4 resector shaver blade and 90 ArthroCare wand. A 5.5 mm resector shaver blade was then placed through the lateral portal and a subacromial decompression was performed.  The before meals joint was found to be extremely narrowed with osteophytes. Therefore the 5.5 mm resector blade was brought in through the anterior portal and a distal clavicle excision was performed. Subacromial space was then lavaged. Final arthroscopic images were taken. All arthroscopic incisions were then removed.  The 4 arthroscopic portals, and  stab incision for the percutaneous drill sleeve, were closed with 4-0 nylon. A dry, sterile dressing was applied to the left shoulder, along with a Polar Care sleeve. Patient's left upper extremity was then placed in an abduction sling. She  was awoken and brought to PACU in stable condition. I was scrubbed and present for the entire case and all sharp and instrument counts were correct at the conclusion the case. I spoke with the patient's husband in the postop consultation room to let him know the operation was successful and performed without complication.      Timoteo Gaul, MD

## 2016-11-02 NOTE — Anesthesia Procedure Notes (Signed)
Procedure Name: Intubation Date/Time: 11/02/2016 7:55 AM Performed by: Ginger Carne Pre-anesthesia Checklist: Patient identified, Emergency Drugs available, Suction available, Patient being monitored and Timeout performed Patient Re-evaluated:Patient Re-evaluated prior to inductionOxygen Delivery Method: Circle system utilized Preoxygenation: Pre-oxygenation with 100% oxygen Intubation Type: IV induction Ventilation: Mask ventilation without difficulty Laryngoscope Size: Miller and 2 Grade View: Grade I Tube type: Oral Tube size: 7.0 mm Number of attempts: 1 Airway Equipment and Method: Stylet Placement Confirmation: ETT inserted through vocal cords under direct vision,  positive ETCO2 and breath sounds checked- equal and bilateral Secured at: 20 cm Tube secured with: Tape Dental Injury: Teeth and Oropharynx as per pre-operative assessment

## 2016-11-02 NOTE — Anesthesia Post-op Follow-up Note (Cosign Needed)
Anesthesia QCDR form completed.        

## 2016-11-02 NOTE — Progress Notes (Signed)
Blood sugar 256  novolog 3 units given

## 2016-11-02 NOTE — Anesthesia Preprocedure Evaluation (Addendum)
Anesthesia Evaluation  Patient identified by MRN, date of birth, ID band Patient awake    Reviewed: Allergy & Precautions, H&P , NPO status , Patient's Chart, lab work & pertinent test results  History of Anesthesia Complications Negative for: history of anesthetic complications  Airway Mallampati: III  TM Distance: >3 FB Neck ROM: full    Dental  (+) Chipped, Caps   Pulmonary neg shortness of breath, asthma ,    Pulmonary exam normal breath sounds clear to auscultation       Cardiovascular Exercise Tolerance: Good (-) angina(-) Past MI and (-) DOE negative cardio ROS Normal cardiovascular exam Rhythm:regular Rate:Normal     Neuro/Psych  Headaches,  Neuromuscular disease negative psych ROS   GI/Hepatic negative GI ROS, Neg liver ROS, neg GERD  ,  Endo/Other  diabetes, Type 2  Renal/GU      Musculoskeletal  (+) Arthritis ,   Abdominal   Peds  Hematology negative hematology ROS (+)   Anesthesia Other Findings Past Medical History: No date: ACL tear No date: Adopted No date: Arthritis No date: Asthma No date: Diabetes mellitus without complication (HCC) No date: Headache     Comment: MIRGRAINES No date: Hyperlipidemia  Past Surgical History: 12/2013: APPENDECTOMY 1610,9604: FOOT SURGERY     Comment: BOTH FEET  BMI    Body Mass Index:  32.92 kg/m      Reproductive/Obstetrics negative OB ROS                             Anesthesia Physical Anesthesia Plan  ASA: III  Anesthesia Plan: General ETT and Regional   Post-op Pain Management:    Induction: Intravenous  Airway Management Planned: Oral ETT  Additional Equipment:   Intra-op Plan:   Post-operative Plan: Extubation in OR  Informed Consent: I have reviewed the patients History and Physical, chart, labs and discussed the procedure including the risks, benefits and alternatives for the proposed anesthesia with the  patient or authorized representative who has indicated his/her understanding and acceptance.   Dental Advisory Given  Plan Discussed with: Anesthesiologist, CRNA and Surgeon  Anesthesia Plan Comments: (Patient consented for risks of anesthesia including but not limited to:  - adverse reactions to medications - damage to teeth, lips or other oral mucosa - sore throat or hoarseness - Damage to heart, brain, lungs or loss of life  Patient voiced understanding.)       Anesthesia Quick Evaluation

## 2016-11-02 NOTE — Discharge Instructions (Signed)

## 2016-11-02 NOTE — Transfer of Care (Signed)
Immediate Anesthesia Transfer of Care Note  Patient: Lisa Rivers  Procedure(s) Performed: Procedure(s): SHOULDER ARTHROSCOPY WITH SUPERIOR LABRAL REPAIR , distal clavicle excision, and subacrominal decompression (Left)  Patient Location: PACU  Anesthesia Type:General  Level of Consciousness: sedated  Airway & Oxygen Therapy: Patient Spontanous Breathing and Patient connected to face mask oxygen  Post-op Assessment: Report given to RN and Post -op Vital signs reviewed and stable  Post vital signs: Reviewed and stable  Last Vitals:  Vitals:   11/02/16 0735 11/02/16 0745  BP: 115/74   Pulse: 75 92  Resp: 14 14  Temp:      Last Pain:  Vitals:   11/02/16 0639  TempSrc: Tympanic  PainSc: 6          Complications: No apparent anesthesia complications

## 2016-11-03 ENCOUNTER — Encounter: Payer: Self-pay | Admitting: Orthopedic Surgery

## 2016-12-05 ENCOUNTER — Other Ambulatory Visit: Payer: Self-pay | Admitting: Physician Assistant

## 2016-12-28 ENCOUNTER — Telehealth: Payer: Self-pay

## 2016-12-28 NOTE — Telephone Encounter (Signed)
Patient advised of recall on fluticasone propionate nasal spray. Patient reports that she has checked her medication for the affected lot #. sd

## 2017-02-15 ENCOUNTER — Ambulatory Visit (INDEPENDENT_AMBULATORY_CARE_PROVIDER_SITE_OTHER): Payer: 59 | Admitting: Physician Assistant

## 2017-02-15 ENCOUNTER — Encounter: Payer: Self-pay | Admitting: Physician Assistant

## 2017-02-15 VITALS — BP 136/82 | HR 84 | Temp 98.5°F | Resp 16 | Wt 183.0 lb

## 2017-02-15 DIAGNOSIS — H60332 Swimmer's ear, left ear: Secondary | ICD-10-CM | POA: Diagnosis not present

## 2017-02-15 DIAGNOSIS — J069 Acute upper respiratory infection, unspecified: Secondary | ICD-10-CM

## 2017-02-15 MED ORDER — CIPROFLOXACIN-DEXAMETHASONE 0.3-0.1 % OT SUSP: 4.0000 [drp] | Freq: Two times a day (BID) | OTIC | 0 refills | Status: DC

## 2017-02-15 NOTE — Patient Instructions (Signed)
Otitis Externa Otitis externa is an infection of the outer ear canal. The outer ear canal is the area between the outside of the ear and the eardrum. Otitis externa is sometimes called "swimmer's ear." Follow these instructions at home:  If you were given antibiotic ear drops, use them as told by your doctor. Do not stop using them even if your condition gets better.  Take over-the-counter and prescription medicines only as told by your doctor.  Keep all follow-up visits as told by your doctor. This is important. How is this prevented?  Keep your ear dry. Use the corner of a towel to dry your ear after you swim or bathe.  Try not to scratch or put things in your ear. Doing these things makes it easier for germs to grow in your ear.  Avoid swimming in lakes, dirty water, or pools that may not have the right amount of a chemical called chlorine.  Consider making ear drops and putting 3 or 4 drops in each ear after you swim. Ask your doctor about how you can make ear drops. Contact a doctor if:  You have a fever.  After 3 days your ear is still red, swollen, or painful.  After 3 days you still have pus coming from your ear.  Your redness, swelling, or pain gets worse.  You have a really bad headache.  You have redness, swelling, pain, or tenderness behind your ear. This information is not intended to replace advice given to you by your health care provider. Make sure you discuss any questions you have with your health care provider. Document Released: 12/08/2007 Document Revised: 07/17/2015 Document Reviewed: 03/31/2015 Elsevier Interactive Patient Education  2018 Elsevier Inc.  

## 2017-02-15 NOTE — Progress Notes (Signed)
Patient: Lisa Rivers Female    DOB: [birth year]   46 y.o.   MRN: [id number] Visit Date: 02/15/2017  Today's Provider: Trey Sailors, PA-C   Chief Complaint  Patient presents with  . Ear Pain    Left ear pain started Saturday   Subjective:      Lisa Rivers is a 46 y/o woman c/o ear pain x 5 days and headache. Reports ear pain started suddenly, tender to touch. She does swim. No fever, chills, n/v, drainage. Having some headache and sinus pressure. Has taken Tylenol and one dose of 800 mg ibuprofen which she also has for her shoulder.   Otalgia   There is pain in the left ear. This is a new problem. The current episode started in the past 7 days. The problem occurs constantly. There has been no fever. The pain is at a severity of 3/10. Associated symptoms include headaches. Pertinent negatives include no abdominal pain, coughing, diarrhea, ear discharge, hearing loss, neck pain, rash, rhinorrhea, sore throat or vomiting. She has tried NSAIDs and acetaminophen for the symptoms. The treatment provided no relief.       Allergies  Allergen Reactions  . Tape Other (See Comments)    ADHESIVE  CAUSED BLISTERS     Current Outpatient Prescriptions:  .  albuterol (PROAIR HFA) 108 (90 BASE) MCG/ACT inhaler, Inhale 2 puffs into the lungs every 6 (six) hours as needed for wheezing or shortness of breath. , Disp: , Rfl:  .  b complex vitamins capsule, Take 1 capsule by mouth daily., Disp: , Rfl:  .  Cholecalciferol (VITAMIN D PO), Take 2 tablets by mouth daily. 1500 iu per tablet, Disp: , Rfl:  .  fluticasone (FLONASE) 50 MCG/ACT nasal spray, Place 2 sprays into both nostrils daily. (Patient taking differently: Place 2 sprays into both nostrils daily as needed. ), Disp: 16 g, Rfl: 6 .  metFORMIN (GLUCOPHAGE) 1000 MG tablet, take 1 tablet by mouth twice a day, Disp: 60 tablet, Rfl: 5 .  Multiple Vitamin (MULTIVITAMIN WITH MINERALS) TABS tablet, Take 1 tablet by mouth daily.,  Disp: , Rfl:  .  ondansetron (ZOFRAN) 4 MG tablet, Take 1 tablet (4 mg total) by mouth every 8 (eight) hours as needed for nausea or vomiting., Disp: 30 tablet, Rfl: 0 .  oxyCODONE (OXY IR/ROXICODONE) 5 MG immediate release tablet, Take 1-2 tablets (5-10 mg total) by mouth every 4 (four) hours as needed for severe pain., Disp: 60 tablet, Rfl: 0 .  simvastatin (ZOCOR) 20 MG tablet, take 1 tablet by mouth at bedtime, Disp: 30 tablet, Rfl: 5  Review of Systems  Constitutional: Positive for fatigue. Negative for activity change, appetite change, chills, diaphoresis, fever and unexpected weight change.  HENT: Positive for ear pain, postnasal drip, sinus pain and sinus pressure. Negative for congestion, ear discharge, hearing loss, nosebleeds, rhinorrhea, sneezing, sore throat, tinnitus, trouble swallowing and voice change.   Eyes: Positive for itching. Negative for photophobia, pain, discharge, redness and visual disturbance.  Respiratory: Negative.  Negative for cough.   Gastrointestinal: Negative.  Negative for abdominal pain, diarrhea and vomiting.  Musculoskeletal: Negative for neck pain.  Skin: Negative for rash.  Neurological: Positive for light-headedness and headaches. Negative for dizziness.    Social History  Substance Use Topics  . Smoking status: Never Smoker  . Smokeless tobacco: Never Used  . Alcohol use No   Objective:   BP 136/82 (BP Location: Left Arm, Patient Position: Sitting, Cuff  Size: Normal)   Pulse 84   Temp 98.5 F (36.9 C) (Oral)   Resp 16   Wt 183 lb (83 kg)   BMI 33.47 kg/m  Vitals:   02/15/17 1504  BP: 136/82  Pulse: 84  Resp: 16  Temp: 98.5 F (36.9 C)  TempSrc: Oral  Weight: 183 lb (83 kg)     Physical Exam  Constitutional: She is oriented to person, place, and time. She appears well-developed and well-nourished.  HENT:  Right Ear: Tympanic membrane and external ear normal. No swelling or tenderness.  Left Ear: Tympanic membrane normal. There  is swelling and tenderness.  Mouth/Throat: Oropharynx is clear and moist.  Left ear tender with tragal and auricle manipulation. Left TM normal, some mild wax accumulation.  Neurological: She is alert and oriented to person, place, and time.  Skin: Skin is warm and dry.  Psychiatric: She has a normal mood and affect. Her behavior is normal.        Assessment & Plan:     1. Acute swimmer's ear of left side  - ciprofloxacin-dexamethasone (CIPRODEX) OTIC suspension; Place 4 drops into the left ear 2 (two) times daily.  Dispense: 7.5 mL; Refill: 0  2. Viral URI  Can call back if progressing to sinus infection.  Return if symptoms worsen or fail to improve.  The entirety of the information documented in the History of Present Illness, Review of Systems and Physical Exam were personally obtained by me. Portions of this information were initially documented by Kavin LeechLaura Walsh, CMA and reviewed by me for thoroughness and accuracy.         Trey SailorsAdriana M Pollak, PA-C  [facility]

## 2017-03-03 ENCOUNTER — Telehealth: Payer: Self-pay

## 2017-03-03 NOTE — Telephone Encounter (Signed)
Patient has not been seen in the past year for diabetes follow up. Left message for patient to call back to schedule a follow up appointment with provider.  

## 2017-03-10 ENCOUNTER — Telehealth: Payer: Self-pay | Admitting: Physician Assistant

## 2017-03-10 DIAGNOSIS — H60339 Swimmer's ear, unspecified ear: Secondary | ICD-10-CM

## 2017-03-10 MED ORDER — AMOXICILLIN 875 MG PO TABS: 875.0000 mg | ORAL_TABLET | Freq: Two times a day (BID) | ORAL | 0 refills | Status: DC

## 2017-03-10 NOTE — Telephone Encounter (Signed)
Sent to [facility].

## 2017-03-10 NOTE — Telephone Encounter (Signed)
Pt stated that she will call back when she returns from vacation to schedule appt. Pt stated she is leaving Saturday for a month. Thanks TNP

## 2017-03-10 NOTE — Telephone Encounter (Signed)
Pt stated she saw Adriana on 02/15/17 for ear pain and was Dx with swimmers ear. Pt stated she took the drops as directed but her ear still hurts. Pt stated that Adriana advised pt to call back if her ear wasn't better and she would send in an Rx for antibiotic. Pt is requesting that Antony ContrasJenni send in the Rx since Ricki Rodriguezdriana is out of the office this week. [facility] [address] Pt stated she is going out of town for a month this Saturday. Please advise. Thanks TNP

## 2017-04-04 ENCOUNTER — Other Ambulatory Visit: Payer: Self-pay | Admitting: Physician Assistant

## 2017-05-18 ENCOUNTER — Encounter: Payer: Self-pay | Admitting: Physician Assistant

## 2017-05-18 ENCOUNTER — Ambulatory Visit (INDEPENDENT_AMBULATORY_CARE_PROVIDER_SITE_OTHER): Payer: 59 | Admitting: Physician Assistant

## 2017-05-18 VITALS — BP 130/86 | HR 94 | Temp 98.3°F | Resp 16 | Ht 62.0 in | Wt 180.0 lb

## 2017-05-18 DIAGNOSIS — E119 Type 2 diabetes mellitus without complications: Secondary | ICD-10-CM | POA: Diagnosis not present

## 2017-05-18 DIAGNOSIS — L2084 Intrinsic (allergic) eczema: Secondary | ICD-10-CM

## 2017-05-18 DIAGNOSIS — E7800 Pure hypercholesterolemia, unspecified: Secondary | ICD-10-CM | POA: Diagnosis not present

## 2017-05-18 DIAGNOSIS — Z1239 Encounter for other screening for malignant neoplasm of breast: Secondary | ICD-10-CM

## 2017-05-18 DIAGNOSIS — Z0000 Encounter for general adult medical examination without abnormal findings: Secondary | ICD-10-CM | POA: Diagnosis not present

## 2017-05-18 DIAGNOSIS — E559 Vitamin D deficiency, unspecified: Secondary | ICD-10-CM

## 2017-05-18 DIAGNOSIS — H9202 Otalgia, left ear: Secondary | ICD-10-CM | POA: Diagnosis not present

## 2017-05-18 DIAGNOSIS — Z1231 Encounter for screening mammogram for malignant neoplasm of breast: Secondary | ICD-10-CM | POA: Diagnosis not present

## 2017-05-18 DIAGNOSIS — M25569 Pain in unspecified knee: Secondary | ICD-10-CM | POA: Insufficient documentation

## 2017-05-18 DIAGNOSIS — M222X9 Patellofemoral disorders, unspecified knee: Secondary | ICD-10-CM | POA: Insufficient documentation

## 2017-05-18 MED ORDER — SIMVASTATIN 20 MG PO TABS: 20.0000 mg | ORAL_TABLET | Freq: Every day | ORAL | 5 refills | Status: DC

## 2017-05-18 NOTE — Patient Instructions (Signed)

## 2017-05-18 NOTE — Progress Notes (Signed)
Patient: Lisa Rivers, Female    DOB: [birth year], 46 y.o.   MRN: [id number] Visit Date: 05/18/2017  Today's Provider: Margaretann Loveless, PA-C   Chief Complaint  Patient presents with  . Annual Exam   Subjective:    Annual physical exam Lisa Rivers is a 46 y.o. female who presents today for health maintenance and complete physical. She feels well. She reports exercising daily. She reports she is sleeping well.  01/03/15 CPE 01/03/15 Pap-neg ----------------------------------------------------------------- Patient c/o left ear pain "feels like liquid in the ear", pt has been seen for this on 02/15/17 by Osvaldo Angst, PA-C and was treated. She reports no improvements. She has also been using flonase without relief.   Patient c/o recurrent rash on right lower leg x's several weeks. Patient reports she has been using hydrocortisone. She has been see by Dr. Ebony Cargo, dermatology.  She is also being followed by Dr. Verneita Griffes, ortho, for her right shoulder impingement with capsulitis. She reports improvements with PT.   Review of Systems  Constitutional: Negative.   HENT: Negative for congestion, ear discharge and ear pain (left ear, not pain but fluid sensation).   Eyes: Negative.   Respiratory: Negative.   Cardiovascular: Negative.   Gastrointestinal: Negative.   Endocrine: Negative.   Genitourinary: Negative.   Musculoskeletal: Negative.   Skin: Positive for rash.  Allergic/Immunologic: Positive for environmental allergies.  Neurological: Negative.   Hematological: Negative.   Psychiatric/Behavioral: Negative.     Social History      She  reports that  has never smoked. she has never used smokeless tobacco. She reports that she does not drink alcohol or use drugs.       Social History   Socioeconomic History  . Marital status: Married    Spouse name: Thayer Ohm  . Number of children: 0  . Years of education: 15  . Highest education level: Not on  file  Social Needs  . Financial resource strain: Not on file  . Food insecurity - worry: Not on file  . Food insecurity - inability: Not on file  . Transportation needs - medical: Not on file  . Transportation needs - non-medical: Not on file  Occupational History  . Occupation: full time-city of Product/process development scientist: [employer]  Tobacco Use  . Smoking status: Never Smoker  . Smokeless tobacco: Never Used  Substance and Sexual Activity  . Alcohol use: No    Alcohol/week: 0.0 oz  . Drug use: No  . Sexual activity: Not on file  Other Topics Concern  . Not on file  Social History Narrative  . Not on file    Past Medical History:  Diagnosis Date  . ACL tear   . Adopted   . Arthritis   . Asthma   . Diabetes mellitus without complication (HCC)   . Headache    MIRGRAINES  . Hyperlipidemia      Patient Active Problem List   Diagnosis Date Noted  . Knee pain 05/18/2017  . Patellofemoral stress syndrome 05/18/2017  . Right rotator cuff tendinitis 04/14/2016  . Allergic rhinitis 02/06/2015  . Abortion, spontaneous 12/30/2014  . Abortion 12/30/2014  . Carpal tunnel syndrome 12/30/2014  . Diabetes (HCC) 12/30/2014  . Hypercholesterolemia without hypertriglyceridemia 12/30/2014  . Fritzi Mandes 12/30/2014  . Avitaminosis D 12/30/2014    Past Surgical History:  Procedure Laterality Date  . APPENDECTOMY  12/2013  . FOOT SURGERY  [phone]   BOTH FEET  Family History        Family Status  Relation Name Status  . Mother  Deceased at age 48  . Father  Deceased at age 48  . Brother  Alive  . Sister  Alive  . Brother  Deceased at age 48       shot himself  . Brother  Deceased       shot himself  . Brother  Deceased       complications from Hepatitis C  . Brother  Alive        Her family history includes Alcohol abuse in her father and mother; Coronary artery disease in her mother; Diabetes in her brother and father; Heart disease in her mother.      Allergies  Allergen Reactions  . Tape Other (See Comments)    ADHESIVE  CAUSED BLISTERS     Current Outpatient Medications:  .  albuterol (PROAIR HFA) 108 (90 BASE) MCG/ACT inhaler, Inhale 2 puffs into the lungs every 6 (six) hours as needed for wheezing or shortness of breath. , Disp: , Rfl:  .  b complex vitamins capsule, Take 1 capsule by mouth daily., Disp: , Rfl:  .  Cholecalciferol (VITAMIN D PO), Take 2 tablets by mouth daily. 1500 iu per tablet, Disp: , Rfl:  .  fluticasone (FLONASE) 50 MCG/ACT nasal spray, Place 2 sprays into both nostrils daily. (Patient taking differently: Place 2 sprays into both nostrils daily as needed. ), Disp: 16 g, Rfl: 6 .  metFORMIN (GLUCOPHAGE) 1000 MG tablet, take 1 tablet by mouth twice a day, Disp: 60 tablet, Rfl: 5 .  Multiple Vitamin (MULTIVITAMIN WITH MINERALS) TABS tablet, Take 1 tablet by mouth daily., Disp: , Rfl:  .  simvastatin (ZOCOR) 20 MG tablet, take 1 tablet by mouth at bedtime, Disp: 30 tablet, Rfl: 5 .  hydrocortisone cream 0.5 %, Apply topically., Disp: , Rfl:    Patient Care Team: Reine JustBurnette, Akoni Parton M, PA-C as PCP - General (Physician Assistant)      Objective:   Vitals: BP 130/86 (BP Location: Left Arm, Patient Position: Sitting, Cuff Size: Normal)   Pulse 94   Temp 98.3 F (36.8 C) (Oral)   Resp 16   Ht 5\' 2"  (1.575 m)   Wt 180 lb (81.6 kg)   LMP 05/06/2017   SpO2 99%   BMI 32.92 kg/m    Vitals:   05/18/17 1409  BP: 130/86  Pulse: 94  Resp: 16  Temp: 98.3 F (36.8 C)  TempSrc: Oral  SpO2: 99%  Weight: 180 lb (81.6 kg)  Height: 5\' 2"  (1.575 m)     Physical Exam  Constitutional: She is oriented to person, place, and time. She appears well-developed and well-nourished. No distress.  HENT:  Head: Normocephalic and atraumatic.  Right Ear: Hearing, tympanic membrane, external ear and ear canal normal.  Left Ear: Hearing, tympanic membrane, external ear and ear canal normal.  Nose: Nose normal.   Mouth/Throat: Uvula is midline, oropharynx is clear and moist and mucous membranes are normal. No oropharyngeal exudate.  Eyes: Conjunctivae and EOM are normal. Pupils are equal, round, and reactive to light. Right eye exhibits no discharge. Left eye exhibits no discharge. No scleral icterus.  Neck: Normal range of motion. Neck supple. No JVD present. No tracheal deviation present. No thyromegaly present.  Cardiovascular: Normal rate, regular rhythm, normal heart sounds and intact distal pulses. Exam reveals no gallop and no friction rub.  No murmur heard. Pulmonary/Chest: Effort normal and breath  sounds normal. No respiratory distress. She has no wheezes. She has no rales. She exhibits no tenderness. Right breast exhibits no inverted nipple, no mass, no nipple discharge, no skin change and no tenderness. Left breast exhibits no inverted nipple, no mass, no nipple discharge, no skin change and no tenderness. Breasts are symmetrical.  Abdominal: Soft. Bowel sounds are normal. She exhibits no distension and no mass. There is no tenderness. There is no rebound and no guarding.  Musculoskeletal: Normal range of motion. She exhibits no edema or tenderness.  Lymphadenopathy:    She has no cervical adenopathy.  Neurological: She is alert and oriented to person, place, and time.  Skin: Skin is warm and dry. Rash noted. Rash is maculopapular. She is not diaphoretic.     Psychiatric: She has a normal mood and affect. Her behavior is normal. Judgment and thought content normal.  Vitals reviewed.    Depression Screen No flowsheet data found.    Assessment & Plan:     Routine Health Maintenance and Physical Exam  Exercise Activities and Dietary recommendations Goals    None      Immunization History  Administered Date(s) Administered  . Tdap 08/20/2010    Health Maintenance  Topic Date Due  . PNEUMOCOCCAL POLYSACCHARIDE VACCINE (1) 05/23/1971  . HIV Screening  05/22/1984  . URINE  MICROALBUMIN  01/03/2016  . FOOT EXAM  07/08/2016  . HEMOGLOBIN A1C  08/14/2016  . INFLUENZA VACCINE  02/02/2017  . OPHTHALMOLOGY EXAM  04/23/2017  . PAP SMEAR  01/02/2018  . TETANUS/TDAP  08/20/2020     Discussed health benefits of physical activity, and encouraged her to engage in regular exercise appropriate for her age and condition.    1. Annual physical exam Normal physical exam today. Will check labs as below and f/u pending lab results. If labs are stable and WNL she will not need to have these rechecked for one year at her next annual physical exam. She is to call the office in the meantime if she has any acute issue, questions or concerns. - CBC with Differential/Platelet - Comprehensive metabolic panel - Hemoglobin A1c - Lipid panel - TSH - VITAMIN D 25 Hydroxy (Vit-D Deficiency, Fractures)  2. Breast cancer screening Breast exam today was normal. There is no family history of breast cancer. She does perform regular self breast exams. Mammogram was ordered as below. Information for [facility] was given to patient so she may schedule her mammogram at her convenience. - MM DIGITAL SCREENING BILATERAL; Future  3. Type 2 diabetes mellitus without complication, unspecified whether long term insulin use (HCC) A1c had been stable. On metformin 1000mg  BID. Did have a few rounds of steroids this year so will recheck as below.  - Comprehensive metabolic panel - Hemoglobin A1c - Lipid panel  4. Hypercholesterolemia without hypertriglyceridemia Stable on simvastatin 20mg . Will check labs as below and f/u pending results. - Comprehensive metabolic panel - Hemoglobin A1c - Lipid panel - simvastatin (ZOCOR) 20 MG tablet; Take 1 tablet (20 mg total) at bedtime by mouth.  Dispense: 30 tablet; Refill: 5  5. Avitaminosis D H/O this. Takes OTC supplement. Will check labs as below and f/u pending results. - VITAMIN D 25 Hydroxy (Vit-D Deficiency, Fractures)  6. Intrinsic  eczema Followed by Dr. Ebony Cargo. On hydrocortisone 0.5%. This does help but symptoms restart after a week or two of not using.   7. Discomfort of left ear Feels a fullness, fluid sensation in the left ear. Denies tinnitus  or hearing loss. Discussed flonase use, adding sudafed short term and an OTC antihistamine such as claritin. Discussed ENT referral if symptoms persist.   --------------------------------------------------------------------    Margaretann LovelessJennifer M Eytan Carrigan, PA-C  [facility]

## 2017-06-01 ENCOUNTER — Other Ambulatory Visit: Payer: Self-pay | Admitting: Physician Assistant

## 2017-06-02 LAB — CBC WITH DIFFERENTIAL/PLATELET
BASOS ABS: 0 10*3/uL (ref 0.0–0.2)
Basos: 1 %
EOS (ABSOLUTE): 0.3 10*3/uL (ref 0.0–0.4)
Eos: 4 %
HEMOGLOBIN: 13.2 g/dL (ref 11.1–15.9)
Hematocrit: 38.8 % (ref 34.0–46.6)
Immature Grans (Abs): 0 10*3/uL (ref 0.0–0.1)
Immature Granulocytes: 0 %
LYMPHS ABS: 2 10*3/uL (ref 0.7–3.1)
Lymphs: 27 %
MCH: 29.6 pg (ref 26.6–33.0)
MCHC: 34 g/dL (ref 31.5–35.7)
MCV: 87 fL (ref 79–97)
MONOCYTES: 6 %
Monocytes Absolute: 0.5 10*3/uL (ref 0.1–0.9)
NEUTROS ABS: 4.6 10*3/uL (ref 1.4–7.0)
Neutrophils: 62 %
Platelets: 378 10*3/uL (ref 150–379)
RBC: 4.46 x10E6/uL (ref 3.77–5.28)
RDW: 12.9 % (ref 12.3–15.4)
WBC: 7.4 10*3/uL (ref 3.4–10.8)

## 2017-06-02 LAB — LIPID PANEL W/O CHOL/HDL RATIO
CHOLESTEROL TOTAL: 147 mg/dL (ref 100–199)
HDL: 47 mg/dL (ref >39)
LDL Calculated: 67 mg/dL (ref 0–99)
TRIGLYCERIDES: 167 mg/dL — AB (ref 0–149)
VLDL Cholesterol Cal: 33 mg/dL (ref 5–40)

## 2017-06-02 LAB — COMPREHENSIVE METABOLIC PANEL
ALBUMIN: 4.1 g/dL (ref 3.5–5.5)
ALK PHOS: 55 IU/L (ref 39–117)
ALT: 18 IU/L (ref 0–32)
AST: 18 IU/L (ref 0–40)
Albumin/Globulin Ratio: 1.6 (ref 1.2–2.2)
BILIRUBIN TOTAL: 0.5 mg/dL (ref 0.0–1.2)
BUN / CREAT RATIO: 14 (ref 9–23)
BUN: 11 mg/dL (ref 6–24)
CO2: 24 mmol/L (ref 20–29)
Calcium: 9.2 mg/dL (ref 8.7–10.2)
Chloride: 103 mmol/L (ref 96–106)
Creatinine, Ser: 0.79 mg/dL (ref 0.57–1.00)
GFR calc Af Amer: 102 mL/min/{1.73_m2} (ref >59)
GFR calc non Af Amer: 89 mL/min/{1.73_m2} (ref >59)
GLOBULIN, TOTAL: 2.6 g/dL (ref 1.5–4.5)
GLUCOSE: 134 mg/dL — AB (ref 65–99)
Potassium: 4.5 mmol/L (ref 3.5–5.2)
SODIUM: 143 mmol/L (ref 134–144)
Total Protein: 6.7 g/dL (ref 6.0–8.5)

## 2017-06-02 LAB — TSH: TSH: 1.3 u[IU]/mL (ref 0.450–4.500)

## 2017-06-02 LAB — VITAMIN D 25 HYDROXY (VIT D DEFICIENCY, FRACTURES): VIT D 25 HYDROXY: 47.6 ng/mL (ref 30.0–100.0)

## 2017-06-02 LAB — HGB A1C W/O EAG: Hgb A1c MFr Bld: 6.3 % — ABNORMAL HIGH (ref 4.8–5.6)

## 2017-06-03 LAB — HM DIABETES EYE EXAM

## 2017-06-06 ENCOUNTER — Telehealth: Payer: Self-pay

## 2017-06-06 ENCOUNTER — Encounter: Payer: Self-pay | Admitting: Physician Assistant

## 2017-06-06 NOTE — Telephone Encounter (Signed)
Pt advised.   Thanks,   -Roxy Filler  

## 2017-06-06 NOTE — Telephone Encounter (Signed)
-----   Message from Margaretann LovelessJennifer M Burnette, PA-C sent at 06/06/2017  8:37 AM EST ----- Cholesterol stable. A1c up to 6.3 from 6.1 last year. Work on lifestyle modifications including healthy dieting and exercise. All other labs are WNL.

## 2017-06-16 ENCOUNTER — Ambulatory Visit
Admission: RE | Admit: 2017-06-16 | Discharge: 2017-06-16 | Disposition: A | Discharge status: Home / Self Care | Payer: 59 | Source: Ambulatory Visit | Attending: Physician Assistant | Admitting: Physician Assistant

## 2017-06-16 DIAGNOSIS — Z1231 Encounter for screening mammogram for malignant neoplasm of breast: Secondary | ICD-10-CM | POA: Insufficient documentation

## 2017-06-16 DIAGNOSIS — Z1239 Encounter for other screening for malignant neoplasm of breast: Secondary | ICD-10-CM

## 2017-06-17 ENCOUNTER — Telehealth: Payer: Self-pay

## 2017-06-17 NOTE — Telephone Encounter (Signed)
-----   Message from Margaretann LovelessJennifer M Burnette, PA-C sent at 06/17/2017  9:28 AM EST ----- Normal mammogram. Repeat screening in one year.

## 2017-06-17 NOTE — Telephone Encounter (Signed)
Patient advised as directed below.  Thanks,  -Joseline 

## 2017-07-13 ENCOUNTER — Ambulatory Visit: Payer: 59 | Admitting: Physician Assistant

## 2017-07-13 ENCOUNTER — Encounter: Payer: Self-pay | Admitting: Physician Assistant

## 2017-07-13 VITALS — BP 124/74 | HR 90 | Temp 98.6°F | Wt 184.0 lb

## 2017-07-13 DIAGNOSIS — J029 Acute pharyngitis, unspecified: Secondary | ICD-10-CM

## 2017-07-13 DIAGNOSIS — R6889 Other general symptoms and signs: Secondary | ICD-10-CM | POA: Diagnosis not present

## 2017-07-13 DIAGNOSIS — R059 Cough, unspecified: Secondary | ICD-10-CM

## 2017-07-13 DIAGNOSIS — R05 Cough: Secondary | ICD-10-CM | POA: Diagnosis not present

## 2017-07-13 LAB — POCT INFLUENZA A/B
Influenza A, POC: NEGATIVE
Influenza B, POC: NEGATIVE

## 2017-07-13 LAB — POCT RAPID STREP A (OFFICE): Rapid Strep A Screen: NEGATIVE

## 2017-07-13 MED ORDER — HYDROCODONE-HOMATROPINE 5-1.5 MG/5ML PO SYRP: 5.0000 mL | ORAL_SOLUTION | Freq: Every evening | ORAL | 0 refills | Status: DC | PRN

## 2017-07-13 NOTE — Progress Notes (Signed)
[facility]  Chief Complaint  Patient presents with  . URI    Started Three days ago    Subjective:    Patient ID: KALYNNE WOMAC, female    DOB: 1971/01/14, 47 y.o.   MRN: [id number]  Upper Respiratory Infection: CHARLSIE FLEEGER is a 47 y.o. female with a past medical history significant for Type II DM complaining of symptoms of a URI. Symptoms include congestion, cough, sore throat and swollen glands. Onset of symptoms was 3 days ago, gradually worsening since that time. She also c/o congestion, cough described as productive, low grade fever, nasal congestion, post nasal drip and sore throat for the past 3 days .  She is drinking plenty of fluids. Evaluation to date: none. Treatment to date: cough suppressants and decongestants. The treatment has provided minimal.   Review of Systems  Constitutional: Positive for chills, diaphoresis, fatigue and fever (Highest temp was 101). Negative for activity change, appetite change and unexpected weight change.  HENT: Positive for congestion, postnasal drip, rhinorrhea, sinus pressure, sinus pain, sore throat, trouble swallowing and voice change. Negative for ear discharge, ear pain, hearing loss, mouth sores and nosebleeds.   Eyes: Negative.   Respiratory: Positive for cough, chest tightness, shortness of breath and wheezing. Negative for apnea, choking and stridor.   Gastrointestinal: Positive for nausea. Negative for abdominal distention, abdominal pain, anal bleeding, blood in stool, constipation, diarrhea, rectal pain and vomiting.  Musculoskeletal: Positive for myalgias.  Neurological: Negative for dizziness, light-headedness and headaches.       Objective:   BP 124/74 (BP Location: Left Arm, Patient Position: Sitting, Cuff Size: Normal)   Pulse 90   Temp 98.6 F (37 C) (Oral)   Wt 184 lb (83.5 kg)   SpO2 97%   BMI 33.65 kg/m   Patient Active Problem List   Diagnosis Date Noted  . Knee  pain 05/18/2017  . Patellofemoral stress syndrome 05/18/2017  . Right rotator cuff tendinitis 04/14/2016  . Allergic rhinitis 02/06/2015  . Abortion, spontaneous 12/30/2014  . Carpal tunnel syndrome 12/30/2014  . Diabetes (HCC) 12/30/2014  . Hypercholesterolemia without hypertriglyceridemia 12/30/2014  . Fritzi Mandes 12/30/2014  . Avitaminosis D 12/30/2014    Outpatient Encounter Medications as of 07/13/2017  Medication Sig Note  . albuterol (PROAIR HFA) 108 (90 BASE) MCG/ACT inhaler Inhale 2 puffs into the lungs every 6 (six) hours as needed for wheezing or shortness of breath.  10/22/2016: HASN'T HAD TO USE IN PAST 3 YEARS  . b complex vitamins capsule Take 1 capsule by mouth daily. 12/30/2014: Received from: [employer] Received Sig: Take by mouth.  . Cholecalciferol (VITAMIN D PO) Take 2 tablets by mouth daily. 1500 iu per tablet   . fluticasone (FLONASE) 50 MCG/ACT nasal spray Place 2 sprays into both nostrils daily. (Patient taking differently: Place 2 sprays into both nostrils daily as needed. )   . hydrocortisone cream 0.5 % Apply topically.   . metFORMIN (GLUCOPHAGE) 1000 MG tablet take 1 tablet by mouth twice a day   . Multiple Vitamin (MULTIVITAMIN WITH MINERALS) TABS tablet Take 1 tablet by mouth daily.   . simvastatin (ZOCOR) 20 MG tablet Take 1 tablet (20 mg total) at bedtime by mouth.   Marland Kitchen HYDROcodone-homatropine (HYCODAN) 5-1.5 MG/5ML syrup Take 5 mLs by mouth at bedtime as needed for cough.    No facility-administered encounter medications on file as of 07/13/2017.     Allergies  Allergen Reactions  . Tape Other (See  Comments)    ADHESIVE  CAUSED BLISTERS       Physical Exam  Constitutional: She is oriented to person, place, and time. She appears well-developed and well-nourished. She appears ill.  HENT:  Right Ear: Tympanic membrane normal.  Left Ear: Tympanic membrane normal.  Mouth/Throat: Posterior oropharyngeal edema and posterior  oropharyngeal erythema present. No oropharyngeal exudate.  Reddened ear canals, TM normal.   Eyes: Conjunctivae are normal.  Cardiovascular: Normal rate and regular rhythm.  Pulmonary/Chest: Effort normal and breath sounds normal.  Lymphadenopathy:    She has cervical adenopathy.  Neurological: She is alert and oriented to person, place, and time.  Skin: Skin is warm and dry.  Psychiatric: She has a normal mood and affect. Her behavior is normal.       Assessment & Plan:  1. Flu-like symptoms  Rapid flu negative. Explained false negatives during flu season, offered Tamiflu, patient declines. Will treat symptomatically,   - POCT Influenza A/B  2. Cough  - HYDROcodone-homatropine (HYCODAN) 5-1.5 MG/5ML syrup; Take 5 mLs by mouth at bedtime as needed for cough.  Dispense: 120 mL; Refill: 0 - POCT Influenza A/B  3. Sore throat  - POCT rapid strep A   The entirety of the information documented in the History of Present Illness, Review of Systems and Physical Exam were personally obtained by me. Portions of this information were initially documented by Kavin LeechLaura Walsh, CMA and reviewed by me for thoroughness and accuracy.   Return if symptoms worsen or fail to improve.

## 2017-07-13 NOTE — Patient Instructions (Signed)

## 2017-09-08 ENCOUNTER — Encounter: Payer: Self-pay | Admitting: Physician Assistant

## 2017-09-08 ENCOUNTER — Ambulatory Visit
Admission: RE | Admit: 2017-09-08 | Discharge: 2017-09-08 | Disposition: A | Discharge status: Home / Self Care | Payer: 59 | Source: Ambulatory Visit | Attending: Physician Assistant | Admitting: Physician Assistant

## 2017-09-08 ENCOUNTER — Ambulatory Visit: Payer: 59 | Admitting: Physician Assistant

## 2017-09-08 VITALS — BP 126/78 | HR 88 | Temp 98.4°F | Resp 16 | Wt 191.0 lb

## 2017-09-08 DIAGNOSIS — M25532 Pain in left wrist: Secondary | ICD-10-CM

## 2017-09-08 DIAGNOSIS — E119 Type 2 diabetes mellitus without complications: Secondary | ICD-10-CM | POA: Diagnosis not present

## 2017-09-08 DIAGNOSIS — M19012 Primary osteoarthritis, left shoulder: Secondary | ICD-10-CM

## 2017-09-08 MED ORDER — METFORMIN HCL 1000 MG PO TABS: 1000.0000 mg | ORAL_TABLET | Freq: Two times a day (BID) | ORAL | 3 refills | Status: DC

## 2017-09-08 NOTE — Progress Notes (Signed)
Patient: Lisa DustmanCynthia C Rivers Female    DOB: 1971-03-01   47 y.o.   MRN: [id number] Visit Date: 09/08/2017  Today's Provider: Margaretann LovelessJennifer M Burnette, PA-C   Chief Complaint  Patient presents with  . Wrist Pain    Left wrist; started Monday.   Subjective:    Wrist Pain   The pain is present in the left wrist. This is a new problem. The current episode started in the past 7 days. The problem has been gradually worsening. The quality of the pain is described as aching and sharp. Pertinent negatives include no joint locking, joint swelling, limited range of motion, numbness, stiffness or tingling. She has tried NSAIDS for the symptoms. The treatment provided no relief.  No inciting event.    Allergies  Allergen Reactions  . Tape Other (See Comments)    ADHESIVE  CAUSED BLISTERS     Current Outpatient Medications:  .  albuterol (PROAIR HFA) 108 (90 BASE) MCG/ACT inhaler, Inhale 2 puffs into the lungs every 6 (six) hours as needed for wheezing or shortness of breath. , Disp: , Rfl:  .  b complex vitamins capsule, Take 1 capsule by mouth daily., Disp: , Rfl:  .  Cholecalciferol (VITAMIN D PO), Take 2 tablets by mouth daily. 1500 iu per tablet, Disp: , Rfl:  .  fluticasone (FLONASE) 50 MCG/ACT nasal spray, Place 2 sprays into both nostrils daily. (Patient taking differently: Place 2 sprays into both nostrils daily as needed. ), Disp: 16 g, Rfl: 6 .  metFORMIN (GLUCOPHAGE) 1000 MG tablet, take 1 tablet by mouth twice a day, Disp: 60 tablet, Rfl: 5 .  Multiple Vitamin (MULTIVITAMIN WITH MINERALS) TABS tablet, Take 1 tablet by mouth daily., Disp: , Rfl:  .  simvastatin (ZOCOR) 20 MG tablet, Take 1 tablet (20 mg total) at bedtime by mouth., Disp: 30 tablet, Rfl: 5 .  HYDROcodone-homatropine (HYCODAN) 5-1.5 MG/5ML syrup, Take 5 mLs by mouth at bedtime as needed for cough., Disp: 120 mL, Rfl: 0 .  hydrocortisone cream 0.5 %, Apply topically., Disp: , Rfl:   Review of Systems    Constitutional: Negative.   Respiratory: Negative.   Cardiovascular: Negative.   Musculoskeletal: Positive for arthralgias. Negative for back pain, gait problem, joint swelling, myalgias, neck pain, neck stiffness and stiffness.  Neurological: Negative for tingling and numbness.    Social History   Tobacco Use  . Smoking status: Never Smoker  . Smokeless tobacco: Never Used  Substance Use Topics  . Alcohol use: No    Alcohol/week: 0.0 oz   Objective:   BP 126/78 (BP Location: Right Arm, Patient Position: Sitting, Cuff Size: Normal)   Pulse 88   Temp 98.4 F (36.9 C) (Oral)   Resp 16   Wt 191 lb (86.6 kg)   LMP 08/14/2017   BMI 34.93 kg/m  Vitals:   09/08/17 1525  BP: 126/78  Pulse: 88  Resp: 16  Temp: 98.4 F (36.9 C)  TempSrc: Oral  Weight: 191 lb (86.6 kg)     Physical Exam  Constitutional: She appears well-developed and well-nourished. No distress.  Neck: Normal range of motion. Neck supple.  Cardiovascular: Normal rate, regular rhythm and normal heart sounds. Exam reveals no gallop and no friction rub.  No murmur heard. Pulmonary/Chest: Effort normal and breath sounds normal. No respiratory distress. She has no wheezes. She has no rales.  Musculoskeletal:       Left wrist: She exhibits decreased range of motion, tenderness and  bony tenderness. She exhibits no swelling, no effusion, no crepitus, no deformity and no laceration.       Left hand: She exhibits bony tenderness. She exhibits normal range of motion, no tenderness, normal two-point discrimination, normal capillary refill and no swelling. Normal sensation noted. Normal strength noted.  Pain with finger/thumb opposition, pain with radial deviation, pain over 1st MC-carpal joint Negative Finklestein test  Skin: She is not diaphoretic.  Vitals reviewed.       Assessment & Plan:     1. Arthralgia of left wrist Since she is having pain all on left UE will test to see if possible RA. No family history.  Will also obtain imaging of left wrist to r/o arthritic changes. Advised to wear wrist brace for restriction of motions. Ice and heat prn. IBU 600mg  TID. May consider referral to orthopedic if no improvements.  - Rheumatoid Factor - DG Wrist Complete Left; Future  2. Arthritis of left shoulder region See above medical treatment plan. - Rheumatoid Factor  3. Type 2 diabetes mellitus without complication, unspecified whether long term insulin use (HCC) Stable. Diagnosis pulled for medication refill. Continue current medical treatment plan. - metFORMIN (GLUCOPHAGE) 1000 MG tablet; Take 1 tablet (1,000 mg total) by mouth 2 (two) times daily.  Dispense: 90 tablet; Refill: 3       Margaretann Loveless, PA-C  [facility]

## 2017-09-08 NOTE — Patient Instructions (Signed)
What You Need to Know About Osteoarthritis Osteoarthritis is a type of arthritis that affects tissue that covers the ends of bones in joints (cartilage). Cartilage acts as a cushion between the bones and helps them move smoothly. Osteoarthritis results when cartilage in the joints gets worn down. Osteoarthritis is sometimes called "wear and tear" arthritis. Osteoarthritis can affect any joint and can make movement painful. Hips, knees, fingers, and toes are some of the joints that are most often affected by osteoarthritis. You may be more likely to develop osteoarthritis if:  You are middle-aged or older.  You are obese.  You have injured a joint or had surgery on a joint.  You have a family history of osteoarthritis.  How can osteoarthritis affect me? Osteoarthritis can cause:  Pain and swelling in your joint.  Difficulty moving your joint.  A grating or scraping feeling inside the joint when you move it.  Popping or creaking sounds when you move.  This condition can make it harder to do things that you need or want to do each day. Osteoarthritis in a major joint, such as your knee or hip, can make it painful to walk or exercise. If you have osteoarthritis in your hands, you might not be able to grip items, twist your hand, or control small movements of your hands and fingers (fine motor skills). Over time, osteoarthritis could cause you to be less physically active. Being less active increases your risk for other long-term (chronic) health problems, such as diabetes and heart disease. What lifestyle changes can be made? You can lessen the impact that osteoarthritis has on your daily life by:  Switching to low-impact activities that do not put repeated pressure on your joints. For example, if you usually run or jog for exercise, try swimming or riding a bike instead.  Staying active. Build up to at least 150 minutes of physical activity each week to keep your joints healthy and keep  your body strong.  Losing weight. If you are overweight or obese, losing weight can take pressure off of your joints. If you need help with weight loss, talk with your health care provider or a diet and nutrition specialist (dietitian).  What other changes can be made? You can also lessen the effect of osteoarthritis by:  Using assistive devices. Sometimes a brace, wrap, splint, specialized glove, or cane can help. Talk with your health care provider or physical therapist about when and how to use these.  Working with a physical therapist who can help you find ways to do your daily activities without harming your joints. A physical therapist can also teach you exercises and stretches to strengthen the muscles that support your joints.  Treating pain and inflammation. Take over-the-counter and prescription medicines for pain and inflammation only as told by your health care provider. If directed, you may put ice on the affected joint: ? If you have a removable assistive device, remove it as told by your health care provider. ? Put ice in a plastic bag. ? Place a towel between your skin and the bag. ? Leave the ice on for 20 minutes, 2-3 times a day.  If other measures do not work, you may need joint surgery, such as joint replacement. What can happen if changes are not made? Osteoarthritis is a condition that gets worse over time (a progressive condition). If you do not take steps to strengthen your body and to slow down the progress of the disease, your condition may get worse more   quickly. Your joints may stiffen and become swollen, which will make them painful and hard to move. Where to find more information: You can learn more about osteoarthritis from:  The Arthritis Foundation: www.arthritis.org/about-arthritis/types/osteoarthritis  National Institute of Arthritis and Musculoskeletal and Skin Diseases: www.niams.nih.gov/health_info/osteoarthritis/osteoarthritis_ff.asp  Contact a  health care provider if:  You cannot do your normal activities comfortably.  Your joint does not function at all.  Your pain is interfering with your sleep.  You are gaining weight.  Your joint appears to be changing in shape, instead of just being swollen and sore. Summary  Osteoarthritis is a painful joint disease that gets worse over time.  This condition can lead to other long-term (chronic) health problems.  There are changes that you can make to slow down the progression of the disease. This information is not intended to replace advice given to you by your health care provider. Make sure you discuss any questions you have with your health care provider. Document Released: 02/10/2016 Document Revised: 02/12/2016 Document Reviewed: 02/10/2016 Elsevier Interactive Patient Education  2018 [employer].  

## 2017-09-09 ENCOUNTER — Telehealth: Payer: Self-pay

## 2017-09-09 DIAGNOSIS — M654 Radial styloid tenosynovitis [de Quervain]: Secondary | ICD-10-CM

## 2017-09-09 LAB — RHEUMATOID FACTOR: Rhuematoid fact SerPl-aCnc: <10 IU/mL (ref 0.0–13.9)

## 2017-09-09 MED ORDER — METHYLPREDNISOLONE 4 MG PO TBPK: ORAL_TABLET | ORAL | 0 refills | Status: DC

## 2017-09-09 NOTE — Telephone Encounter (Signed)
Patient advised as directed below. Per patient she states you discussed with her about some steroid taper. She wants to go ahead and do that.[facility] [place] [facility] [place]  Thanks,  -Takyra Cantrall

## 2017-09-09 NOTE — Telephone Encounter (Signed)
Sent in. Watch sugars and be strict with diet while on it.

## 2017-09-09 NOTE — Telephone Encounter (Signed)
-----   Message from Margaretann LovelessJennifer M Burnette, [place]-C sent at 09/09/2017 12:29 PM EST ----- No arthritis noted on xray. Suspect De Quervain's tenosynovitis as discussed. Continue conservative management over next 2 weeks and if no improvements and desired call for us to refer you to Dr. Stephenie AcresSoria as we discussed. Also of note rheumatoid factor is negative.

## 2017-09-09 NOTE — Telephone Encounter (Signed)
Patient advised as directed below.  Thanks,  -Jaylin Benzel 

## 2017-10-05 ENCOUNTER — Other Ambulatory Visit: Payer: Self-pay | Admitting: Physician Assistant

## 2017-10-05 DIAGNOSIS — E119 Type 2 diabetes mellitus without complications: Secondary | ICD-10-CM

## 2017-10-17 IMAGING — CR DG SHOULDER 2+V*L*
1 series · 3 of 3 positions shown · non-contrast
Comparison: None.

CLINICAL DATA: Posterior left shoulder pain for 5 years without
history of injury. Numbness of the left shoulder 6 months ago.

EXAM:
LEFT SHOULDER - 2+ VIEW

[Series 1: dg shoulder left · 0.14mm/px · 3 of 3 slices shown]
[im 1/3]
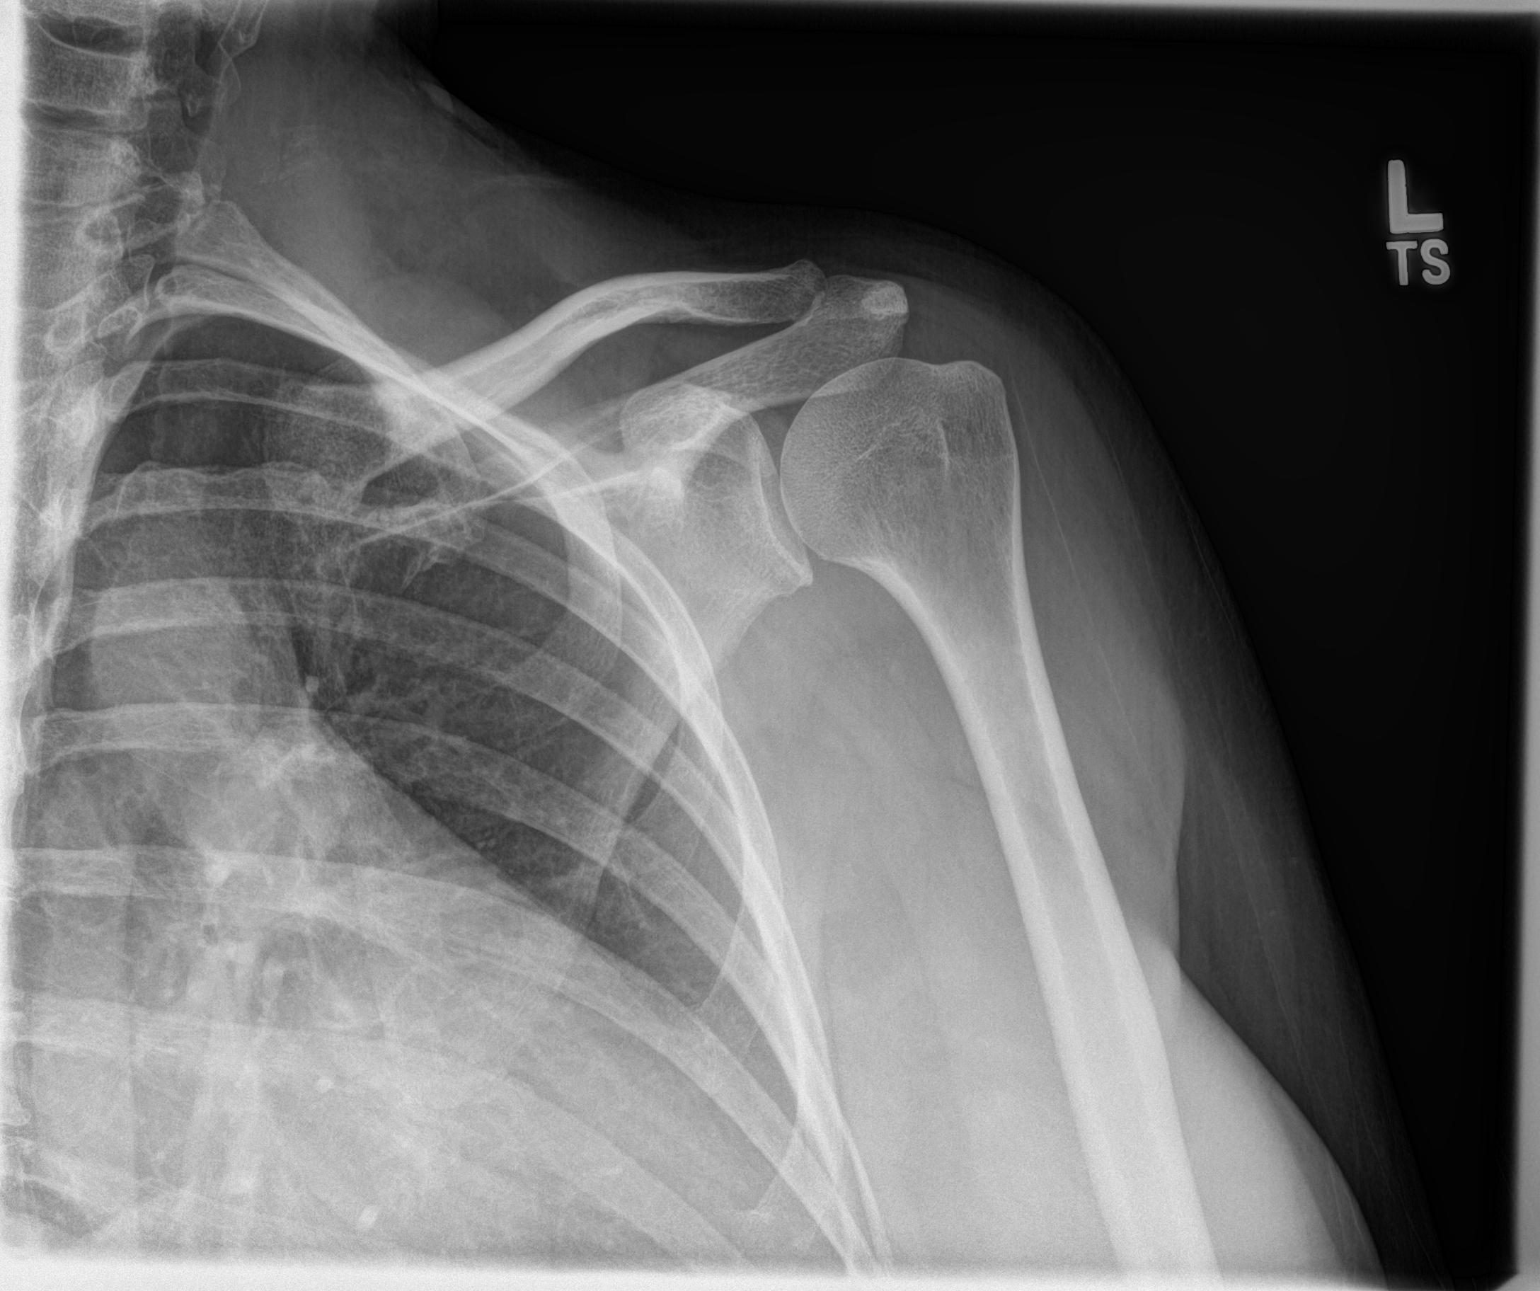
[im 2/3]
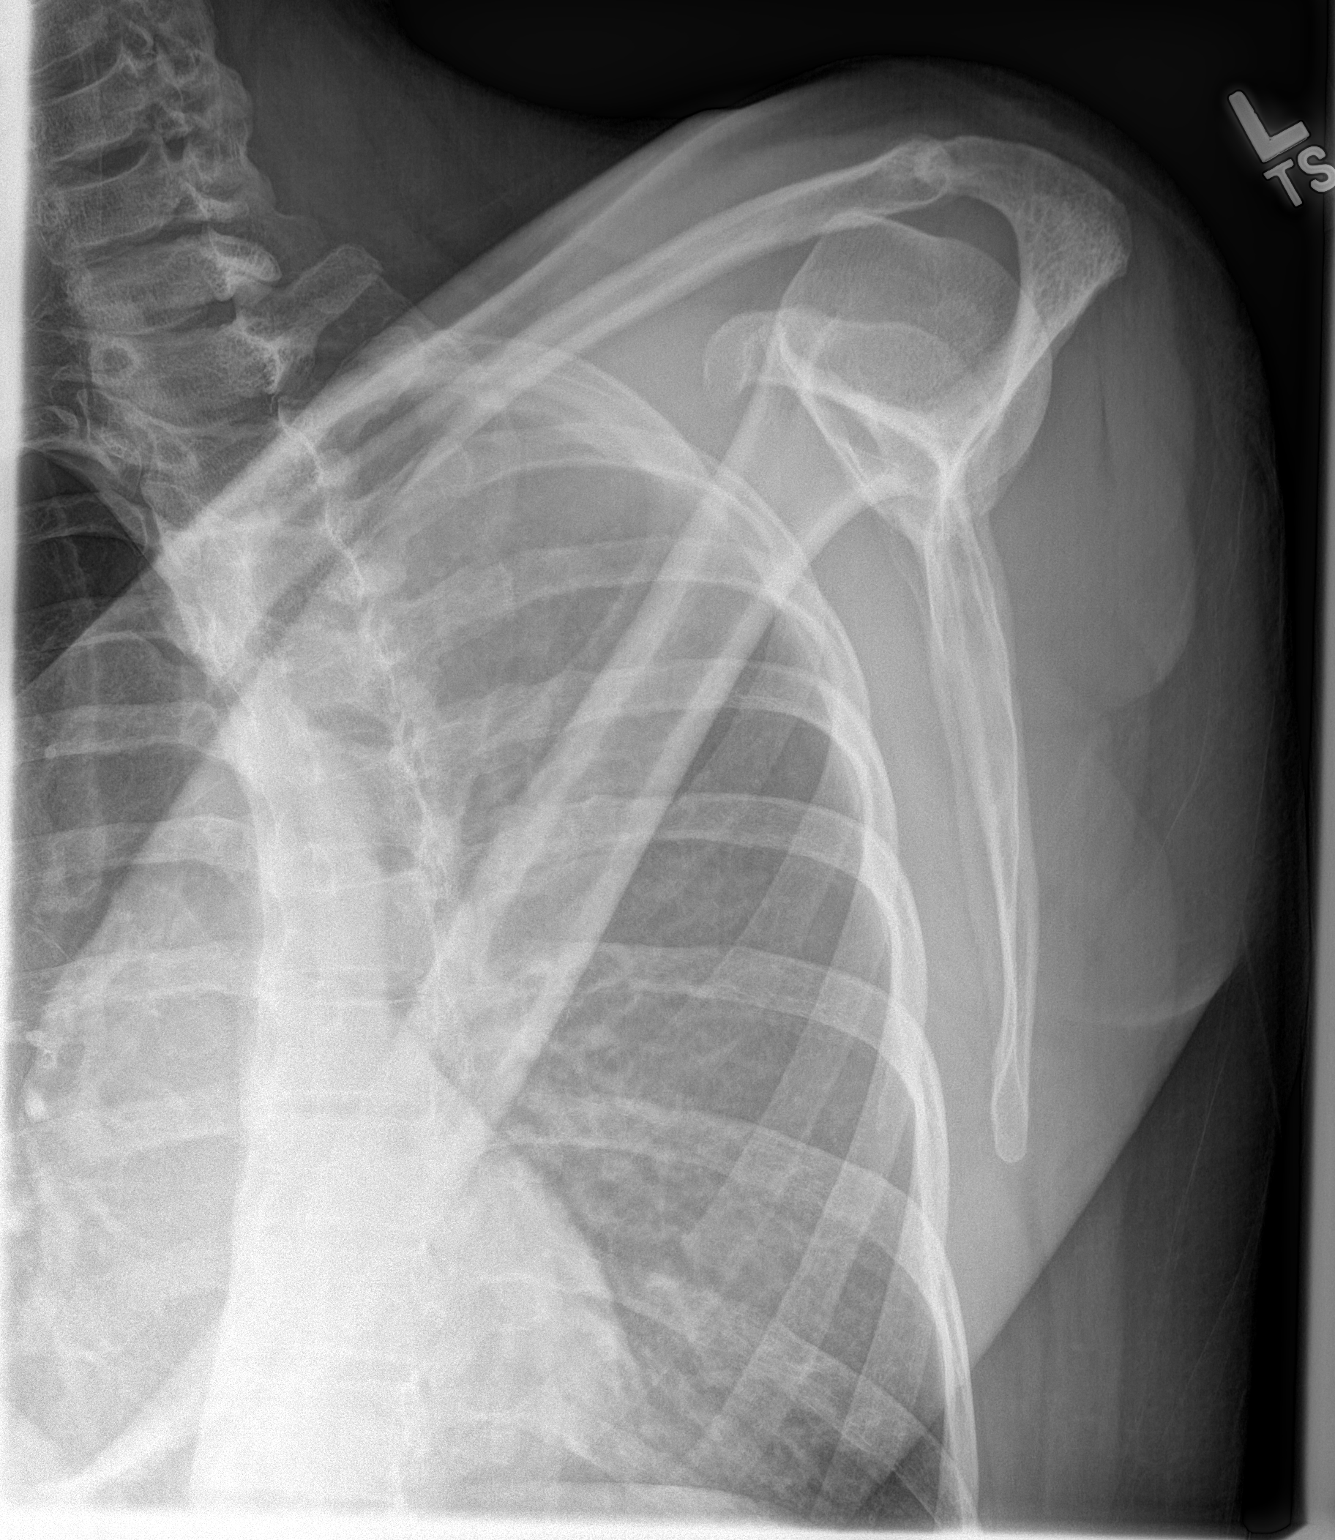
[im 3/3]
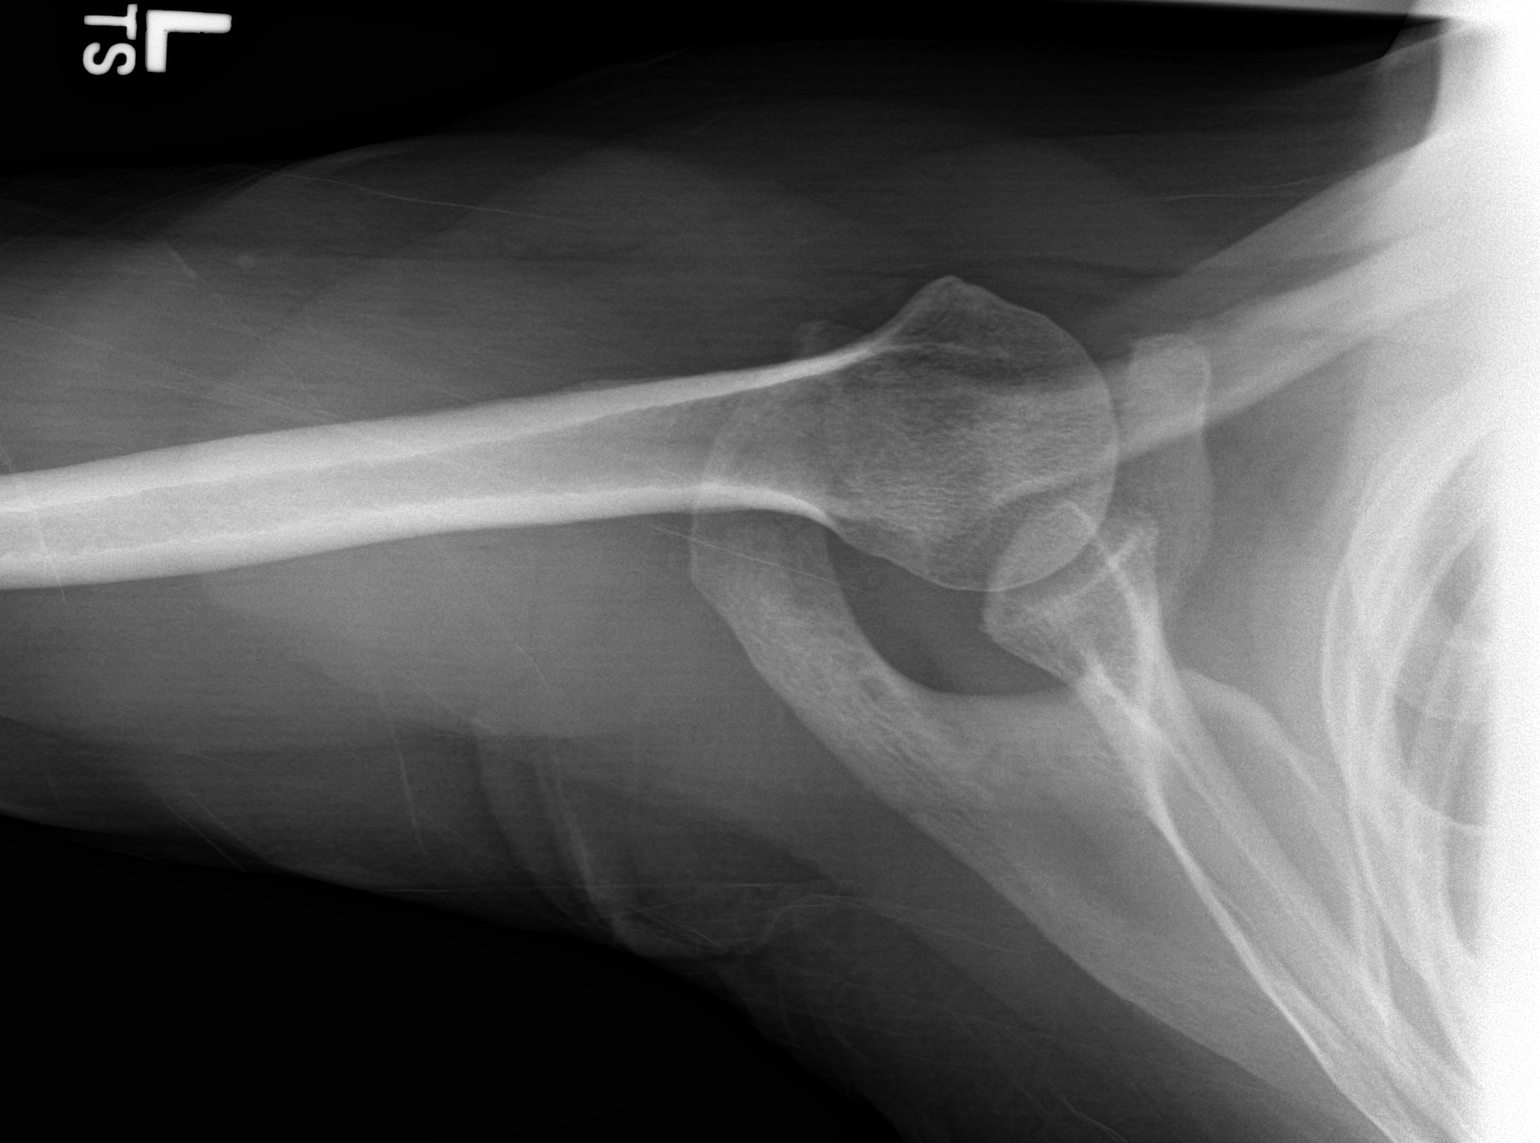

[3 of 3 positions shown; findings below may reference images not displayed]

FINDINGS: There is no evidence of fracture or dislocation. There is no
evidence of arthropathy or other focal bone abnormality. Soft
tissues are unremarkable. Suggestion of mild facet hypertrophy
involving C5 through C7 on the left of the partially included
cervical spine possibly contributing to patient's left upper
extremity symptoms.
IMPRESSION: No acute osseous abnormality of the left shoulder. Suggestion of
lower cervical facet arthropathy on the left of the visualized
cervical spine.

## 2017-12-04 ENCOUNTER — Other Ambulatory Visit: Payer: Self-pay | Admitting: Physician Assistant

## 2017-12-04 DIAGNOSIS — E7800 Pure hypercholesterolemia, unspecified: Secondary | ICD-10-CM

## 2018-01-29 ENCOUNTER — Other Ambulatory Visit: Payer: Self-pay | Admitting: Physician Assistant

## 2018-01-29 DIAGNOSIS — E119 Type 2 diabetes mellitus without complications: Secondary | ICD-10-CM

## 2018-03-20 IMAGING — RF DG FLUORO GUIDE NDL PLC/BX
2 series · 2 of 2 positions shown · non-contrast
Comparison: none

CLINICAL DATA: Pain with concern for glenoid labral tear

[Series 1: fluoro_iodine 2fps_bw · 0.09mm/px · 1 of 1 slices shown (1 of 2)]
[im 1/1]
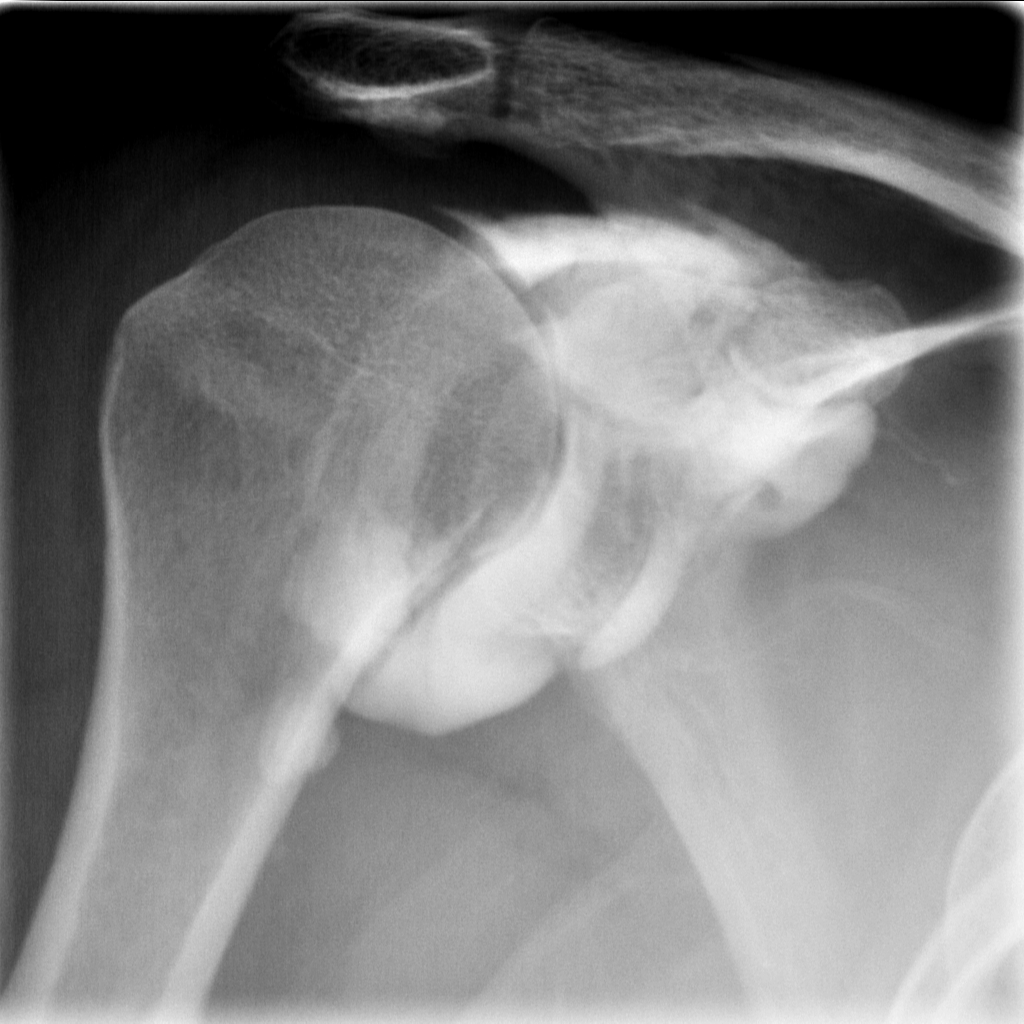

[Series 2: fluoro_iodine 2fps_bw · 0.09mm/px · 1 of 1 slices shown (2 of 2)]
[im 1/1]
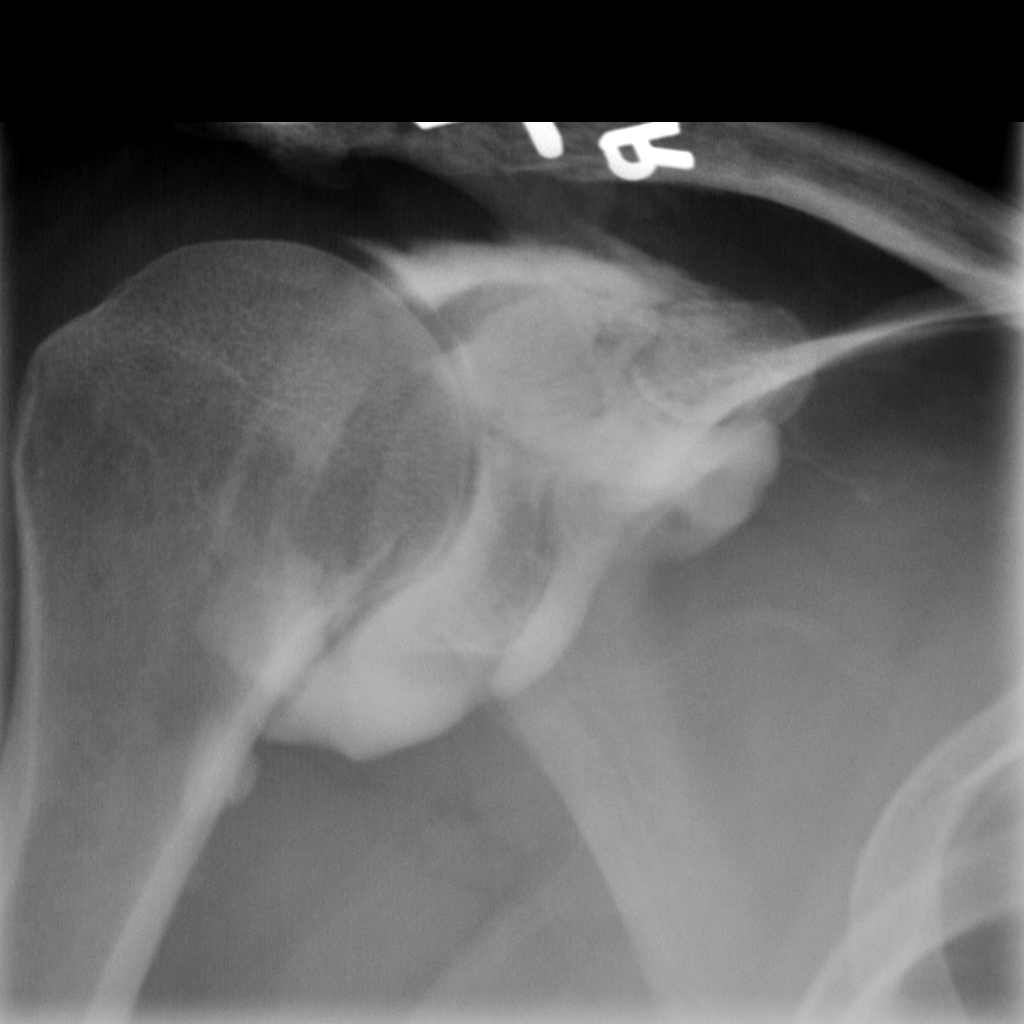

[2 of 2 positions shown; findings below may reference images not displayed]

EXAM:
INTRA-ARTICULAR CONTRAST ADMINISTRATION LEFT SHOULDER FOR SUBSEQUENT
MR LEFT SHOULDER ARTHROGRAPHY.

FLUOROSCOPY TIME:  Fluoroscopy Time:  1 MINUTES 48 SECONDS

Radiation Exposure Index (if provided by the fluoroscopic device):
36.20 mGy

Number of Acquired Spot Images: 2

PROCEDURE:
Following informed consent, the patient's shoulder area was marked
under fluoroscopic guidance. The left shoulder was subsequently
prepped and draped in a sterile manner. 1% lidocaine was used for
local anesthesia. Under fluoroscopic guidance, a 20 gauge spinal
needle was inserted into the left shoulder joint. A combination of 6
mL Isovue 180, 4 mL of 1% lidocaine, 5 mL of normal saline, and
mL of MultiHance gadolinium agent was instilled into the joint under
fluoroscopic guidance. Image confirmation of intra-articular
placement obtained. After contrast administration, the needle was
removed, and the patient was transferred to the MR suite in stable
condition. No rotator cuff tear seen on initial radiographic image
following contrast administration.
IMPRESSION: Intra-articular contrast administration left shoulder for subsequent
left shoulder MR arthrogram. No rotator cuff tear seen after
immediate contrast administration. Patient tolerated well without
complications evident.

## 2018-04-10 ENCOUNTER — Telehealth: Payer: Self-pay | Admitting: Physician Assistant

## 2018-04-10 DIAGNOSIS — J309 Allergic rhinitis, unspecified: Secondary | ICD-10-CM

## 2018-04-10 MED ORDER — FLUTICASONE PROPIONATE 50 MCG/ACT NA SUSP: 2.0000 | Freq: Every day | NASAL | 5 refills | Status: DC | PRN

## 2018-04-10 NOTE — Telephone Encounter (Signed)
refilled 

## 2018-04-10 NOTE — Telephone Encounter (Signed)
Pt contacted office for refill request on the following medications:  fluticasone (FLONASE) 50 MCG/ACT nasal spray   [facility] [place]  Last Rx: 09/07/16 LOV: 09/08/17 Please advise. Thanks TNP

## 2018-05-28 ENCOUNTER — Other Ambulatory Visit: Payer: Self-pay | Admitting: Physician Assistant

## 2018-05-28 DIAGNOSIS — E7800 Pure hypercholesterolemia, unspecified: Secondary | ICD-10-CM

## 2018-07-28 ENCOUNTER — Encounter: Payer: Self-pay | Admitting: Physician Assistant

## 2018-07-28 ENCOUNTER — Ambulatory Visit: Payer: 59 | Admitting: Physician Assistant

## 2018-07-28 VITALS — BP 149/87 | HR 93 | Temp 98.6°F | Resp 16 | Wt 193.0 lb

## 2018-07-28 DIAGNOSIS — M5442 Lumbago with sciatica, left side: Secondary | ICD-10-CM | POA: Diagnosis not present

## 2018-07-28 MED ORDER — METHYLPREDNISOLONE 4 MG PO TBPK: ORAL_TABLET | ORAL | 0 refills | Status: DC

## 2018-07-28 MED ORDER — CYCLOBENZAPRINE HCL 5 MG PO TABS: 5.0000 mg | ORAL_TABLET | Freq: Three times a day (TID) | ORAL | 1 refills | Status: DC | PRN

## 2018-07-28 MED ORDER — KETOROLAC TROMETHAMINE 60 MG/2ML IM SOLN
60.0000 mg | Freq: Once | INTRAMUSCULAR | Status: AC
  Administered 2018-07-28: 60 mg via INTRAMUSCULAR

## 2018-07-28 NOTE — Progress Notes (Signed)
Patient: Lisa Rivers Female    DOB: October 25, 1970   48 y.o.   MRN: [id number] Visit Date: 07/28/2018  Today's Provider: Margaretann Loveless, PA-C   Chief Complaint  Patient presents with  . Back Pain   Subjective:     Back Pain  This is a new problem. The current episode started in the past 7 days (4 days). The problem occurs constantly. The pain is present in the lumbar spine. The quality of the pain is described as aching. The pain radiates to the left thigh. The pain is at a severity of 5/10. The pain is moderate. The symptoms are aggravated by position, standing, bending and twisting. Associated symptoms include headaches, leg pain and numbness. Pertinent negatives include no abdominal pain, bladder incontinence, bowel incontinence, chest pain, dysuria, fever, paresis, paresthesias, pelvic pain, perianal numbness, tingling, weakness or weight loss. She has tried heat and NSAIDs (Heat, Tylenol, Ibuprofen) for the symptoms. The treatment provided mild relief.   Patient has had low back pain for 4 days. The day before the pain started patient had been raking leaves. She did not notice any pain with this activity. When she awoke the next morning she had severe back pain.Pain radiates down both legs, left greater than right. Patient is having numbness in left foot intermittently. Patient is having pain with all movement. Also noting some weakness when going from sitting or squatting to standing.    Allergies  Allergen Reactions  . Tape Other (See Comments)    ADHESIVE  CAUSED BLISTERS     Current Outpatient Medications:  .  albuterol (PROAIR HFA) 108 (90 BASE) MCG/ACT inhaler, Inhale 2 puffs into the lungs every 6 (six) hours as needed for wheezing or shortness of breath. , Disp: , Rfl:  .  b complex vitamins capsule, Take 1 capsule by mouth daily., Disp: , Rfl:  .  Cholecalciferol (VITAMIN D PO), Take 2 tablets by mouth daily. 1500 iu per tablet, Disp: , Rfl:  .   fluticasone (FLONASE) 50 MCG/ACT nasal spray, Place 2 sprays into both nostrils daily as needed., Disp: 16 g, Rfl: 5 .  metFORMIN (GLUCOPHAGE) 1000 MG tablet, TAKE 1 TABLET(1000 MG) BY MOUTH TWICE DAILY, Disp: 180 tablet, Rfl: 3 .  Multiple Vitamin (MULTIVITAMIN WITH MINERALS) TABS tablet, Take 1 tablet by mouth daily., Disp: , Rfl:  .  simvastatin (ZOCOR) 20 MG tablet, TAKE 1 TABLET BY MOUTH AT BEDTIME, Disp: 90 tablet, Rfl: 1 .  hydrocortisone cream 0.5 %, Apply topically., Disp: , Rfl:  .  methylPREDNISolone (MEDROL) 4 MG TBPK tablet, 6 day taper; take as directed on package instructions (Patient not taking: Reported on 07/28/2018), Disp: 21 tablet, Rfl: 0  Review of Systems  Constitutional: Negative for appetite change, chills, fatigue, fever and weight loss.  Respiratory: Negative for chest tightness and shortness of breath.   Cardiovascular: Negative for chest pain and palpitations.  Gastrointestinal: Negative for abdominal pain, bowel incontinence, nausea and vomiting.  Genitourinary: Negative for bladder incontinence, dysuria and pelvic pain.  Musculoskeletal: Positive for back pain.  Neurological: Positive for numbness and headaches. Negative for dizziness, tingling, weakness and paresthesias.    Social History   Tobacco Use  . Smoking status: Never Smoker  . Smokeless tobacco: Never Used  Substance Use Topics  . Alcohol use: No    Alcohol/week: 0.0 standard drinks      Objective:   BP (!) 149/87 (BP Location: Left Arm, Patient Position: Sitting, Cuff Size: Large)  Pulse 93   Temp 98.6 F (37 C) (Oral)   Resp 16   Wt 193 lb (87.5 kg)   SpO2 95%   BMI 35.30 kg/m  Vitals:   07/28/18 1808  BP: (!) 149/87  Pulse: 93  Resp: 16  Temp: 98.6 F (37 C)  TempSrc: Oral  SpO2: 95%  Weight: 193 lb (87.5 kg)     Physical Exam Vitals signs reviewed.  Constitutional:      General: She is not in acute distress.    Appearance: She is well-developed. She is not  diaphoretic.  Neck:     Musculoskeletal: Normal range of motion and neck supple.  Cardiovascular:     Rate and Rhythm: Normal rate and regular rhythm.     Heart sounds: Normal heart sounds. No murmur. No friction rub. No gallop.   Pulmonary:     Effort: Pulmonary effort is normal. No respiratory distress.     Breath sounds: Normal breath sounds. No wheezing or rales.  Musculoskeletal:     Lumbar back: She exhibits decreased range of motion, tenderness, pain and spasm. She exhibits no bony tenderness.     Comments: Positive SLR on the left        Assessment & Plan    1. Acute bilateral low back pain with left-sided sciatica DDx: low back strain from raking with spasm, sciatica, L4-L5 radiculopathy with disc involvement. Toradol injection given today without complication and patient tolerated well. Will give medrol dose pak and flexeril to start tomorrow. Xray ordered as below. I will f/u pending results and advised patient to call if not improving.  - methylPREDNISolone (MEDROL) 4 MG TBPK tablet; 6 day taper; take as directed on package instructions  Dispense: 21 tablet; Refill: 0 - cyclobenzaprine (FLEXERIL) 5 MG tablet; Take 1 tablet (5 mg total) by mouth 3 (three) times daily as needed for muscle spasms.  Dispense: 30 tablet; Refill: 1 - ketorolac (TORADOL) injection 60 mg - DG Lumbar Spine Complete; Future     Margaretann Loveless, PA-C  [facility]

## 2018-07-28 NOTE — Patient Instructions (Signed)
Ketorolac injection What is this medicine? KETOROLAC (kee toe ROLE ak) is a non-steroidal anti-inflammatory drug (NSAID). It is used to treat moderate to severe pain for up to 5 days. It is commonly used after surgery. This medicine should not be used for more than 5 days. This medicine may be used for other purposes; ask your health care provider or pharmacist if you have questions. COMMON BRAND NAME(S): Toradol What should I tell my health care provider before I take this medicine? They need to know if you have any of these conditions: -asthma, especially aspirin-sensitive asthma -bleeding problems -coronary artery bypass graft (CABG) surgery within the past 2 weeks -kidney disease -stomach bleed, ulcer, or other problem -taking aspirin, other NSAID, or probenecid -an unusual or allergic reaction to ketorolac, tromethamine, aspirin, other NSAIDs, other medicines, foods, dyes or preservatives -pregnant or trying to get pregnant -breast-feeding How should I use this medicine? This medicine is for injection into a muscle or into a vein. It is given by a health care professional in a hospital or clinic setting. Talk to your pediatrician regarding the use of this medicine in children. Special care may be needed. Patients over 28 years old may have a stronger reaction and need a smaller dose. Overdosage: If you think you have taken too much of this medicine contact a poison control center or emergency room at once. NOTE: This medicine is only for you. Do not share this medicine with others. What if I miss a dose? This does not apply. What may interact with this medicine? Do not take this medicine with any of the following medications: -aspirin and aspirin-like medicines -cidofovir -methotrexate -NSAIDs, medicines for pain and inflammation, like ibuprofen or naproxen -pentoxifylline -probenecid This medicine may also interact with the following  medications: -alcohol -alendronate -alprazolam -carbamazepine -diuretics -flavocoxid -fluoxetine -ginkgo -lithium -medicines for blood pressure like enalapril -medicines that affect platelets like pentoxifylline -medicines that treat or prevent blood clots like heparin, warfarin -muscle relaxants -pemetrexed -phenytoin -thiothixene This list may not describe all possible interactions. Give your health care provider a list of all the medicines, herbs, non-prescription drugs, or dietary supplements you use. Also tell them if you smoke, drink alcohol, or use illegal drugs. Some items may interact with your medicine. What should I watch for while using this medicine? Tell your doctor or healthcare professional if your symptoms do not start to get better or if they get worse. This medicine does not prevent heart attack or stroke. In fact, this medicine may increase the chance of a heart attack or stroke. The chance may increase with longer use of this medicine and in people who have heart disease. If you take aspirin to prevent heart attack or stroke, talk with your doctor or health care professional. Do not take medicines such as ibuprofen and naproxen with this medicine. Side effects such as stomach upset, nausea, or ulcers may be more likely to occur. Many medicines available without a prescription should not be taken with this medicine. This medicine can cause ulcers and bleeding in the stomach and intestines at any time during treatment. Do not smoke cigarettes or drink alcohol. These increase irritation to your stomach and can make it more susceptible to damage from this medicine. Ulcers and bleeding can happen without warning symptoms and can cause death. This medicine can cause you to bleed more easily. Try to avoid damage to your teeth and gums when you brush or floss your teeth. What side effects may I notice from receiving  this medicine? Side effects that you should report to your  doctor or health care professional as soon as possible: -allergic reactions like skin rash, itching or hives, swelling of the face, lips, or tongue -breathing problems -high blood pressure -nausea, vomiting -redness, blistering, peeling or loosening of the skin, including inside the mouth -severe stomach pain -signs and symptoms of bleeding such as bloody or black, tarry stools; red or dark-brown urine; spitting up blood or brown material that looks like coffee grounds; red spots on the skin; unusual bruising or bleeding from the eye, gums, or nose -signs and symptoms of a blood clot changes in vision; chest pain; severe, sudden headache; trouble speaking; sudden numbness or weakness of the face, arm, or leg -trouble passing urine or change in the amount of urine -unexplained weight gain or swelling -unusually weak or tired -yellowing of eyes or skin Side effects that usually do not require medical attention (report to your doctor or health care professional if they continue or are bothersome): -diarrhea -dizziness -headache -heartburn This list may not describe all possible side effects. Call your doctor for medical advice about side effects. You may report side effects to FDA at 1-800-FDA-1088. Where should I keep my medicine? This drug is given in a hospital or clinic and will not be stored at home. NOTE: This sheet is a summary. It may not cover all possible information. If you have questions about this medicine, talk to your doctor, pharmacist, or health care provider.  2019 Elsevier/Gold Standard (2017-02-23 15:19:19)   Acute Back Pain, Adult Acute back pain is sudden and usually short-lived. It is often caused by an injury to the muscles and tissues in the back. The injury may result from:  A muscle or ligament getting overstretched or torn (strained). Ligaments are tissues that connect bones to each other. Lifting something improperly can cause a back strain.  Wear and tear  (degeneration) of the spinal disks. Spinal disks are circular tissue that provides cushioning between the bones of the spine (vertebrae).  Twisting motions, such as while playing sports or doing yard work.  A hit to the back.  Arthritis. You may have a physical exam, lab tests, and imaging tests to find the cause of your pain. Acute back pain usually goes away with rest and home care. Follow these instructions at home: Managing pain, stiffness, and swelling  Take over-the-counter and prescription medicines only as told by your health care provider.  Your health care provider may recommend applying ice during the first 24-48 hours after your pain starts. To do this: ? Put ice in a plastic bag. ? Place a towel between your skin and the bag. ? Leave the ice on for 20 minutes, 2-3 times a day.  If directed, apply heat to the affected area as often as told by your health care provider. Use the heat source that your health care provider recommends, such as a moist heat pack or a heating pad. ? Place a towel between your skin and the heat source. ? Leave the heat on for 20-30 minutes. ? Remove the heat if your skin turns bright red. This is especially important if you are unable to feel pain, heat, or cold. You have a greater risk of getting burned. Activity   Do not stay in bed. Staying in bed for more than 1-2 days can delay your recovery.  Sit up and stand up straight. Avoid leaning forward when you sit, or hunching over when you stand. ? If  you work at a desk, sit close to it so you do not need to lean over. Keep your chin tucked in. Keep your neck drawn back, and keep your elbows bent at a right angle. Your arms should look like the letter "L." ? Sit high and close to the steering wheel when you drive. Add lower back (lumbar) support to your car seat, if needed.  Take short walks on even surfaces as soon as you are able. Try to increase the length of time you walk each day.  Do not  sit, drive, or stand in one place for more than 30 minutes at a time. Sitting or standing for long periods of time can put stress on your back.  Do not drive or use heavy machinery while taking prescription pain medicine.  Use proper lifting techniques. When you bend and lift, use positions that put less stress on your back: ? RandlettBend your knees. ? Keep the load close to your body. ? Avoid twisting.  Exercise regularly as told by your health care provider. Exercising helps your back heal faster and helps prevent back injuries by keeping muscles strong and flexible.  Work with a physical therapist to make a safe exercise program, as recommended by your health care provider. Do any exercises as told by your physical therapist. Lifestyle  Maintain a healthy weight. Extra weight puts stress on your back and makes it difficult to have good posture.  Avoid activities or situations that make you feel anxious or stressed. Stress and anxiety increase muscle tension and can make back pain worse. Learn ways to manage anxiety and stress, such as through exercise. General instructions  Sleep on a firm mattress in a comfortable position. Try lying on your side with your knees slightly bent. If you lie on your back, put a pillow under your knees.  Follow your treatment plan as told by your health care provider. This may include: ? Cognitive or behavioral therapy. ? Acupuncture or massage therapy. ? Meditation or yoga. Contact a health care provider if:  You have pain that is not relieved with rest or medicine.  You have increasing pain going down into your legs or buttocks.  Your pain does not improve after 2 weeks.  You have pain at night.  You lose weight without trying.  You have a fever or chills. Get help right away if:  You develop new bowel or bladder control problems.  You have unusual weakness or numbness in your arms or legs.  You develop nausea or vomiting.  You develop  abdominal pain.  You feel faint. Summary  Acute back pain is sudden and usually short-lived.  Use proper lifting techniques. When you bend and lift, use positions that put less stress on your back.  Take over-the-counter and prescription medicines and apply heat or ice as directed by your health care provider. This information is not intended to replace advice given to you by your health care provider. Make sure you discuss any questions you have with your health care provider. Document Released: 06/21/2005 Document Revised: 01/26/2018 Document Reviewed: 02/02/2017 Elsevier Interactive Patient Education  2019 [employer]

## 2018-08-01 ENCOUNTER — Ambulatory Visit
Admission: RE | Admit: 2018-08-01 | Discharge: 2018-08-01 | Disposition: A | Discharge status: Home / Self Care | Payer: 59 | Source: Ambulatory Visit | Attending: Physician Assistant | Admitting: Physician Assistant

## 2018-08-01 ENCOUNTER — Ambulatory Visit
Admission: RE | Admit: 2018-08-01 | Discharge: 2018-08-01 | Disposition: A | Discharge status: Home / Self Care | Payer: 59 | Attending: Physician Assistant | Admitting: Physician Assistant

## 2018-08-01 DIAGNOSIS — M5442 Lumbago with sciatica, left side: Secondary | ICD-10-CM | POA: Insufficient documentation

## 2018-08-01 DIAGNOSIS — M545 Low back pain: Secondary | ICD-10-CM | POA: Insufficient documentation

## 2018-08-02 ENCOUNTER — Other Ambulatory Visit: Payer: Self-pay | Admitting: Physician Assistant

## 2018-08-02 DIAGNOSIS — G8929 Other chronic pain: Principal | ICD-10-CM

## 2018-08-02 DIAGNOSIS — M545 Low back pain, unspecified: Secondary | ICD-10-CM

## 2018-08-02 DIAGNOSIS — M47816 Spondylosis without myelopathy or radiculopathy, lumbar region: Secondary | ICD-10-CM

## 2018-08-02 NOTE — Progress Notes (Signed)
Referral to ortho for low back pain, spondylosis L4-L5

## 2018-09-14 ENCOUNTER — Other Ambulatory Visit: Payer: Self-pay | Admitting: Physician Assistant

## 2018-09-14 DIAGNOSIS — E119 Type 2 diabetes mellitus without complications: Secondary | ICD-10-CM

## 2018-09-14 NOTE — Telephone Encounter (Signed)
Patient calling office requesting refill on Metformin to [facility] in [place]. KW

## 2018-10-09 ENCOUNTER — Other Ambulatory Visit: Payer: Self-pay | Admitting: Physician Assistant

## 2018-10-09 DIAGNOSIS — J309 Allergic rhinitis, unspecified: Secondary | ICD-10-CM

## 2018-12-07 ENCOUNTER — Other Ambulatory Visit: Payer: Self-pay | Admitting: Physician Assistant

## 2018-12-07 DIAGNOSIS — E7800 Pure hypercholesterolemia, unspecified: Secondary | ICD-10-CM

## 2018-12-15 ENCOUNTER — Telehealth: Payer: Self-pay

## 2018-12-15 NOTE — Telephone Encounter (Signed)
Patient called to schedule appointment. Appt scheduled.

## 2018-12-21 ENCOUNTER — Telehealth (INDEPENDENT_AMBULATORY_CARE_PROVIDER_SITE_OTHER): Payer: 59 | Admitting: Physician Assistant

## 2018-12-21 ENCOUNTER — Encounter: Payer: Self-pay | Admitting: Physician Assistant

## 2018-12-21 DIAGNOSIS — N939 Abnormal uterine and vaginal bleeding, unspecified: Secondary | ICD-10-CM

## 2018-12-21 NOTE — Patient Instructions (Signed)
Abnormal Uterine Bleeding  Abnormal uterine bleeding means bleeding more than usual from your uterus. It can include:   Bleeding between periods.   Bleeding after sex.   Bleeding that is heavier than normal.   Periods that last longer than usual.   Bleeding after you have stopped having your period (menopause).  There are many problems that may cause this. You should see a doctor for any kind of bleeding that is not normal. Treatment depends on the cause of the bleeding.  Follow these instructions at home:   Watch your condition for any changes.   Do not use tampons, douche, or have sex, if your doctor tells you not to.   Change your pads often.   Get regular well-woman exams. Make sure they include a pelvic exam and cervical cancer screening.   Keep all follow-up visits as told by your doctor. This is important.  Contact a doctor if:   The bleeding lasts more than one week.   You feel dizzy at times.   You feel like you are going to throw up (nauseous).   You throw up.  Get help right away if:   You pass out.   You have to change pads every hour.   You have belly (abdominal) pain.   You have a fever.   You get sweaty.   You get weak.   You passing large blood clots from your vagina.  Summary   Abnormal uterine bleeding means bleeding more than usual from your uterus.   There are many problems that may cause this. You should see a doctor for any kind of bleeding that is not normal.   Treatment depends on the cause of the bleeding.  This information is not intended to replace advice given to you by your health care provider. Make sure you discuss any questions you have with your health care provider.  Document Released: 04/18/2009 Document Revised: 06/15/2016 Document Reviewed: 06/15/2016  Elsevier Interactive Patient Education  2019 Elsevier Inc.

## 2018-12-21 NOTE — Progress Notes (Signed)
Virtual Visit via Video Note  I connected with Lisa Rivers on 12/21/18 at  1:20 PM EDT by a video enabled telemedicine application and verified that I am speaking with the correct person using two identifiers.  Location: Patient: work Provider: [facility]   I discussed the limitations of evaluation and management by telemedicine and the availability of in person appointments. The patient expressed understanding and agreed to proceed.  Mar Daring, PA-C   Acute Office Visit  Subjective:    Patient ID: Lisa Rivers, female    DOB: 1970-12-28, 48 y.o.   MRN: [id number]  No chief complaint on file.   HPI Patient is in today for abnormal uterine bleeding. She reports in April she had a regular menstrual cycle for 6 days, but then spotted for 14 days after (very light not requiring a pad). Then on May 20th she started her next menstrual and it lasted until the 31st. This one was very heavy with some clots. Afterwards she spotted for the next 8-9 days (again not requiring a pad). Then on June 10th-12th she spotted heavy enough to require a pad again. Then June 13th-today it is back to the light spotting not requiring a pad (sees on tissue paper after wiping). No other symptoms. No family or personal history of uterine fibroids, ovarian cysts, or uterine or ovarian cancer.   Past Medical History:  Diagnosis Date  . ACL tear   . Adopted   . Arthritis   . Asthma   . Diabetes mellitus without complication ([place])   . Headache    MIRGRAINES  . Hyperlipidemia     Past Surgical History:  Procedure Laterality Date  . APPENDECTOMY  12/2013  . FOOT SURGERY  [phone]   BOTH FEET  . SHOULDER ARTHROSCOPY WITH BANKART REPAIR Left 11/02/2016   Procedure: SHOULDER ARTHROSCOPY WITH SUPERIOR LABRAL REPAIR , distal clavicle excision, and subacrominal decompression;  Surgeon: Thornton Park, MD;  Location: [facility];  Service: Orthopedics;  Laterality: Left;    Family History   Adopted: Yes  Problem Relation Age of Onset  . Coronary artery disease Mother   . Alcohol abuse Mother   . Heart disease Mother   . Diabetes Father   . Alcohol abuse Father   . Diabetes Brother     Social History   Socioeconomic History  . Marital status: Married    Spouse name: Gerald Stabs  . Number of children: 0  . Years of education: 61  . Highest education level: Not on file  Occupational History  . Occupation: full time-city of Engineering geologist: [employer]  Social Needs  . Financial resource strain: Not on file  . Food insecurity    Worry: Not on file    Inability: Not on file  . Transportation needs    Medical: Not on file    Non-medical: Not on file  Tobacco Use  . Smoking status: Never Smoker  . Smokeless tobacco: Never Used  Substance and Sexual Activity  . Alcohol use: No    Alcohol/week: 0.0 standard drinks  . Drug use: No  . Sexual activity: Not on file  Lifestyle  . Physical activity    Days per week: Not on file    Minutes per session: Not on file  . Stress: Not on file  Relationships  . Social Herbalist on phone: Not on file    Gets together: Not on file    Attends religious service:  Not on file    Active member of club or organization: Not on file    Attends meetings of clubs or organizations: Not on file    Relationship status: Not on file  . Intimate partner violence    Fear of current or ex partner: Not on file    Emotionally abused: Not on file    Physically abused: Not on file    Forced sexual activity: Not on file  Other Topics Concern  . Not on file  Social History Narrative  . Not on file    Outpatient Medications Prior to Visit  Medication Sig Dispense Refill  . albuterol (PROAIR HFA) 108 (90 BASE) MCG/ACT inhaler Inhale 2 puffs into the lungs every 6 (six) hours as needed for wheezing or shortness of breath.     Marland Kitchen. b complex vitamins capsule Take 1 capsule by mouth daily.    . Cholecalciferol (VITAMIN D PO)  Take 2 tablets by mouth daily. 1500 iu per tablet    . cyclobenzaprine (FLEXERIL) 5 MG tablet Take 1 tablet (5 mg total) by mouth 3 (three) times daily as needed for muscle spasms. 30 tablet 1  . fluticasone (FLONASE) 50 MCG/ACT nasal spray SHAKE LIQUID AND USE 2 SPRAYS IN EACH NOSTRIL DAILY AS NEEDED 16 g 5  . hydrocortisone cream 0.5 % Apply topically.    . metFORMIN (GLUCOPHAGE) 1000 MG tablet TAKE 1 TABLET BY MOUTH TWICE DAILY 180 tablet 3  . methylPREDNISolone (MEDROL) 4 MG TBPK tablet 6 day taper; take as directed on package instructions 21 tablet 0  . Multiple Vitamin (MULTIVITAMIN WITH MINERALS) TABS tablet Take 1 tablet by mouth daily.    . simvastatin (ZOCOR) 20 MG tablet TAKE 1 TABLET BY MOUTH AT BEDTIME 90 tablet 1   No facility-administered medications prior to visit.     Allergies  Allergen Reactions  . Tape Other (See Comments)    ADHESIVE  CAUSED BLISTERS    Review of Systems  Constitutional: Negative.   HENT: Negative.   Respiratory: Negative.   Cardiovascular: Negative.   Gastrointestinal: Negative.   Genitourinary:       Abnormal menstrual cycles  Skin: Negative.   Endo/Heme/Allergies: Does not bruise/bleed easily.       Objective:    Physical Exam  Constitutional: She appears well-developed and well-nourished. No distress.  HENT:  Head: Normocephalic and atraumatic.  Eyes: EOM are normal. No scleral icterus.  Neck: Normal range of motion.  Pulmonary/Chest: Effort normal. No respiratory distress.  Psychiatric: She has a normal mood and affect. Her behavior is normal. Judgment and thought content normal.  Vitals reviewed.   There were no vitals taken for this visit. Wt Readings from Last 3 Encounters:  07/28/18 193 lb (87.5 kg)  09/08/17 191 lb (86.6 kg)  07/13/17 184 lb (83.5 kg)    Health Maintenance Due  Topic Date Due  . PNEUMOCOCCAL POLYSACCHARIDE VACCINE AGE 26-64 HIGH RISK  05/23/1971  . HIV Screening  05/22/1984  . URINE MICROALBUMIN   01/03/2016  . FOOT EXAM  07/08/2016  . HEMOGLOBIN A1C  11/29/2017  . PAP SMEAR-Modifier  01/02/2018  . OPHTHALMOLOGY EXAM  06/03/2018    There are no preventive care reminders to display for this patient.   Lab Results  Component Value Date   TSH 1.300 06/01/2017   Lab Results  Component Value Date   WBC 7.4 06/01/2017   HGB 13.2 06/01/2017   HCT 38.8 06/01/2017   MCV 87 06/01/2017   PLT 378  06/01/2017   Lab Results  Component Value Date   NA 143 06/01/2017   K 4.5 06/01/2017   CO2 24 06/01/2017   GLUCOSE 134 (H) 06/01/2017   BUN 11 06/01/2017   CREATININE 0.79 06/01/2017   BILITOT 0.5 06/01/2017   ALKPHOS 55 06/01/2017   AST 18 06/01/2017   ALT 18 06/01/2017   PROT 6.7 06/01/2017   ALBUMIN 4.1 06/01/2017   CALCIUM 9.2 06/01/2017   ANIONGAP 7 10/27/2016   Lab Results  Component Value Date   CHOL 147 06/01/2017   Lab Results  Component Value Date   HDL 47 06/01/2017   Lab Results  Component Value Date   LDLCALC 67 06/01/2017   Lab Results  Component Value Date   TRIG 167 (H) 06/01/2017   Lab Results  Component Value Date   CHOLHDL 3.0 02/12/2016   Lab Results  Component Value Date   HGBA1C 6.3 (H) 06/01/2017       Assessment & Plan:   1. Abnormal uterine bleeding Suspect perimenopausal. Could possibly be stressed induced (patient has had 4 ESIs in the back, scheduled for surgery in middle of July). Will get US as below to r/o other source of abnormal bleeding. I will f/u pending results. Call if worsening.  - US Pelvic Complete With Transvaginal; Future  I discussed the assessment and treatment plan with the patient. The patient was provided an opportunity to ask questions and all were answered. The patient agreed with the plan and demonstrated an understanding of the instructions.   The patient was advised to call back or seek an in-person evaluation if the symptoms worsen or if the condition fails to improve as anticipated.  I provided 20  minutes of non-face-to-face time during this encounter.    Margaretann LovelessJennifer M Roma Bierlein, PA-C

## 2018-12-26 ENCOUNTER — Ambulatory Visit: Payer: 59

## 2019-01-01 ENCOUNTER — Other Ambulatory Visit: Payer: 59

## 2019-01-18 ENCOUNTER — Ambulatory Visit: Payer: 59

## 2019-02-03 HISTORY — PX: BACK SURGERY: SHX140

## 2019-04-11 ENCOUNTER — Other Ambulatory Visit: Payer: Self-pay | Admitting: Physician Assistant

## 2019-04-11 DIAGNOSIS — J309 Allergic rhinitis, unspecified: Secondary | ICD-10-CM

## 2019-05-11 ENCOUNTER — Ambulatory Visit (INDEPENDENT_AMBULATORY_CARE_PROVIDER_SITE_OTHER): Payer: 59 | Admitting: Physician Assistant

## 2019-05-11 DIAGNOSIS — J019 Acute sinusitis, unspecified: Secondary | ICD-10-CM

## 2019-05-11 MED ORDER — AMOXICILLIN-POT CLAVULANATE 875-125 MG PO TABS: 1.0000 | ORAL_TABLET | Freq: Two times a day (BID) | ORAL | 0 refills | Status: AC

## 2019-05-11 NOTE — Patient Instructions (Signed)

## 2019-05-11 NOTE — Progress Notes (Signed)
Patient: Lisa Rivers Female    DOB: [birth year]   48 y.o.   MRN: [id number] Visit Date: 05/11/2019  Today's Provider: Trinna Post, PA-C   Chief Complaint  Patient presents with  . Cough   Subjective:    Virtual Visit via Telephone Note  I connected with Lisa Rivers on 05/11/19 at 11:20 AM EST by telephone and verified that I am speaking with the correct person using two identifiers.  Location: Patient: Home  Provider: Office   I discussed the limitations, risks, security and privacy concerns of performing an evaluation and management service by telephone and the availability of in person appointments. I also discussed with the patient that there may be a patient responsible charge related to this service. The patient expressed understanding and agreed to proceed.  Patient with history of type II diabetes reports a sinus infection. She reports she has had typical symptoms of allergies and was using flonase, claritin, and mucinex which has worsened over the past three weeks. No fevers, no shortness of breath. Denies COVID contacts. Works in an Transport planner and wears a mask. No myalgias, nausea, vomiting. Reports ear pain and pressure. Reports pain and pressure in her sinuses to palpation.   Cough The current episode started 1 to 4 weeks ago. The problem has been unchanged. The cough is productive of sputum. Associated symptoms include ear pain, headaches, nasal congestion, postnasal drip and a sore throat. Pertinent negatives include no chills, fever, shortness of breath or wheezing. Her past medical history is significant for asthma and environmental allergies.    Allergies  Allergen Reactions  . Tape Other (See Comments)    ADHESIVE  CAUSED BLISTERS     Current Outpatient Medications:  .  albuterol (PROAIR HFA) 108 (90 BASE) MCG/ACT inhaler, Inhale 2 puffs into the lungs every 6 (six) hours as needed for wheezing or shortness of breath. , Disp: , Rfl:    .  b complex vitamins capsule, Take 1 capsule by mouth daily., Disp: , Rfl:  .  Cholecalciferol (VITAMIN D PO), Take 2 tablets by mouth daily. 1500 iu per tablet, Disp: , Rfl:  .  cyclobenzaprine (FLEXERIL) 5 MG tablet, Take 1 tablet (5 mg total) by mouth 3 (three) times daily as needed for muscle spasms., Disp: 30 tablet, Rfl: 1 .  fluticasone (FLONASE) 50 MCG/ACT nasal spray, SHAKE LIQUID AND USE 2 SPRAYS IN EACH NOSTRIL DAILY AS NEEDED, Disp: 16 g, Rfl: 5 .  hydrocortisone cream 0.5 %, Apply topically., Disp: , Rfl:  .  metFORMIN (GLUCOPHAGE) 1000 MG tablet, TAKE 1 TABLET BY MOUTH TWICE DAILY, Disp: 180 tablet, Rfl: 3 .  Multiple Vitamin (MULTIVITAMIN WITH MINERALS) TABS tablet, Take 1 tablet by mouth daily., Disp: , Rfl:  .  simvastatin (ZOCOR) 20 MG tablet, TAKE 1 TABLET BY MOUTH AT BEDTIME, Disp: 90 tablet, Rfl: 1 .  methylPREDNISolone (MEDROL) 4 MG TBPK tablet, 6 day taper; take as directed on package instructions, Disp: 21 tablet, Rfl: 0  Review of Systems  Constitutional: Negative.  Negative for chills, fatigue and fever.  HENT: Positive for congestion, ear pain, postnasal drip and sore throat.   Respiratory: Positive for cough. Negative for chest tightness, shortness of breath and wheezing.   Allergic/Immunologic: Positive for environmental allergies.  Neurological: Positive for headaches.    Social History   Tobacco Use  . Smoking status: Never Smoker  . Smokeless tobacco: Never Used  Substance Use Topics  . Alcohol use:  No    Alcohol/week: 0.0 standard drinks      Objective:   There were no vitals taken for this visit. There were no vitals filed for this visit.There is no height or weight on file to calculate BMI.   Physical Exam   No results found for any visits on 05/11/19.     Assessment & Plan    1. Acute non-recurrent sinusitis, unspecified location  - amoxicillin-clavulanate (AUGMENTIN) 875-125 MG tablet; Take 1 tablet by mouth 2 (two) times daily for 7  days.  Dispense: 14 tablet; Refill: 0   I discussed the assessment and treatment plan with the patient. The patient was provided an opportunity to ask questions and all were answered. The patient agreed with the plan and demonstrated an understanding of the instructions.   The patient was advised to call back or seek an in-person evaluation if the symptoms worsen or if the condition fails to improve as anticipated.  The entirety of the information documented in the History of Present Illness, Review of Systems and Physical Exam were personally obtained by me. Portions of this information were initially documented by [facility], CMA and reviewed by me for thoroughness and accuracy.         Trey Sailors, PA-C  [facility]

## 2019-06-07 ENCOUNTER — Other Ambulatory Visit: Payer: Self-pay | Admitting: Physician Assistant

## 2019-06-07 DIAGNOSIS — E7800 Pure hypercholesterolemia, unspecified: Secondary | ICD-10-CM

## 2019-06-26 ENCOUNTER — Encounter: Payer: Self-pay | Admitting: Physician Assistant

## 2019-06-26 ENCOUNTER — Telehealth: Payer: Self-pay

## 2019-06-26 DIAGNOSIS — Z0000 Encounter for general adult medical examination without abnormal findings: Secondary | ICD-10-CM

## 2019-06-26 DIAGNOSIS — E119 Type 2 diabetes mellitus without complications: Secondary | ICD-10-CM

## 2019-06-26 DIAGNOSIS — E7800 Pure hypercholesterolemia, unspecified: Secondary | ICD-10-CM

## 2019-06-26 DIAGNOSIS — E559 Vitamin D deficiency, unspecified: Secondary | ICD-10-CM

## 2019-06-26 NOTE — Telephone Encounter (Signed)
Copied from [email or web address]. Topic: Appointment Scheduling - Scheduling Inquiry for Clinic >> Jun 26, 2019 12:40 PM Erick Blinks wrote: Reason for CRM: Requesting labs for diabetes and cholesterol and whatever PCP needs. Please advise

## 2019-06-26 NOTE — Addendum Note (Signed)
Addended by: Mar Daring on: 06/26/2019 03:42 PM   Modules accepted: Orders

## 2019-06-26 NOTE — Telephone Encounter (Signed)
Needs CPE appt please 

## 2019-06-26 NOTE — Telephone Encounter (Signed)
The patient has a physical scheduled for January but is needing to get her labs drawn on her day off this Monday if you can put in the orders for her.

## 2019-06-26 NOTE — Telephone Encounter (Signed)
Labs ordered.

## 2019-06-26 NOTE — Telephone Encounter (Signed)
Is it okay to order labs without an office visit?

## 2019-07-05 LAB — CBC WITH DIFFERENTIAL/PLATELET
Basophils Absolute: 0.1 10*3/uL (ref 0.0–0.2)
Basos: 1 %
EOS (ABSOLUTE): 0.3 10*3/uL (ref 0.0–0.4)
Eos: 3 %
Hematocrit: 40.2 % (ref 34.0–46.6)
Hemoglobin: 13.4 g/dL (ref 11.1–15.9)
Immature Grans (Abs): 0 10*3/uL (ref 0.0–0.1)
Immature Granulocytes: 0 %
Lymphocytes Absolute: 2.3 10*3/uL (ref 0.7–3.1)
Lymphs: 23 %
MCH: 28.8 pg (ref 26.6–33.0)
MCHC: 33.3 g/dL (ref 31.5–35.7)
MCV: 87 fL (ref 79–97)
Monocytes Absolute: 0.6 10*3/uL (ref 0.1–0.9)
Monocytes: 6 %
Neutrophils Absolute: 6.6 10*3/uL (ref 1.4–7.0)
Neutrophils: 67 %
Platelets: 394 10*3/uL (ref 150–450)
RBC: 4.65 x10E6/uL (ref 3.77–5.28)
RDW: 12.5 % (ref 11.7–15.4)
WBC: 9.8 10*3/uL (ref 3.4–10.8)

## 2019-07-05 LAB — LIPID PANEL
Chol/HDL Ratio: 3.1 ratio (ref 0.0–4.4)
Cholesterol, Total: 133 mg/dL (ref 100–199)
HDL: 43 mg/dL (ref >39)
LDL Chol Calc (NIH): 42 mg/dL (ref 0–99)
Triglycerides: 317 mg/dL — ABNORMAL HIGH (ref 0–149)
VLDL Cholesterol Cal: 48 mg/dL — ABNORMAL HIGH (ref 5–40)

## 2019-07-05 LAB — COMPREHENSIVE METABOLIC PANEL
ALT: 32 IU/L (ref 0–32)
AST: 24 IU/L (ref 0–40)
Albumin/Globulin Ratio: 1.8 (ref 1.2–2.2)
Albumin: 4.2 g/dL (ref 3.8–4.8)
Alkaline Phosphatase: 68 IU/L (ref 39–117)
BUN/Creatinine Ratio: 12 (ref 9–23)
BUN: 10 mg/dL (ref 6–24)
Bilirubin Total: 0.4 mg/dL (ref 0.0–1.2)
CO2: 24 mmol/L (ref 20–29)
Calcium: 9.3 mg/dL (ref 8.7–10.2)
Chloride: 98 mmol/L (ref 96–106)
Creatinine, Ser: 0.85 mg/dL (ref 0.57–1.00)
GFR calc Af Amer: 92 mL/min/{1.73_m2} (ref >59)
GFR calc non Af Amer: 80 mL/min/{1.73_m2} (ref >59)
Globulin, Total: 2.4 g/dL (ref 1.5–4.5)
Glucose: 190 mg/dL — ABNORMAL HIGH (ref 65–99)
Potassium: 3.9 mmol/L (ref 3.5–5.2)
Sodium: 137 mmol/L (ref 134–144)
Total Protein: 6.6 g/dL (ref 6.0–8.5)

## 2019-07-05 LAB — HEMOGLOBIN A1C
Est. average glucose Bld gHb Est-mCnc: 171 mg/dL
Hgb A1c MFr Bld: 7.6 % — ABNORMAL HIGH (ref 4.8–5.6)

## 2019-07-05 LAB — TSH: TSH: 2.57 u[IU]/mL (ref 0.450–4.500)

## 2019-07-05 LAB — VITAMIN D 25 HYDROXY (VIT D DEFICIENCY, FRACTURES): Vit D, 25-Hydroxy: 30.7 ng/mL (ref 30.0–100.0)

## 2019-07-10 ENCOUNTER — Telehealth: Payer: Self-pay

## 2019-07-10 NOTE — Telephone Encounter (Signed)
-----   Message from Margaretann Loveless, [place] sent at 07/09/2019  1:16 PM EST ----- Blood count is normal. Kidney and liver function are normal. Sodium, potassium and calcium are normal. Thyroid is normal. A1c is 7.6. This is a big increase from 6.3 two years ago. Make sure to be taking metformin 1000mg  BID.  If taking as prescribed may need to add a second sugar lowering agent. Cholesterol is normal. Triglycerides were elevated but that is because this was a non fasting lab. Vit D is normal, but low normal. May benefit from OTC Vit D 1000 IU daily.

## 2019-07-10 NOTE — Telephone Encounter (Signed)
Written by Margaretann Loveless, PA-C on 07/09/2019 1:16 PM EST Seen by patient Andee Poles on 07/09/2019 6:16 PM EST

## 2019-07-26 ENCOUNTER — Encounter: Payer: Self-pay | Admitting: Physician Assistant

## 2019-08-30 ENCOUNTER — Other Ambulatory Visit: Payer: Self-pay

## 2019-08-30 ENCOUNTER — Encounter: Payer: Self-pay | Admitting: Physician Assistant

## 2019-08-30 ENCOUNTER — Ambulatory Visit (INDEPENDENT_AMBULATORY_CARE_PROVIDER_SITE_OTHER): Payer: 59 | Admitting: Physician Assistant

## 2019-08-30 ENCOUNTER — Other Ambulatory Visit (HOSPITAL_COMMUNITY)
Admission: RE | Admit: 2019-08-30 | Discharge: 2019-08-30 | Disposition: A | Discharge status: Home / Self Care | Payer: 59 | Source: Ambulatory Visit | Attending: Physician Assistant | Admitting: Physician Assistant

## 2019-08-30 VITALS — BP 153/87 | HR 105 | Temp 98.2°F | Ht 62.0 in | Wt 192.4 lb

## 2019-08-30 DIAGNOSIS — Z1239 Encounter for other screening for malignant neoplasm of breast: Secondary | ICD-10-CM

## 2019-08-30 DIAGNOSIS — E119 Type 2 diabetes mellitus without complications: Secondary | ICD-10-CM | POA: Diagnosis not present

## 2019-08-30 DIAGNOSIS — Z0000 Encounter for general adult medical examination without abnormal findings: Secondary | ICD-10-CM

## 2019-08-30 DIAGNOSIS — Z124 Encounter for screening for malignant neoplasm of cervix: Secondary | ICD-10-CM | POA: Diagnosis not present

## 2019-08-30 DIAGNOSIS — Z1211 Encounter for screening for malignant neoplasm of colon: Secondary | ICD-10-CM

## 2019-08-30 DIAGNOSIS — Z6835 Body mass index (BMI) 35.0-35.9, adult: Secondary | ICD-10-CM

## 2019-08-30 LAB — POCT UA - MICROALBUMIN: Microalbumin Ur, POC: 50 mg/L

## 2019-08-30 NOTE — Progress Notes (Signed)
Patient: Lisa Rivers, Female    DOB: 1971-06-09, 49 y.o.   MRN: [id number] Visit Date: 08/30/2019  Today's Provider: Margaretann Loveless, PA-C   Chief Complaint  Patient presents with  . Annual Exam   Subjective:     Annual physical exam Lisa Rivers is a 49 y.o. female who presents today for health maintenance and complete physical. She feels well. She reports exercising walking. She reports she is sleeping fairly well. -----------------------------------------------------------------   Review of Systems  Constitutional: Negative.   HENT: Negative.   Eyes: Negative.   Respiratory: Negative.   Cardiovascular: Negative.   Gastrointestinal: Negative.   Endocrine: Negative.   Genitourinary: Negative.   Musculoskeletal: Negative.   Skin: Negative.   Allergic/Immunologic: Positive for environmental allergies.  Neurological: Negative.   Hematological: Negative.   Psychiatric/Behavioral: Negative.     Social History      She  reports that she has never smoked. She has never used smokeless tobacco. She reports that she does not drink alcohol or use drugs.       Social History   Socioeconomic History  . Marital status: Married    Spouse name: Thayer Ohm  . Number of children: 0  . Years of education: 49  . Highest education level: Not on file  Occupational History  . Occupation: full time-city of Product/process development scientist: [employer]  Tobacco Use  . Smoking status: Never Smoker  . Smokeless tobacco: Never Used  Substance and Sexual Activity  . Alcohol use: No    Alcohol/week: 0.0 standard drinks  . Drug use: No  . Sexual activity: Not on file  Other Topics Concern  . Not on file  Social History Narrative  . Not on file   Social Determinants of Health   Financial Resource Strain:   . Difficulty of Paying Living Expenses: Not on file  Food Insecurity:   . Worried About Programme researcher, broadcasting/film/video in the Last Year: Not on file  . Ran Out of Food in  the Last Year: Not on file  Transportation Needs:   . Lack of Transportation (Medical): Not on file  . Lack of Transportation (Non-Medical): Not on file  Physical Activity:   . Days of Exercise per Week: Not on file  . Minutes of Exercise per Session: Not on file  Stress:   . Feeling of Stress : Not on file  Social Connections:   . Frequency of Communication with Friends and Family: Not on file  . Frequency of Social Gatherings with Friends and Family: Not on file  . Attends Religious Services: Not on file  . Active Member of Clubs or Organizations: Not on file  . Attends Banker Meetings: Not on file  . Marital Status: Not on file    Past Medical History:  Diagnosis Date  . ACL tear   . Adopted   . Arthritis   . Asthma   . Diabetes mellitus without complication (HCC)   . Headache    MIRGRAINES  . Hyperlipidemia      Patient Active Problem List   Diagnosis Date Noted  . Knee pain 05/18/2017  . Patellofemoral stress syndrome 05/18/2017  . Right rotator cuff tendinitis 04/14/2016  . Allergic rhinitis 02/06/2015  . Abortion, spontaneous 12/30/2014  . Carpal tunnel syndrome 12/30/2014  . Diabetes (HCC) 12/30/2014  . Hypercholesterolemia without hypertriglyceridemia 12/30/2014  . Avitaminosis D 12/30/2014    Past Surgical History:  Procedure Laterality Date  .  APPENDECTOMY  12/2013  . BACK SURGERY  02/2019  . FOOT SURGERY  [phone]   BOTH FEET  . SHOULDER ARTHROSCOPY WITH BANKART REPAIR Left 11/02/2016   Procedure: SHOULDER ARTHROSCOPY WITH SUPERIOR LABRAL REPAIR , distal clavicle excision, and subacrominal decompression;  Surgeon: Juanell Fairly, MD;  Location: [facility] ORS;  Service: Orthopedics;  Laterality: Left;    Family History        Family Status  Relation Name Status  . Mother  Deceased at age 59  . Father  Deceased at age 31  . Brother  Alive  . Sister  Alive  . Brother  Deceased at age 26       shot himself  . Brother  Deceased        shot himself  . Brother  Deceased       complications from Hepatitis C  . Brother  Alive        Her family history includes Alcohol abuse in her father and mother; Coronary artery disease in her mother; Diabetes in her brother and father; Heart disease in her mother. She was adopted.      Allergies  Allergen Reactions  . Tape Other (See Comments)    ADHESIVE  CAUSED BLISTERS     Current Outpatient Medications:  .  albuterol (PROAIR HFA) 108 (90 BASE) MCG/ACT inhaler, Inhale 2 puffs into the lungs every 6 (six) hours as needed for wheezing or shortness of breath. , Disp: , Rfl:  .  b complex vitamins capsule, Take 1 capsule by mouth daily., Disp: , Rfl:  .  Cholecalciferol (VITAMIN D PO), Take 2 tablets by mouth daily. 1500 iu per tablet, Disp: , Rfl:  .  fluticasone (FLONASE) 50 MCG/ACT nasal spray, SHAKE LIQUID AND USE 2 SPRAYS IN EACH NOSTRIL DAILY AS NEEDED, Disp: 16 g, Rfl: 5 .  hydrocortisone cream 0.5 %, Apply topically., Disp: , Rfl:  .  metFORMIN (GLUCOPHAGE) 1000 MG tablet, TAKE 1 TABLET BY MOUTH TWICE DAILY, Disp: 180 tablet, Rfl: 3 .  Multiple Vitamin (MULTIVITAMIN WITH MINERALS) TABS tablet, Take 1 tablet by mouth daily., Disp: , Rfl:  .  simvastatin (ZOCOR) 20 MG tablet, TAKE 1 TABLET BY MOUTH AT BEDTIME, Disp: 90 tablet, Rfl: 0 .  cyclobenzaprine (FLEXERIL) 5 MG tablet, Take 1 tablet (5 mg total) by mouth 3 (three) times daily as needed for muscle spasms. (Patient not taking: Reported on 08/30/2019), Disp: 30 tablet, Rfl: 1   Patient Care Team: Reine Just as PCP - General (Physician Assistant)    Objective:    Vitals: BP (!) 153/87 (BP Location: Left Arm, Patient Position: Sitting, Cuff Size: Large)   Pulse (!) 105   Temp 98.2 F (36.8 C) (Temporal)   Ht 5\' 2"  (1.575 m)   Wt 192 lb 6.4 oz (87.3 kg)   SpO2 99%   BMI 35.19 kg/m    Vitals:   08/30/19 1405  BP: (!) 153/87  Pulse: (!) 105  Temp: 98.2 F (36.8 C)  TempSrc: Temporal  SpO2: 99%   Weight: 192 lb 6.4 oz (87.3 kg)  Height: 5\' 2"  (1.575 m)     Physical Exam Vitals reviewed.  Constitutional:      General: She is not in acute distress.    Appearance: Normal appearance. She is well-developed. She is obese. She is not ill-appearing or diaphoretic.  HENT:     Head: Normocephalic and atraumatic.     Right Ear: Hearing, tympanic membrane, ear canal and external ear  normal.     Left Ear: Hearing, tympanic membrane, ear canal and external ear normal.     Mouth/Throat:     Pharynx: Uvula midline.  Eyes:     General: No scleral icterus.       Right eye: No discharge.        Left eye: No discharge.     Extraocular Movements: Extraocular movements intact.     Conjunctiva/sclera: Conjunctivae normal.     Pupils: Pupils are equal, round, and reactive to light.  Neck:     Thyroid: No thyromegaly.     Vascular: No carotid bruit or JVD.     Trachea: No tracheal deviation.  Cardiovascular:     Rate and Rhythm: Normal rate and regular rhythm.     Pulses: Normal pulses.     Heart sounds: Normal heart sounds. No murmur. No friction rub. No gallop.   Pulmonary:     Effort: Pulmonary effort is normal. No respiratory distress.     Breath sounds: Normal breath sounds. No wheezing or rales.  Chest:     Chest wall: No tenderness.     Breasts: Breasts are symmetrical.        Right: No inverted nipple, mass, nipple discharge, skin change or tenderness.        Left: No inverted nipple, mass, nipple discharge, skin change or tenderness.  Abdominal:     General: Abdomen is flat. Bowel sounds are normal. There is no distension.     Palpations: Abdomen is soft. There is no mass.     Tenderness: There is no abdominal tenderness. There is no guarding or rebound.     Hernia: There is no hernia in the left inguinal area.  Genitourinary:    General: Normal vulva.     Exam position: Supine.     Labia:        Right: No rash, tenderness, lesion or injury.        Left: No rash, tenderness,  lesion or injury.      Vagina: Normal. No signs of injury. No vaginal discharge, erythema, tenderness or bleeding.     Cervix: No cervical motion tenderness, discharge or friability.     Adnexa:        Right: No mass, tenderness or fullness.         Left: No mass, tenderness or fullness.       Rectum: Normal.  Musculoskeletal:        General: No tenderness. Normal range of motion.     Cervical back: Normal range of motion and neck supple.     Right lower leg: No edema.     Left lower leg: No edema.  Lymphadenopathy:     Cervical: No cervical adenopathy.  Skin:    General: Skin is warm and dry.     Capillary Refill: Capillary refill takes less than 2 seconds.     Findings: No rash.  Neurological:     General: No focal deficit present.     Mental Status: She is alert and oriented to person, place, and time. Mental status is at baseline.     Cranial Nerves: No cranial nerve deficit.     Coordination: Coordination normal.     Deep Tendon Reflexes: Reflexes are normal and symmetric.  Psychiatric:        Mood and Affect: Mood normal.        Behavior: Behavior normal.        Thought Content: Thought content normal.  Judgment: Judgment normal.    Diabetic Foot Exam - Simple   Simple Foot Form Diabetic Foot exam was performed with the following findings: Yes 08/30/2019  3:06 PM  Visual Inspection No deformities, no ulcerations, no other skin breakdown bilaterally: Yes Sensation Testing Intact to touch and monofilament testing bilaterally: Yes Pulse Check Posterior Tibialis and Dorsalis pulse intact bilaterally: Yes Comments      Depression Screen PHQ 2/9 Scores 08/30/2019 05/18/2017  PHQ - 2 Score 0 0  PHQ- 9 Score 0 2       Assessment & Plan:     Routine Health Maintenance and Physical Exam  Exercise Activities and Dietary recommendations Goals   None     Immunization History  Administered Date(s) Administered  . Tdap 08/20/2010    Health Maintenance   Topic Date Due  . PNEUMOCOCCAL POLYSACCHARIDE VACCINE AGE 59-64 HIGH RISK  05/23/1971  . HIV Screening  05/22/1984  . URINE MICROALBUMIN  01/03/2016  . PAP SMEAR-Modifier  01/02/2018  . OPHTHALMOLOGY EXAM  06/03/2018  . COLONOSCOPY  05/23/2019  . MAMMOGRAM  06/17/2019  . INFLUENZA VACCINE  10/03/2019 (Originally 02/03/2019)  . HEMOGLOBIN A1C  01/02/2020  . TETANUS/TDAP  08/20/2020  . FOOT EXAM  08/29/2020     Discussed health benefits of physical activity, and encouraged her to engage in regular exercise appropriate for her age and condition.    1. Annual physical exam Normal physical exam today. Return in one year. She is to call the office in the meantime if she has any acute issue, questions or concerns.  2. Encounter for breast cancer screening using non-mammogram modality Breast exam today was normal. There is no family history of breast cancer. She does perform regular self breast exams. Mammogram was ordered as below. Information for [facility] clinic was given to patient so she may schedule her mammogram at her convenience. - MM 3D SCREEN BREAST BILATERAL; Future  3. Cervical cancer screening Pap collected today. Will send as below and f/u pending results. - Cytology - PAP  4. Colon cancer screening Declines colonoscopy but agreeable to cologuard and f/u if positive. Cologuard ordered as below.  - Cologuard  5. Type 2 diabetes mellitus without complication, unspecified whether long term insulin use (HCC) Microalbumin normal. Last A1c in December 2020 was elevated at 7.6.  Continue Metformin 1000mg  BID. Reports big jump due to multiple steroid injections she has required for arthralgias given by Orthopedics.  - POCT UA - Microalbumin  6. Class 2 severe obesity due to excess calories with serious comorbidity and body mass index (BMI) of 35.0 to 35.9 in adult [facility]) Counseled patient on healthy lifestyle modifications including dieting and exercise.    --------------------------------------------------------------------    [facility], PA-C  [facility]

## 2019-08-30 NOTE — Patient Instructions (Signed)
[facility] [address] [place],  [place]  [address] Get Driving Directions Main: [phone]   Health Maintenance, Female Adopting a healthy lifestyle and getting preventive care are important in promoting health and wellness. Ask your health care provider about:  The right schedule for you to have regular tests and exams.  Things you can do on your own to prevent diseases and keep yourself healthy. What should I know about diet, weight, and exercise? Eat a healthy diet   Eat a diet that includes plenty of vegetables, fruits, low-fat dairy products, and lean protein.  Do not eat a lot of foods that are high in solid fats, added sugars, or sodium. Maintain a healthy weight Body mass index (BMI) is used to identify weight problems. It estimates body fat based on height and weight. Your health care provider can help determine your BMI and help you achieve or maintain a healthy weight. Get regular exercise Get regular exercise. This is one of the most important things you can do for your health. Most adults should:  Exercise for at least 150 minutes each week. The exercise should increase your heart rate and make you sweat (moderate-intensity exercise).  Do strengthening exercises at least twice a week. This is in addition to the moderate-intensity exercise.  Spend less time sitting. Even light physical activity can be beneficial. Watch cholesterol and blood lipids Have your blood tested for lipids and cholesterol at 51 years of age, then have this test every 5 years. Have your cholesterol levels checked more often if:  Your lipid or cholesterol levels are high.  You are older than 51 years of age.  You are at high risk for heart disease. What should I know about cancer screening? Depending on your health history and family history, you may need to have cancer screening at various ages. This may include screening for:  Breast  cancer.  Cervical cancer.  Colorectal cancer.  Skin cancer.  Lung cancer. What should I know about heart disease, diabetes, and high blood pressure? Blood pressure and heart disease  High blood pressure causes heart disease and increases the risk of stroke. This is more likely to develop in people who have high blood pressure readings, are of African descent, or are overweight.  Have your blood pressure checked: ? Every 3-5 years if you are 18-39 years of age. ? Every year if you are 40 years old or older. Diabetes Have regular diabetes screenings. This checks your fasting blood sugar level. Have the screening done:  Once every three years after age 40 if you are at a normal weight and have a low risk for diabetes.  More often and at a younger age if you are overweight or have a high risk for diabetes. What should I know about preventing infection? Hepatitis B If you have a higher risk for hepatitis B, you should be screened for this virus. Talk with your health care provider to find out if you are at risk for hepatitis B infection. Hepatitis C Testing is recommended for:  Everyone born from 1945 through 1965.  Anyone with known risk factors for hepatitis C. Sexually transmitted infections (STIs)  Get screened for STIs, including gonorrhea and chlamydia, if: ? You are sexually active and are younger than 51 years of age. ? You are older than 51 years of age and your health care provider tells you that you are at risk for this type of infection. ? Your sexual activity   has changed since you were last screened, and you are at increased risk for chlamydia or gonorrhea. Ask your health care provider if you are at risk.  Ask your health care provider about whether you are at high risk for HIV. Your health care provider may recommend a prescription medicine to help prevent HIV infection. If you choose to take medicine to prevent HIV, you should first get tested for HIV. You should  then be tested every 3 months for as long as you are taking the medicine. Pregnancy  If you are about to stop having your period (premenopausal) and you may become pregnant, seek counseling before you get pregnant.  Take 400 to 800 micrograms (mcg) of folic acid every day if you become pregnant.  Ask for birth control (contraception) if you want to prevent pregnancy. Osteoporosis and menopause Osteoporosis is a disease in which the bones lose minerals and strength with aging. This can result in bone fractures. If you are 65 years old or older, or if you are at risk for osteoporosis and fractures, ask your health care provider if you should:  Be screened for bone loss.  Take a calcium or vitamin D supplement to lower your risk of fractures.  Be given hormone replacement therapy (HRT) to treat symptoms of menopause. Follow these instructions at home: Lifestyle  Do not use any products that contain nicotine or tobacco, such as cigarettes, e-cigarettes, and chewing tobacco. If you need help quitting, ask your health care provider.  Do not use street drugs.  Do not share needles.  Ask your health care provider for help if you need support or information about quitting drugs. Alcohol use  Do not drink alcohol if: ? Your health care provider tells you not to drink. ? You are pregnant, may be pregnant, or are planning to become pregnant.  If you drink alcohol: ? Limit how much you use to 0-1 drink a day. ? Limit intake if you are breastfeeding.  Be aware of how much alcohol is in your drink. In the [place] one drink equals one 12 oz bottle of beer (355 mL), one 5 oz glass of wine (148 mL), or one 1 oz glass of hard liquor (44 mL). General instructions  Schedule regular health, dental, and eye exams.  Stay current with your vaccines.  Tell your health care provider if: ? You often feel depressed. ? You have ever been abused or do not feel safe at home. Summary  Adopting a  healthy lifestyle and getting preventive care are important in promoting health and wellness.  Follow your health care provider's instructions about healthy diet, exercising, and getting tested or screened for diseases.  Follow your health care provider's instructions on monitoring your cholesterol and blood pressure. This information is not intended to replace advice given to you by your health care provider. Make sure you discuss any questions you have with your health care provider. Document Revised: 06/14/2018 Document Reviewed: 06/14/2018 Elsevier Patient Education  2020 Elsevier Inc.  

## 2019-09-04 LAB — CYTOLOGY - PAP
Comment: NEGATIVE
Diagnosis: NEGATIVE
High risk HPV: NEGATIVE

## 2019-09-09 ENCOUNTER — Other Ambulatory Visit: Payer: Self-pay | Admitting: Physician Assistant

## 2019-09-09 DIAGNOSIS — E119 Type 2 diabetes mellitus without complications: Secondary | ICD-10-CM

## 2019-09-09 DIAGNOSIS — E7800 Pure hypercholesterolemia, unspecified: Secondary | ICD-10-CM

## 2019-09-09 NOTE — Telephone Encounter (Signed)
Requested Prescriptions  Pending Prescriptions Disp Refills  . simvastatin (ZOCOR) 20 MG tablet [Pharmacy Med Name: SIMVASTATIN 20MG TABLETS] 90 tablet 0    Sig: TAKE 1 TABLET BY MOUTH AT BEDTIME     Cardiovascular:  Antilipid - Statins Failed - 09/09/2019  7:01 AM      Failed - LDL in normal range and within 360 days    LDL Chol Calc (NIH)  Date Value Ref Range Status  07/04/2019 42 0 - 99 mg/dL Final         Failed - Triglycerides in normal range and within 360 days    Triglycerides  Date Value Ref Range Status  07/04/2019 317 (H) 0 - 149 mg/dL Final         Passed - Total Cholesterol in normal range and within 360 days    Cholesterol, Total  Date Value Ref Range Status  07/04/2019 133 100 - 199 mg/dL Final         Passed - HDL in normal range and within 360 days    HDL  Date Value Ref Range Status  07/04/2019 43 >39 mg/dL Final         Passed - Patient is not pregnant      Passed - Valid encounter within last 12 months    Recent Outpatient Visits          1 week ago Annual physical exam   [facility], [place] M, PA-C   4 months ago Acute non-recurrent sinusitis, unspecified location   [place], [place], PA-C   8 months ago Abnormal uterine bleeding   [facility] [place], [place], [place]   1 year ago Acute bilateral low back pain with left-sided sciatica   [place], Anderson Malta M, [place]   2 years ago Arthralgia of left wrist   [place], Jennifer M, [place]             . metFORMIN (GLUCOPHAGE) 1000 MG tablet [Pharmacy Med Name: METFORMIN 1000MG TABLETS] 180 tablet 3    Sig: TAKE 1 TABLET BY MOUTH TWICE DAILY     Endocrinology:  Diabetes - Biguanides Passed - 09/09/2019  7:01 AM      Passed - Cr in normal range and within 360 days    Creatinine  Date Value Ref Range Status  12/31/2013 1.08 0.60 - 1.30 mg/dL Final   Creatinine, Ser  Date Value  Ref Range Status  07/04/2019 0.85 0.57 - 1.00 mg/dL Final         Passed - HBA1C is between 0 and 7.9 and within 180 days    Hgb A1c MFr Bld  Date Value Ref Range Status  07/04/2019 7.6 (H) 4.8 - 5.6 % Final    Comment:             Prediabetes: 5.7 - 6.4          Diabetes: >6.4          Glycemic control for adults with diabetes: <7.0          Passed - eGFR in normal range and within 360 days    EGFR (African American)  Date Value Ref Range Status  12/31/2013 >60  Final   GFR calc Af Amer  Date Value Ref Range Status  07/04/2019 92 >59 mL/min/1.73 Final   EGFR (Non-African Amer.)  Date Value Ref Range Status  12/31/2013 >60  Final    Comment:    eGFR values <  37m/min/1.73 m2 may be an indication of chronic kidney disease (CKD). Calculated eGFR is useful in patients with stable renal function. The eGFR calculation will not be reliable in acutely ill patients when serum creatinine is changing rapidly. It is not useful in  patients on dialysis. The eGFR calculation may not be applicable to patients at the low and high extremes of body sizes, pregnant women, and vegetarians.    GFR calc non Af Amer  Date Value Ref Range Status  07/04/2019 80 >59 mL/min/1.73 Final         Passed - Valid encounter within last 6 months    Recent Outpatient Visits          1 week ago [place]  4 months ago Acute non-recurrent sinusitis, unspecified location   [place] [facility]  8 months ago Abnormal uterine bleeding   [place]  1 year ago Acute bilateral low back pain with left-sided sciatica   [place]  2 years ago Arthralgia of left wrist   [facility]

## 2019-10-08 ENCOUNTER — Other Ambulatory Visit: Payer: Self-pay | Admitting: Physician Assistant

## 2019-10-08 DIAGNOSIS — J309 Allergic rhinitis, unspecified: Secondary | ICD-10-CM

## 2019-10-16 LAB — COLOGUARD
COLOGUARD: NEGATIVE
Cologuard: NEGATIVE

## 2019-10-19 ENCOUNTER — Telehealth: Payer: Self-pay

## 2019-10-19 NOTE — Telephone Encounter (Signed)
Cologuard is negative.  Written by Margaretann Loveless, PA-C on 10/19/2019 8:14 AM EDT Seen by patient Andee Poles on 10/19/2019 8:18 AM EDT

## 2019-10-19 NOTE — Telephone Encounter (Signed)
-----   Message from Margaretann Loveless, [place] sent at 10/19/2019  8:14 AM EDT ----- Cologuard is negative.

## 2019-11-16 LAB — HM DIABETES EYE EXAM

## 2019-11-21 ENCOUNTER — Encounter: Payer: Self-pay | Admitting: Physician Assistant

## 2019-12-07 ENCOUNTER — Other Ambulatory Visit: Payer: Self-pay | Admitting: Physician Assistant

## 2019-12-07 DIAGNOSIS — E7800 Pure hypercholesterolemia, unspecified: Secondary | ICD-10-CM

## 2020-02-15 ENCOUNTER — Telehealth (INDEPENDENT_AMBULATORY_CARE_PROVIDER_SITE_OTHER): Payer: 59 | Admitting: Physician Assistant

## 2020-02-15 ENCOUNTER — Encounter: Payer: Self-pay | Admitting: Physician Assistant

## 2020-02-15 VITALS — Temp 99.3°F

## 2020-02-15 DIAGNOSIS — J0140 Acute pansinusitis, unspecified: Secondary | ICD-10-CM | POA: Diagnosis not present

## 2020-02-15 MED ORDER — AMOXICILLIN-POT CLAVULANATE 875-125 MG PO TABS: 1.0000 | ORAL_TABLET | Freq: Two times a day (BID) | ORAL | 0 refills | Status: DC

## 2020-02-15 NOTE — Progress Notes (Signed)
MyChart Video Visit    Virtual Visit via Video Note   This visit type was conducted due to national recommendations for restrictions regarding the COVID-19 Pandemic (e.g. social distancing) in an effort to limit this patient's exposure and mitigate transmission in our community. This patient is at least at moderate risk for complications without adequate follow up. This format is felt to be most appropriate for this patient at this time. Physical exam was limited by quality of the video and audio technology used for the visit.   I connected with [name] on 02/15/20 at  5:40 PM EDT by a video enabled telemedicine application and verified that I am speaking with the correct person using two identifiers.  I discussed the limitations of evaluation and management by telemedicine and the availability of in person appointments. The patient expressed understanding and agreed to proceed.  Patient location: Home Provider location: [facility]   Patient: Lisa Rivers   DOB: 1970-08-05   49 y.o. Female  MRN: [id number] Visit Date: 02/15/2020  Today's healthcare provider: Margaretann Loveless, PA-C   Chief Complaint  Patient presents with  . URI   Subjective    URI  This is a new problem. The current episode started 1 to 4 weeks ago (for 2 weeks). The problem has been gradually worsening. Maximum temperature: Yesterday it 99.4. Associated symptoms include congestion, coughing, ear pain, headaches, rhinorrhea, sinus pain, sneezing and a sore throat. Pertinent negatives include no abdominal pain, chest pain, diarrhea, nausea, vomiting or wheezing. She has tried acetaminophen and antihistamine (Nasal spray, Benadryl and allergy at night) for the symptoms. The treatment provided no relief.     Patient Active Problem List   Diagnosis Date Noted  . Knee pain 05/18/2017  . Patellofemoral stress syndrome 05/18/2017  . Right rotator cuff tendinitis 04/14/2016  . Allergic rhinitis  02/06/2015  . Abortion, spontaneous 12/30/2014  . Carpal tunnel syndrome 12/30/2014  . Diabetes (HCC) 12/30/2014  . Hypercholesterolemia without hypertriglyceridemia 12/30/2014  . Avitaminosis D 12/30/2014   Past Medical History:  Diagnosis Date  . ACL tear   . Adopted   . Arthritis   . Asthma   . Diabetes mellitus without complication (HCC)   . Headache    MIRGRAINES  . Hyperlipidemia       Medications: Outpatient Medications Prior to Visit  Medication Sig  . albuterol (PROAIR HFA) 108 (90 BASE) MCG/ACT inhaler Inhale 2 puffs into the lungs every 6 (six) hours as needed for wheezing or shortness of breath.   Marland Kitchen b complex vitamins capsule Take 1 capsule by mouth daily.  . Cholecalciferol (VITAMIN D PO) Take 2 tablets by mouth daily. 1500 iu per tablet  . cyclobenzaprine (FLEXERIL) 5 MG tablet Take 1 tablet (5 mg total) by mouth 3 (three) times daily as needed for muscle spasms.  . fluticasone (FLONASE) 50 MCG/ACT nasal spray SHAKE LIQUID AND USE 2 SPRAYS IN EACH NOSTRIL DAILY AS NEEDED  . hydrocortisone cream 0.5 % Apply topically.  . metFORMIN (GLUCOPHAGE) 1000 MG tablet TAKE 1 TABLET BY MOUTH TWICE DAILY  . Multiple Vitamin (MULTIVITAMIN WITH MINERALS) TABS tablet Take 1 tablet by mouth daily.  . simvastatin (ZOCOR) 20 MG tablet TAKE 1 TABLET BY MOUTH AT BEDTIME   No facility-administered medications prior to visit.    Review of Systems  Constitutional: Positive for fever. Negative for chills and fatigue.  HENT: Positive for congestion, ear pain, postnasal drip, rhinorrhea, sinus pressure, sinus pain, sneezing and sore throat.  Respiratory: Positive for cough. Negative for chest tightness, shortness of breath and wheezing.   Cardiovascular: Negative for chest pain, palpitations and leg swelling.  Gastrointestinal: Negative for abdominal pain, diarrhea, nausea and vomiting.  Neurological: Positive for headaches.    Last CBC Lab Results  Component Value Date   WBC 9.8  07/04/2019   HGB 13.4 07/04/2019   HCT 40.2 07/04/2019   MCV 87 07/04/2019   MCH 28.8 07/04/2019   RDW 12.5 07/04/2019   PLT 394 07/04/2019   Last metabolic panel Lab Results  Component Value Date   GLUCOSE 190 (H) 07/04/2019   NA 137 07/04/2019   K 3.9 07/04/2019   CL 98 07/04/2019   CO2 24 07/04/2019   BUN 10 07/04/2019   CREATININE 0.85 07/04/2019   GFRNONAA 80 07/04/2019   GFRAA 92 07/04/2019   CALCIUM 9.3 07/04/2019   PROT 6.6 07/04/2019   ALBUMIN 4.2 07/04/2019   LABGLOB 2.4 07/04/2019   AGRATIO 1.8 07/04/2019   BILITOT 0.4 07/04/2019   ALKPHOS 68 07/04/2019   AST 24 07/04/2019   ALT 32 07/04/2019   ANIONGAP 7 10/27/2016      Objective    Temp 99.3 F (37.4 C) (Tympanic)  BP Readings from Last 3 Encounters:  08/30/19 (!) 153/87  07/28/18 (!) 149/87  09/08/17 126/78   Wt Readings from Last 3 Encounters:  08/30/19 192 lb 6.4 oz (87.3 kg)  07/28/18 193 lb (87.5 kg)  09/08/17 191 lb (86.6 kg)      Physical Exam Vitals reviewed.  Constitutional:      General: She is not in acute distress.    Appearance: She is well-developed. She is ill-appearing.  HENT:     Head: Normocephalic and atraumatic.  Pulmonary:     Effort: Pulmonary effort is normal. No respiratory distress.  Musculoskeletal:     Cervical back: Normal range of motion and neck supple.  Neurological:     Mental Status: She is alert.  Psychiatric:        Mood and Affect: Mood normal.        Behavior: Behavior normal.        Thought Content: Thought content normal.        Judgment: Judgment normal.        Assessment & Plan     1. Acute non-recurrent pansinusitis Patient is undergoing covid 19 testing, however she does get seasonal sinus infections. Will send in Augmentin to start. If covid 19 testing is negative, continue until completed. If positive, can stop antibiotic. Continue allergy medications and inhalers as needed. Call if worsening.  - amoxicillin-clavulanate (AUGMENTIN)  875-125 MG tablet; Take 1 tablet by mouth 2 (two) times daily.  Dispense: 20 tablet; Refill: 0   No follow-ups on file.     I discussed the assessment and treatment plan with the patient. The patient was provided an opportunity to ask questions and all were answered. The patient agreed with the plan and demonstrated an understanding of the instructions.   The patient was advised to call back or seek an in-person evaluation if the symptoms worsen or if the condition fails to improve as anticipated.  I provided 14 minutes of non-face-to-face time during this encounter.  Delmer Islam, PA-C, have reviewed all documentation for this visit. The documentation on 02/26/20 for the exam, diagnosis, procedures, and orders are all accurate and complete.  Reine Just [facility] [phone] (phone) [phone] (fax)  [facility]

## 2020-02-18 ENCOUNTER — Other Ambulatory Visit: Payer: Self-pay

## 2020-02-18 ENCOUNTER — Other Ambulatory Visit: Payer: 59

## 2020-02-18 DIAGNOSIS — Z20822 Contact with and (suspected) exposure to covid-19: Secondary | ICD-10-CM

## 2020-02-19 LAB — NOVEL CORONAVIRUS, NAA: SARS-CoV-2, NAA: NOT DETECTED

## 2020-02-19 LAB — SARS-COV-2, NAA 2 DAY TAT

## 2020-02-20 ENCOUNTER — Telehealth: Payer: Self-pay | Admitting: Physician Assistant

## 2020-02-20 ENCOUNTER — Ambulatory Visit: Payer: Self-pay

## 2020-02-20 ENCOUNTER — Telehealth (INDEPENDENT_AMBULATORY_CARE_PROVIDER_SITE_OTHER): Payer: 59 | Admitting: Family Medicine

## 2020-02-20 DIAGNOSIS — R05 Cough: Secondary | ICD-10-CM | POA: Diagnosis not present

## 2020-02-20 DIAGNOSIS — J4521 Mild intermittent asthma with (acute) exacerbation: Secondary | ICD-10-CM | POA: Diagnosis not present

## 2020-02-20 DIAGNOSIS — R059 Cough, unspecified: Secondary | ICD-10-CM

## 2020-02-20 DIAGNOSIS — J0140 Acute pansinusitis, unspecified: Secondary | ICD-10-CM | POA: Diagnosis not present

## 2020-02-20 DIAGNOSIS — E119 Type 2 diabetes mellitus without complications: Secondary | ICD-10-CM

## 2020-02-20 MED ORDER — AZITHROMYCIN 250 MG PO TABS: ORAL_TABLET | ORAL | 0 refills | Status: DC

## 2020-02-20 MED ORDER — PREDNISONE 10 MG (21) PO TBPK: ORAL_TABLET | ORAL | 1 refills | Status: DC

## 2020-02-20 NOTE — Telephone Encounter (Signed)
error:[id number] ° °

## 2020-02-20 NOTE — Telephone Encounter (Signed)
Patient called and says she was seen virtually by Victorino Dike on Friday for sinus congestion. She now has a cough and fever from 100-100.7 since Monday night. She says she's been using this cough medicine she has from 2018 at night to help sleep and she's out of it. She says she's on antibiotics for an ear infection and sinus infection, which the ears are better. She says she's coughing up yellow phlegm. I heard a whistling sound while on the phone and asked is she wheezing. She says she didn't know what that sound was, but she hears it often. She denies SOB. I called the office and spoke to Kuna, [facility] and asked if there were any openings today for a MyChart Video visit, she advised to schedule with Dr. Sullivan Lone at 1320 today. Appointment scheduled, patient advised, she verbalized understanding.  Reason for Disposition . [1] Recent medical visit within 24 hours AND [2] condition/symptoms WORSE  Answer Assessment - Initial Assessment Questions 1. TEMPERATURE: "What is the most recent temperature?"  "How was it measured?"      100-100.7 2. ONSET: "When did the fever start?"      Monday night 3. SYMPTOMS: "Do you have any other symptoms besides the fever?"  (e.g., colds, headache, sore throat, earache, cough, rash, diarrhea, vomiting, abdominal pain)     Cough, sore throat, earache 4. CAUSE: If there are no symptoms, ask: "What do you think is causing the fever?"      I have an ear infection, sinus infection on antibiotics  5. CONTACTS: "Does anyone else in the family have an infection?"     No 6. TREATMENT: "What have you done so far to treat this fever?" (e.g., medications)     Tylenol 7. IMMUNOCOMPROMISE: "Do you have of the following: diabetes, HIV positive, splenectomy, cancer chemotherapy, chronic steroid treatment, transplant patient, etc."     Diabetes 8. PREGNANCY: "Is there any chance you are pregnant?" "When was your last menstrual period?"     No 9. TRAVEL: "Have you traveled out of the  country in the last month?" (e.g., travel history, exposures)     No  Answer Assessment - Initial Assessment Questions 1. MAIN CONCERN OR SYMPTOM:  "What is your main concern right now?" "What question do you have?" "What's the main symptom you're worried about?" (e.g., breathing difficulty, cough, fever. pain)     Cough, fever 2. ONSET: "When did the symptoms start?"     Couple weeks 3. BETTER-SAME-WORSE: "Are you getting better, staying the same, or getting worse compared to how you felt at your last visit to the doctor (most recent medical visit)?"     Worse because fever is higher, cough is worse 4. VISIT DATE: "When were you seen?" (Date)     02/15/20 virtual 5. VISIT DOCTOR: "What is the name of the doctor taking care of you now?"     [employer], PA-C 6. VISIT DIAGNOSIS:  "What was the main symptom or problem that you were seen for?" "Were you given a diagnosis?"      Sinus congestion 7. VISIT MEDICATIONS: "Did the physician order any new medicines for you to use?" If Yes, ask: "Have you filled the prescription and started taking the medicine?"      Amoxicillin 8. NEXT APPOINTMENT: "Have you scheduled a follow-up appointment with your doctor?"     No 9. PAIN: "Is there any pain?" If Yes, ask: "How bad is it?"  (Scale 1-10; or mild, moderate, severe)  No 10. FEVER: "Do you have a fever?" If Yes, ask: "What is it, how was it measured  and when did it start?"      Yes, 100-100.7 11. OTHER SYMPTOMS: "Do you have any other symptoms?"      Cough, fever, sore throat  Protocols used: RECENT MEDICAL VISIT FOR ILLNESS FOLLOW-UP CALL-A-AH, FEVER-A-AH

## 2020-02-20 NOTE — Progress Notes (Signed)
MyChart Video Visit Virtual Visit via Telephone Note  I connected with Lisa Rivers on 02/20/20 at  1:20 PM EDT by telephone and verified that I am speaking with the correct person using two identifiers.  Location: Patient: Home Provider: Office   I discussed the limitations, risks, security and privacy concerns of performing an evaluation and management service by telephone and the availability of in person appointments. I also discussed with the patient that there may be a patient responsible charge related to this service. The patient expressed understanding and agreed to proceed.   History of Present Illness:    Observations/Objective:   Assessment and Plan:   Follow Up Instructions:    I discussed the assessment and treatment plan with the patient. The patient was provided an opportunity to ask questions and all were answered. The patient agreed with the plan and demonstrated an understanding of the instructions.   The patient was advised to call back or seek an in-person evaluation if the symptoms worsen or if the condition fails to improve as anticipated.  I provided 14 minutes of non-face-to-face time during this encounter.   [name], MD    Virtual Visit via Video Note   This visit type was conducted due to national recommendations for restrictions regarding the COVID-19 Pandemic (e.g. social distancing) in an effort to limit this patient's exposure and mitigate transmission in our community. This patient is at least at moderate risk for complications without adequate follow up. This format is felt to be most appropriate for this patient at this time. Physical exam was limited by quality of the video and audio technology used for the visit.   Patient location: Home Provider location: Office   Patient: Lisa Rivers   DOB: September 20, 1970   49 y.o. Female  MRN: [id number] Visit Date: 02/20/2020  Today's healthcare provider: Megan Mans,  MD   Chief Complaint  Patient presents with  . URI   Subjective    Cough This is a new problem. The current episode started in the past 7 days. The problem has been waxing and waning. The problem occurs every few minutes. The cough is non-productive. Associated symptoms include ear congestion, a fever, headaches, nasal congestion, postnasal drip, rhinorrhea, a sore throat, sweats and wheezing. Pertinent negatives include no chest pain, chills, ear pain, heartburn or shortness of breath. The symptoms are aggravated by lying down. She has tried OTC cough suppressant (allergy medication) for the symptoms. The treatment provided no relief. Her past medical history is significant for asthma, bronchitis and environmental allergies.    Patient had a negative Covid 2 days ago.  Since then she has developed a cough and wheezing.  She is not short of breath. She does do have well-controlled diabetes on Metformin.  She states before the Metformin that prednisone put her sugar out of control.  Again, no shortness of breath.       Medications: Outpatient Medications Prior to Visit  Medication Sig  . albuterol (PROAIR HFA) 108 (90 BASE) MCG/ACT inhaler Inhale 2 puffs into the lungs every 6 (six) hours as needed for wheezing or shortness of breath.   Marland Kitchen amoxicillin-clavulanate (AUGMENTIN) 875-125 MG tablet Take 1 tablet by mouth 2 (two) times daily.  Marland Kitchen b complex vitamins capsule Take 1 capsule by mouth daily.  . Cholecalciferol (VITAMIN D PO) Take 2 tablets by mouth daily. 1500 iu per tablet  . cyclobenzaprine (FLEXERIL) 5 MG tablet Take 1 tablet (5 mg total) by  mouth 3 (three) times daily as needed for muscle spasms.  . fluticasone (FLONASE) 50 MCG/ACT nasal spray SHAKE LIQUID AND USE 2 SPRAYS IN EACH NOSTRIL DAILY AS NEEDED  . hydrocortisone cream 0.5 % Apply topically.  . metFORMIN (GLUCOPHAGE) 1000 MG tablet TAKE 1 TABLET BY MOUTH TWICE DAILY  . Multiple Vitamin (MULTIVITAMIN WITH MINERALS) TABS  tablet Take 1 tablet by mouth daily.  . simvastatin (ZOCOR) 20 MG tablet TAKE 1 TABLET BY MOUTH AT BEDTIME   No facility-administered medications prior to visit.    Review of Systems  Constitutional: Positive for fever. Negative for chills.  HENT: Positive for postnasal drip, rhinorrhea and sore throat. Negative for ear pain.   Respiratory: Positive for cough and wheezing. Negative for shortness of breath.   Cardiovascular: Negative for chest pain.  Gastrointestinal: Negative for heartburn.  Allergic/Immunologic: Positive for environmental allergies.  Neurological: Positive for headaches.       Objective    There were no vitals taken for this visit.    Physical Exam  Patient has some laryngitis but no obvious shortness of breath or problems speaking in complete sentences. She is alert and oriented  Assessment & Plan     1. Mild intermittent asthmatic bronchitis with acute exacerbation Treat with prednisone short 6-day taper and Z-Pak. - predniSONE (STERAPRED UNI-PAK 21 TAB) 10 MG (21) TBPK tablet; Taper 6-5-4-3-2-1  Dispense: 21 tablet; Refill: 1 - azithromycin (ZITHROMAX) 250 MG tablet; 2 po day 1 then 1 po days 2-5  Dispense: 6 tablet; Refill: 0  2. Acute non-recurrent pansinusitis   3. Cough If the patient develops any shortness of breath she is to go back to emergency room.  She is not vaccinated for Covid at this time.  We discussed getting a vaccine once that she is over this illness.  4. Type 2 diabetes mellitus without complication, unspecified whether long term insulin use (HCC) Controlled on Metformin.   No follow-ups on file.     I discussed the assessment and treatment plan with the patient. The patient was provided an opportunity to ask questions and all were answered. The patient agreed with the plan and demonstrated an understanding of the instructions.   The patient was advised to call back or seek an in-person evaluation if the symptoms worsen or if  the condition fails to improve as anticipated.  I provided 14 minutes of non-face-to-face time during this encounter.    [name], MD [facility] [phone] (phone) [phone] (fax)  [facility]

## 2020-02-20 NOTE — Telephone Encounter (Signed)
Patient of Jennis, advised virtual visit to be assessed, only opening in office was 1:20Pm with Dr. Sullivan Lone. KW

## 2020-02-25 ENCOUNTER — Telehealth: Payer: Self-pay | Admitting: Physician Assistant

## 2020-02-25 DIAGNOSIS — J4521 Mild intermittent asthma with (acute) exacerbation: Secondary | ICD-10-CM

## 2020-02-25 MED ORDER — BENZONATATE 200 MG PO CAPS: 200.0000 mg | ORAL_CAPSULE | Freq: Three times a day (TID) | ORAL | 0 refills | Status: DC | PRN

## 2020-02-25 NOTE — Telephone Encounter (Signed)
Patient is calling regarding the predniSONE (STERAPRED UNI-PAK 21 TAB) 10 MG (21) TBPK tablet [[id number]] Patient still does not feel the greatest. She has one refill on the the prednisone. Should she refill the medication? Please advise CB- [phone]

## 2020-02-25 NOTE — Telephone Encounter (Signed)
Yes she can refill prednisone

## 2020-02-25 NOTE — Addendum Note (Signed)
Addended by: Margaretann Loveless on: 02/25/2020 03:12 PM   Modules accepted: Orders

## 2020-02-25 NOTE — Telephone Encounter (Signed)
Pt advised.  She would also like an RX for cough medicine sent to [facility] in [place].   Thanks,   -Vernona Rieger

## 2020-02-25 NOTE — Telephone Encounter (Signed)
Tessalon perles sent in

## 2020-02-25 NOTE — Addendum Note (Signed)
Addended by: Kavin Leech E on: 02/25/2020 03:10 PM   Modules accepted: Orders

## 2020-02-26 ENCOUNTER — Encounter: Payer: Self-pay | Admitting: Physician Assistant

## 2020-03-04 ENCOUNTER — Other Ambulatory Visit: Payer: Self-pay | Admitting: Physician Assistant

## 2020-03-04 DIAGNOSIS — E7800 Pure hypercholesterolemia, unspecified: Secondary | ICD-10-CM

## 2020-03-23 ENCOUNTER — Emergency Department: Payer: 59

## 2020-03-23 ENCOUNTER — Encounter: Payer: Self-pay | Admitting: Radiology

## 2020-03-23 ENCOUNTER — Emergency Department
Admission: EM | Admit: 2020-03-23 | Discharge: 2020-03-23 | Disposition: A | Discharge status: Home / Self Care | Payer: 59 | Attending: Emergency Medicine | Admitting: Emergency Medicine

## 2020-03-23 ENCOUNTER — Other Ambulatory Visit: Payer: Self-pay

## 2020-03-23 DIAGNOSIS — R300 Dysuria: Secondary | ICD-10-CM

## 2020-03-23 DIAGNOSIS — Z7951 Long term (current) use of inhaled steroids: Secondary | ICD-10-CM | POA: Diagnosis not present

## 2020-03-23 DIAGNOSIS — Z79899 Other long term (current) drug therapy: Secondary | ICD-10-CM | POA: Diagnosis not present

## 2020-03-23 DIAGNOSIS — N200 Calculus of kidney: Secondary | ICD-10-CM | POA: Diagnosis not present

## 2020-03-23 DIAGNOSIS — R109 Unspecified abdominal pain: Secondary | ICD-10-CM

## 2020-03-23 DIAGNOSIS — E119 Type 2 diabetes mellitus without complications: Secondary | ICD-10-CM | POA: Insufficient documentation

## 2020-03-23 DIAGNOSIS — Z7984 Long term (current) use of oral hypoglycemic drugs: Secondary | ICD-10-CM | POA: Insufficient documentation

## 2020-03-23 DIAGNOSIS — J45909 Unspecified asthma, uncomplicated: Secondary | ICD-10-CM | POA: Diagnosis not present

## 2020-03-23 LAB — URINALYSIS, COMPLETE (UACMP) WITH MICROSCOPIC
Bacteria, UA: NONE SEEN
Bilirubin Urine: NEGATIVE
Glucose, UA: >=500 mg/dL — AB
Ketones, ur: 20 mg/dL — AB
Leukocytes,Ua: NEGATIVE
Nitrite: NEGATIVE
Protein, ur: NEGATIVE mg/dL
Specific Gravity, Urine: 1.028 (ref 1.005–1.030)
pH: 5 (ref 5.0–8.0)

## 2020-03-23 LAB — BASIC METABOLIC PANEL
Anion gap: 12 (ref 5–15)
BUN: 19 mg/dL (ref 6–20)
CO2: 24 mmol/L (ref 22–32)
Calcium: 9.2 mg/dL (ref 8.9–10.3)
Chloride: 99 mmol/L (ref 98–111)
Creatinine, Ser: 1.11 mg/dL — ABNORMAL HIGH (ref 0.44–1.00)
GFR calc Af Amer: >60 mL/min (ref >60)
GFR calc non Af Amer: 58 mL/min — ABNORMAL LOW (ref >60)
Glucose, Bld: 307 mg/dL — ABNORMAL HIGH (ref 70–99)
Potassium: 4 mmol/L (ref 3.5–5.1)
Sodium: 135 mmol/L (ref 135–145)

## 2020-03-23 LAB — CBC
HCT: 39.8 % (ref 36.0–46.0)
Hemoglobin: 13.4 g/dL (ref 12.0–15.0)
MCH: 29.1 pg (ref 26.0–34.0)
MCHC: 33.7 g/dL (ref 30.0–36.0)
MCV: 86.3 fL (ref 80.0–100.0)
Platelets: 402 10*3/uL — ABNORMAL HIGH (ref 150–400)
RBC: 4.61 MIL/uL (ref 3.87–5.11)
RDW: 12.8 % (ref 11.5–15.5)
WBC: 13.8 10*3/uL — ABNORMAL HIGH (ref 4.0–10.5)
nRBC: 0 % (ref 0.0–0.2)

## 2020-03-23 LAB — PREGNANCY, URINE: Preg Test, Ur: NEGATIVE

## 2020-03-23 MED ORDER — IOHEXOL 300 MG/ML  SOLN
100.0000 mL | Freq: Once | INTRAMUSCULAR | Status: AC | PRN
  Administered 2020-03-23: 100 mL via INTRAVENOUS

## 2020-03-23 NOTE — ED Notes (Signed)
Pt resting in stretcher, family at bedside, pending CT

## 2020-03-23 NOTE — ED Triage Notes (Signed)
Pt arrived via POV with reports of dysuria and right side flank pain, pt states she was dx with UTI and states the MD on demand sent in a RX for her to pick up in the morning, but states the pain worsened and she started vomiting.  No hx of UTI or kidney stones.

## 2020-03-23 NOTE — ED Provider Notes (Signed)
[facility] Emergency Department Provider Note   ____________________________________________   First MD Initiated Contact with Patient 03/23/20 1021     (approximate)  I have reviewed the triage vital signs and the nursing notes.   HISTORY  Chief Complaint Flank Pain and Dysuria    HPI Lisa Rivers is a 49 y.o. female with a stated past medical history of type 2 diabetes and hyperlipidemia presents for right-sided lower quadrant and flank pain that began approximately 12 hours prior to arrival and has been worsening since onset.  Patient describes the pain as colicky, 9/10, and radiating into the left groin.  Also endorses associated nausea and vomiting.         Past Medical History:  Diagnosis Date  . ACL tear   . Adopted   . Arthritis   . Asthma   . Diabetes mellitus without complication (HCC)   . Headache    MIRGRAINES  . Hyperlipidemia     Patient Active Problem List   Diagnosis Date Noted  . Knee pain 05/18/2017  . Patellofemoral stress syndrome 05/18/2017  . Right rotator cuff tendinitis 04/14/2016  . Allergic rhinitis 02/06/2015  . Abortion, spontaneous 12/30/2014  . Carpal tunnel syndrome 12/30/2014  . Diabetes (HCC) 12/30/2014  . Hypercholesterolemia without hypertriglyceridemia 12/30/2014  . Avitaminosis D 12/30/2014    Past Surgical History:  Procedure Laterality Date  . APPENDECTOMY  12/2013  . BACK SURGERY  02/2019  . FOOT SURGERY  [id number]   BOTH FEET  . SHOULDER ARTHROSCOPY WITH BANKART REPAIR Left 11/02/2016   Procedure: SHOULDER ARTHROSCOPY WITH SUPERIOR LABRAL REPAIR , distal clavicle excision, and subacrominal decompression;  Surgeon: Juanell FairlyKevin Krasinski, MD;  Location: [facility] ORS;  Service: Orthopedics;  Laterality: Left;    Prior to Admission medications   Medication Sig Start Date End Date Taking? Authorizing Provider  albuterol (PROAIR HFA) 108 (90 BASE) MCG/ACT inhaler Inhale 2 puffs into the lungs every 6  (six) hours as needed for wheezing or shortness of breath.     [provider]  amoxicillin-clavulanate (AUGMENTIN) 875-125 MG tablet Take 1 tablet by mouth 2 (two) times daily. 02/15/20   Margaretann LovelessBurnette, Jennifer M, PA-C  azithromycin (ZITHROMAX) 250 MG tablet 2 po day 1 then 1 po days 2-5 02/20/20   Maple HudsonGilbert, Richard L Jr., MD  b complex vitamins capsule Take 1 capsule by mouth daily.    [provider]  benzonatate (TESSALON) 200 MG capsule Take 1 capsule (200 mg total) by mouth 3 (three) times daily as needed for cough. 02/25/20   Margaretann LovelessBurnette, Jennifer M, PA-C  Cholecalciferol (VITAMIN D PO) Take 2 tablets by mouth daily. 1500 iu per tablet    [provider]  cyclobenzaprine (FLEXERIL) 5 MG tablet Take 1 tablet (5 mg total) by mouth 3 (three) times daily as needed for muscle spasms. 07/28/18   Margaretann LovelessBurnette, Jennifer M, PA-C  fluticasone (FLONASE) 50 MCG/ACT nasal spray SHAKE LIQUID AND [facility] AS NEEDED 10/08/19   Margaretann LovelessBurnette, Jennifer M, PA-C  hydrocortisone cream 0.5 % Apply topically.    [provider]  metFORMIN (GLUCOPHAGE) 1000 MG tablet TAKE 1 TABLET BY MOUTH TWICE DAILY 09/09/19   Margaretann LovelessBurnette, Jennifer M, PA-C  Multiple Vitamin (MULTIVITAMIN WITH MINERALS) TABS tablet Take 1 tablet by mouth daily.    [provider]  predniSONE (STERAPRED UNI-PAK 21 TAB) 10 MG (21) TBPK tablet Taper 6-5-4-3-2-1 02/20/20   Maple HudsonGilbert, Richard L Jr., MD  simvastatin (ZOCOR) 20 MG tablet  TAKE 1 TABLET BY MOUTH AT BEDTIME 03/04/20   Margaretann Loveless, PA-C    Allergies Tape  Family History  Adopted: Yes  Problem Relation Age of Onset  . Coronary artery disease Mother   . Alcohol abuse Mother   . Heart disease Mother   . Diabetes Father   . Alcohol abuse Father   . Diabetes Brother     Social History Social History   Tobacco Use  . Smoking status: Never Smoker  . Smokeless tobacco: Never Used  Vaping Use  . Vaping Use: Never used  Substance Use  Topics  . Alcohol use: No    Alcohol/week: 0.0 standard drinks  . Drug use: No    Review of Systems Constitutional: No fever/chills Eyes: No visual changes. ENT: No sore throat. Cardiovascular: Denies chest pain. Respiratory: Denies shortness of breath. Gastrointestinal: Endorses abdominal pain.  Endorses nausea, no vomiting.  No diarrhea. Genitourinary: Negative for dysuria. Musculoskeletal: Negative for acute arthralgias Skin: Negative for rash. Neurological: Negative for headaches, weakness/numbness/paresthesias in any extremity Psychiatric: Negative for suicidal ideation/homicidal ideation   ____________________________________________   PHYSICAL EXAM:  VITAL SIGNS: ED Triage Vitals  Enc Vitals Group     BP 03/23/20 0547 (!) 144/78     Pulse Rate 03/23/20 0547 93     Resp 03/23/20 0547 16     Temp 03/23/20 0547 98.3 F (36.8 C)     Temp Source 03/23/20 0547 Oral     SpO2 03/23/20 0547 99 %     Weight 03/23/20 0546 173 lb (78.5 kg)     Height 03/23/20 0546 5\' 2"  (1.575 m)     Head Circumference --      Peak Flow --      Pain Score 03/23/20 0551 8     Pain Loc --      Pain Edu? --      Excl. in GC? --    Constitutional: Alert and oriented. Well appearing and in no acute distress. Eyes: Conjunctivae are normal. PERRL. EOMI. Head: Atraumatic. Nose: No congestion/rhinnorhea. Mouth/Throat: Mucous membranes are moist. Neck: No stridor Cardiovascular: Normal rate, regular rhythm. Grossly normal heart sounds.  Good peripheral circulation. Respiratory: Normal respiratory effort.  No retractions. Gastrointestinal: Soft and nontender. No distention. Musculoskeletal: No lower extremity tenderness nor edema.  No joint effusions. Neurologic:  Normal speech and language. No gross focal neurologic deficits are appreciated. Skin:  Skin is warm and dry. No rash noted. Psychiatric: Mood and affect are normal. Speech and behavior are  normal.  ____________________________________________   LABS (all labs ordered are listed, but only abnormal results are displayed)  Labs Reviewed  URINALYSIS, COMPLETE (UACMP) WITH MICROSCOPIC - Abnormal; Notable for the following components:      Result Value   Color, Urine YELLOW (*)    APPearance HAZY (*)    Glucose, UA >=500 (*)    Hgb urine dipstick SMALL (*)    Ketones, ur 20 (*)    All other components within normal limits  CBC - Abnormal; Notable for the following components:   WBC 13.8 (*)    Platelets 402 (*)    All other components within normal limits  BASIC METABOLIC PANEL - Abnormal; Notable for the following components:   Glucose, Bld 307 (*)    Creatinine, Ser 1.11 (*)    GFR calc non Af Amer 58 (*)    All other components within normal limits  PREGNANCY, URINE   _________________________________  RADIOLOGY  ED MD interpretation: CT  of the abdomen and pelvis with IV contrast shows a punctate kidney stone in the right ureter  Official radiology report(s): CT Abdomen Pelvis W Contrast  Result Date: 03/23/2020 CLINICAL DATA:  Right-sided flank pain. UTI. Evaluate for nephrolithiasis. EXAM: CT ABDOMEN AND PELVIS WITH CONTRAST TECHNIQUE: Multidetector CT imaging of the abdomen and pelvis was performed using the standard protocol following bolus administration of intravenous contrast. CONTRAST:  OMNIPAQUE IOHEXOL 300 MG/ML  SOLN COMPARISON:  CT abdomen pelvis-12/31/2013 FINDINGS: Lower chest: Limited visualization of the lower thorax demonstrates minimal dependent subpleural ground-glass atelectasis. No discrete focal airspace opacities. No pleural effusion. Normal heart size.  No pericardial effusion. Hepatobiliary: Normal hepatic contour. There is diffuse decreased attenuation of the hepatic parenchyma on this postcontrast examination suggestive of hepatic steatosis. No discrete hepatic lesions. Normal appearance of the gallbladder given degree of distention. No  radiopaque gallstones. No intra or extrahepatic biliary ductal dilatation. No ascites. Pancreas: Normal appearance of the pancreas. Spleen: Normal appearance of the spleen. Adrenals/Urinary Tract: There is symmetric enhancement and excretion of the bilateral kidneys. Mild right-sided ureterectasis and pelviectasis with associated right-sided perinephric stranding secondary to a suspected punctate (1 mm) stone at the level of the right UVJ (coronal image 53, series 6). This finding is associated with a minimal amount of asymmetric right-sided perinephric stranding. No additional renal stones are seen on this postcontrast examination. No evidence of left-sided urinary obstruction. Normal appearance the bilateral adrenal glands. Normal appearance of the urinary bladder given degree of distention. Stomach/Bowel: Moderate stool burden within the cecum and ascending colon. Normal appearance of the terminal ileum. Post appendectomy. No discrete areas of bowel wall thickening. No hiatal hernia. No pneumoperitoneum, pneumatosis or portal venous gas. Vascular/Lymphatic: Normal caliber of the abdominal aorta. Note is made of non conventional anatomy of the celiac trunk with a right hepatic artery arising directly from the proximal aspect of the celiac artery while the left hepatic artery arises from the common hepatic artery. The major branch vessels of the abdominal aorta appear patent on this non CTA examination. No bulky retroperitoneal, mesenteric, pelvic or inguinal lymphadenopathy. Reproductive: Normal appearance of the pelvic organs. No discrete adnexal lesion. No free fluid within the pelvic cul-de-sac. Other: Tiny mesenteric fat containing periumbilical hernia. There is a minimal amount of subcutaneous edema about the midline of the low back. Musculoskeletal: No acute or aggressive osseous abnormalities. Mild multilevel lumbar spine DDD, worse at L5-S1 with disc space height loss, endplate irregularity and small  posteriorly directed disc osteophyte complex at this location. IMPRESSION: 1. Punctate (approximately 1 mm) suspected right-sided renal stone results in mild upstream right-sided ureterectasis and pelviectasis. 2. No additional renal stones are identified on this postcontrast examination. No evidence of left-sided urinary obstruction. 3. No evidence of enteric obstruction.  Post appendectomy. 4. Suspected hepatic steatosis.  Correlation with LFTs is advised. Electronically Signed   By: Simonne Come M.D.   On: 03/23/2020 12:06    ____________________________________________   PROCEDURES  Procedure(s) performed (including Critical Care):  Procedures   ____________________________________________   INITIAL IMPRESSION / ASSESSMENT AND PLAN / ED COURSE        Patient presents for severe flank pain. Presentation most consistent with Renal Colic from a Non-infected Kidney Stone. Given History and Exam I have lower suspicion for atypical appendicitis, genital torsion, acute cholecystitis, AAA, Aortic Dissection, Serious Bacterial Illness or other emergent intraabdominal pathology.  Workup: CBC, BMP, CT Abd/Pelvis noncontrast, UA, reassess Findings: CT of the abdomen pelvis shows punctate stone in the  right ureter Bufford Buttner Reassesment: Patient tolerating PO and pain controlled Disposition:  Discharge. Strict return precautions for infected stone or PO intolerance discussed.      ____________________________________________   FINAL CLINICAL IMPRESSION(S) / ED DIAGNOSES  Final diagnoses:  Flank pain  Dysuria  Kidney stone     ED Discharge Orders    None       Note:  This document was prepared using Dragon voice recognition software and may include unintentional dictation errors.   Merwyn Katos, MD 03/23/20 1355

## 2020-04-19 ENCOUNTER — Other Ambulatory Visit: Payer: Self-pay | Admitting: Physician Assistant

## 2020-04-19 DIAGNOSIS — J309 Allergic rhinitis, unspecified: Secondary | ICD-10-CM

## 2020-05-28 ENCOUNTER — Ambulatory Visit
Admission: RE | Admit: 2020-05-28 | Discharge: 2020-05-28 | Disposition: A | Discharge status: Home / Self Care | Payer: 59 | Source: Ambulatory Visit | Attending: Physician Assistant | Admitting: Physician Assistant

## 2020-05-28 ENCOUNTER — Other Ambulatory Visit: Payer: Self-pay

## 2020-05-28 DIAGNOSIS — R921 Mammographic calcification found on diagnostic imaging of breast: Secondary | ICD-10-CM | POA: Diagnosis not present

## 2020-05-28 DIAGNOSIS — Z1231 Encounter for screening mammogram for malignant neoplasm of breast: Secondary | ICD-10-CM | POA: Diagnosis present

## 2020-05-28 DIAGNOSIS — Z1239 Encounter for other screening for malignant neoplasm of breast: Secondary | ICD-10-CM

## 2020-06-02 ENCOUNTER — Other Ambulatory Visit: Payer: Self-pay | Admitting: Physician Assistant

## 2020-06-02 DIAGNOSIS — R928 Other abnormal and inconclusive findings on diagnostic imaging of breast: Secondary | ICD-10-CM

## 2020-06-02 DIAGNOSIS — R921 Mammographic calcification found on diagnostic imaging of breast: Secondary | ICD-10-CM

## 2020-06-06 ENCOUNTER — Other Ambulatory Visit: Payer: Self-pay

## 2020-06-06 ENCOUNTER — Ambulatory Visit
Admission: RE | Admit: 2020-06-06 | Discharge: 2020-06-06 | Disposition: A | Discharge status: Home / Self Care | Payer: 59 | Source: Ambulatory Visit | Attending: Physician Assistant | Admitting: Physician Assistant

## 2020-06-06 DIAGNOSIS — R928 Other abnormal and inconclusive findings on diagnostic imaging of breast: Secondary | ICD-10-CM | POA: Diagnosis present

## 2020-06-06 DIAGNOSIS — R921 Mammographic calcification found on diagnostic imaging of breast: Secondary | ICD-10-CM

## 2020-06-09 ENCOUNTER — Other Ambulatory Visit: Payer: Self-pay | Admitting: Physician Assistant

## 2020-06-09 DIAGNOSIS — E7800 Pure hypercholesterolemia, unspecified: Secondary | ICD-10-CM

## 2020-08-15 ENCOUNTER — Ambulatory Visit: Payer: 59 | Admitting: Physician Assistant

## 2020-08-15 ENCOUNTER — Encounter: Payer: Self-pay | Admitting: Physician Assistant

## 2020-08-15 ENCOUNTER — Other Ambulatory Visit: Payer: Self-pay

## 2020-08-15 VITALS — BP 122/70 | HR 96 | Temp 98.6°F | Resp 16 | Wt 180.3 lb

## 2020-08-15 DIAGNOSIS — M7711 Lateral epicondylitis, right elbow: Secondary | ICD-10-CM

## 2020-08-15 DIAGNOSIS — G5601 Carpal tunnel syndrome, right upper limb: Secondary | ICD-10-CM

## 2020-08-15 MED ORDER — METHYLPREDNISOLONE 4 MG PO TBPK: ORAL_TABLET | ORAL | 0 refills | Status: DC

## 2020-08-15 NOTE — Patient Instructions (Signed)
Tennis Elbow  Tennis elbow (lateral epicondylitis) is inflammation of tendons in your outer forearm, near your elbow. Tendons are tissues that connect muscle to bone. When you have tennis elbow, inflammation affects the tendons that you use to bend your wrist and move your hand up. Inflammation occurs in the lower part of the upper arm bone (humerus), where the tendons connect to the bone (lateral epicondyle). Tennis elbow often affects people who play tennis, but anyone may get the condition from repeatedly extending the wrist or turning the forearm. What are the causes? This condition is usually caused by repeatedly extending the wrist, turning the forearm, and using the hands. It can result from sports or work that requires repetitive forearm movements. In some cases, it may be caused by a sudden injury. What increases the risk? You are more likely to develop tennis elbow if you play tennis or another racket sport. You also have a higher risk if you frequently use your hands for work. Besides people who play tennis, others at greater risk include:  People who use computers.  Construction workers.  People who work in factories.  Musicians.  Cooks.  Cashiers. What are the signs or symptoms? Symptoms of this condition include:  Pain and tenderness in the forearm and the outer part of the elbow. Pain may be felt only when using the arm, or it may be there all the time.  A burning feeling that starts in the elbow and spreads down the forearm.  A weak grip in the hand. How is this diagnosed? This condition is diagnosed based on your symptoms, your medical history, and a physical exam. You may also have X-rays or an MRI to:  Confirm the diagnosis.  Look for other issues.  Check for tears in the ligaments, muscles, or tendons. How is this treated? Resting and icing your arm is often the first treatment. Your health care provider may also recommend:  Medicines to reduce pain and  inflammation. These may be in the form of a pill, topical gels, or shots of a steroid medicine (cortisone).  An elbow strap to reduce stress on the area.  Physical therapy. This may include massage or exercises or both.  An elbow brace to restrict the movements that cause symptoms. If these treatments do not help relieve your symptoms, your health care provider may recommend surgery to remove damaged muscle and reattach healthy muscle to bone. Follow these instructions at home: If you have a brace or strap:  Wear the brace or strap as told by your health care provider. Remove it only as told by your health care provider.  Check the skin around the brace or strap every day. Tell your health care provider about any concerns.  Loosen the brace if your fingers tingle, become numb, or turn cold and blue.  Keep the brace clean.  If the brace or strap is not waterproof: ? Do not let it get wet. ? Cover it with a watertight covering when you take a bath or a shower. Managing pain, stiffness, and swelling  If directed, put ice on the injured area. To do this: ? If you have a removable brace or strap, remove it as told by your health care provider. ? Put ice in a plastic bag. ? Place a towel between your skin and the bag. ? Leave the ice on for 20 minutes, 2-3 times a day. ? Remove the ice if your skin turns bright red. This is very important. If you cannot   feel pain, heat, or cold, you have a greater risk of damage to the area.  Move your fingers often to reduce stiffness and swelling.   Activity  Rest your elbow and wrist and avoid activities that cause symptoms as told by your health care provider.  Do physical therapy exercises as told by your health care provider.  If you lift an object, lift it with your palm facing up. This reduces stress on your elbow. Lifestyle  If your tennis elbow is caused by sports, check your equipment and make sure that: ? You use it correctly. ? It is  good match for you.  If your tennis elbow is caused by work or computer use, take frequent breaks to stretch your arm. Talk with your employer about ways to manage your condition at work. General instructions  Take over-the-counter and prescription medicines only as told by your health care provider.  Do not use any products that contain nicotine or tobacco. These products include cigarettes, chewing tobacco, and vaping devices, such as e-cigarettes. If you need help quitting, ask your health care provider.  Keep all follow-up visits. This is important. How is this prevented?  Before and after activity: ? Warm up and stretch before being active. ? Cool down and stretch after being active. ? Give your body time to rest between periods of activity.  During activity: ? Make sure to use equipment that fits you. ? If you play tennis, put power in your stroke with your lower body. Avoid using your arm only.  Maintain physical fitness, including: ? Strength. ? Flexibility. ? Endurance.  Do exercises to strengthen the forearm muscles. Contact a health care provider if:  You have pain that gets worse or does not get better with treatment.  You have numbness or weakness in your forearm, hand, or fingers. Get help right away if:  Your pain is severe.  You cannot move your wrist. Summary  Tennis elbow (lateral epicondylitis) is inflammation of tendons in your outer forearm, near your elbow.  Common symptoms include pain and tenderness in your forearm and the outer part of your elbow.  This condition is usually caused by repeatedly extending your wrist, turning your forearm, and using your hands.  The first treatment is often resting and icing your arm to relieve symptoms. Further treatment may include taking medicine, getting physical therapy, wearing a brace or strap, or having surgery. This information is not intended to replace advice given to you by your health care provider.  Make sure you discuss any questions you have with your health care provider. Document Revised: 01/01/2020 Document Reviewed: 01/01/2020 Elsevier Patient Education  2021 [employer] Inc.  Tennis Elbow Rehab Ask your health care provider which exercises are safe for you. Do exercises exactly as told by your health care provider and adjust them as directed. It is normal to feel mild stretching, pulling, tightness, or discomfort as you do these exercises. Stop right away if you feel sudden pain or your pain gets worse. Do not begin these exercises until told by your health care provider. Stretching and range-of-motion exercises These exercises warm up your muscles and joints and improve the movement and flexibility of your elbow. Wrist flexion, assisted 1. Straighten your left / right elbow in front of you with your palm facing down toward the floor. ? If told by your health care provider, bend your left / right elbow to a 90-degree angle (right angle) at your side instead of holding it straight. 2. With   your other hand, gently push over the back of your left / right hand so your fingers point toward the floor (flexion). Stop when you feel a gentle stretch on the back of your forearm. 3. Hold this position for __________ seconds. Repeat __________ times. Complete this exercise __________ times a day.   Wrist extension, assisted 1. Straighten your left / right elbow in front of you with your palm facing up toward the ceiling. ? If told by your health care provider, bend your left / right elbow to a 90-degree angle (right angle) at your side instead of holding it straight. 2. With your other hand, gently pull your left / right hand and fingers toward the floor (extension). Stop when you feel a gentle stretch on the palm side of your forearm. 3. Hold this position for __________ seconds. Repeat __________ times. Complete this exercise __________ times a day.   Assisted forearm rotation, supination 1. Sit  or stand with your elbows at your side. 2. Bend your left / right elbow to a 90-degree angle (right angle). 3. Using your uninjured hand, turn your left / right palm up toward the ceiling (supination) until you feel a gentle stretch along the inside of your forearm. 4. Hold this position for __________ seconds. Repeat __________ times. Complete this exercise __________ times a day. Assisted forearm rotation, pronation 1. Sit or stand with your elbows at your side. 2. Bend your left / right elbow to a 90-degree angle (right angle). 3. Using your uninjured hand, turn your left / right palm down toward the floor (pronation) until you feel a gentle stretch along the outside of your forearm. 4. Hold this position for __________ seconds. Repeat __________ times. Complete this exercise __________ times a day. Strengthening exercises These exercises build strength and endurance in your forearm and elbow. Endurance is the ability to use your muscles for a long time, even after they get tired. Radial deviation 1. Stand with a __________ weight or a hammer in your left / right hand. Or, sit while holding a rubber exercise band or tubing, with your left / right forearm supported on a table or countertop. ? Position your forearm so that the thumb is facing the ceiling, as if you are going to clap your hands. This is the neutral position. 2. Raise your hand upward in front of you so your thumb moves toward the ceiling (radial deviation), or pull up on the rubber tubing. Keep your forearm and elbow still while you move your wrist only. 3. Hold this position for __________ seconds. 4. Slowly return to the starting position. Repeat __________ times. Complete this exercise __________ times a day.   Wrist extension, eccentric 1. Sit with your left / right forearm palm-down and supported on a table or other surface. Let your left / right wrist extend over the edge of the surface. 2. Hold a __________ weight or a  piece of exercise band or tubing in your left / right hand. ? If using a rubber exercise band or tubing, hold the other end of the tubing with your other hand. 3. Use your uninjured hand to move your left / right hand up toward the ceiling. 4. Take your uninjured hand away and slowly return to the starting position using only your left / right hand. Lowering your arm under tension is called eccentric extension. Repeat __________ times. Complete this exercise __________ times a day. Wrist extension Do not do this exercise if it causes pain at the outside   of your elbow. Only do this exercise once instructed by your health care provider. 1. Sit with your left / right forearm supported on a table or other surface and your palm turned down toward the floor. Let your left / right wrist extend over the edge of the surface. 2. Hold a __________ weight or a piece of rubber exercise band or tubing. ? If you are using a rubber exercise band or tubing, hold the band or tubing in place with your other hand to provide resistance. 3. Slowly bend your wrist so your hand moves up toward the ceiling (extension). Move only your wrist, keeping your forearm and elbow still. 4. Hold this position for __________ seconds. 5. Slowly return to the starting position. Repeat __________ times. Complete this exercise __________ times a day.   Forearm rotation, supination To do this exercise, you will need a lightweight hammer or rubber mallet. 1. Sit with your left / right forearm supported on a table or other surface. Bend your elbow to a 90-degree angle (right angle). Position your forearm so that your palm is facing down toward the floor, with your hand resting over the edge of the table. 2. Hold a hammer in your left / right hand. ? To make this exercise easier, hold the hammer near the head of the hammer. ? To make this exercise harder, hold the hammer near the end of the handle. 3. Without moving your wrist or elbow,  slowly rotate your forearm so your palm faces up toward the ceiling (supination). 4. Hold this position for __________ seconds. 5. Slowly return to the starting position. Repeat __________ times. Complete this exercise __________ times a day.   Shoulder blade squeeze 1. Sit in a stable chair or stand with good posture. If you are sitting down, do not let your back touch the back of the chair. 2. Your arms should be at your sides with your elbows bent to a 90-degree angle (right angle). Position your forearms so that your thumbs are facing the ceiling (neutral position). 3. Without lifting your shoulders up, squeeze your shoulder blades tightly together. 4. Hold this position for __________ seconds. 5. Slowly release and return to the starting position. Repeat __________ times. Complete this exercise __________ times a day. This information is not intended to replace advice given to you by your health care provider. Make sure you discuss any questions you have with your health care provider. Document Revised: 09/12/2019 Document Reviewed: 09/12/2019 Elsevier Patient Education  2021 Elsevier Inc.  

## 2020-08-15 NOTE — Progress Notes (Signed)
Established patient visit   Patient: Lisa Rivers   DOB: 04/04/1971   50 y.o. Female  MRN: [id number] Visit Date: 08/15/2020  Today's healthcare provider: Margaretann Loveless, PA-C   Chief Complaint  Patient presents with  . Arm Pain   Subjective    Arm Pain  There was no injury mechanism. The pain is present in the right elbow and right forearm. The quality of the pain is described as aching. The pain radiates to the right arm. The pain is mild. The pain has been constant since the incident. Associated symptoms include muscle weakness and tingling. Pertinent negatives include no numbness. The symptoms are aggravated by palpation. She has tried NSAIDs and acetaminophen for the symptoms. The treatment provided mild relief.    Patient Active Problem List   Diagnosis Date Noted  . Knee pain 05/18/2017  . Patellofemoral stress syndrome 05/18/2017  . Right rotator cuff tendinitis 04/14/2016  . Allergic rhinitis 02/06/2015  . Abortion, spontaneous 12/30/2014  . Carpal tunnel syndrome 12/30/2014  . Diabetes (HCC) 12/30/2014  . Hypercholesterolemia without hypertriglyceridemia 12/30/2014  . Avitaminosis D 12/30/2014   Past Medical History:  Diagnosis Date  . ACL tear   . Adopted   . Arthritis   . Asthma   . Diabetes mellitus without complication (HCC)   . Headache    MIRGRAINES  . Hyperlipidemia        Medications: Outpatient Medications Prior to Visit  Medication Sig  . albuterol (VENTOLIN HFA) 108 (90 Base) MCG/ACT inhaler Inhale 2 puffs into the lungs every 6 (six) hours as needed for wheezing or shortness of breath.   Marland Kitchen b complex vitamins capsule Take 1 capsule by mouth daily.  . Cholecalciferol (VITAMIN D PO) Take 2 tablets by mouth daily. 1500 iu per tablet  . cyclobenzaprine (FLEXERIL) 5 MG tablet Take 1 tablet (5 mg total) by mouth 3 (three) times daily as needed for muscle spasms.  . fluticasone (FLONASE) 50 MCG/ACT nasal spray SHAKE LIQUID AND USE 2  SPRAYS IN EACH NOSTRIL DAILY AS NEEDED  . hydrocortisone cream 0.5 % Apply topically.  . metFORMIN (GLUCOPHAGE) 1000 MG tablet TAKE 1 TABLET BY MOUTH TWICE DAILY  . Multiple Vitamin (MULTIVITAMIN WITH MINERALS) TABS tablet Take 1 tablet by mouth daily.  . simvastatin (ZOCOR) 20 MG tablet TAKE 1 TABLET BY MOUTH AT BEDTIME  . [DISCONTINUED] amoxicillin-clavulanate (AUGMENTIN) 875-125 MG tablet Take 1 tablet by mouth 2 (two) times daily.  . [DISCONTINUED] azithromycin (ZITHROMAX) 250 MG tablet 2 po day 1 then 1 po days 2-5  . [DISCONTINUED] benzonatate (TESSALON) 200 MG capsule Take 1 capsule (200 mg total) by mouth 3 (three) times daily as needed for cough.  . [DISCONTINUED] predniSONE (STERAPRED UNI-PAK 21 TAB) 10 MG (21) TBPK tablet Taper 6-5-4-3-2-1   No facility-administered medications prior to visit.    Review of Systems  Constitutional: Negative.   Respiratory: Negative.   Cardiovascular: Negative.   Gastrointestinal: Negative.   Musculoskeletal: Positive for arthralgias, myalgias and neck stiffness.  Neurological: Positive for tingling and weakness. Negative for numbness.    Last CBC Lab Results  Component Value Date   WBC 13.8 (H) 03/23/2020   HGB 13.4 03/23/2020   HCT 39.8 03/23/2020   MCV 86.3 03/23/2020   MCH 29.1 03/23/2020   RDW 12.8 03/23/2020   PLT 402 (H) 03/23/2020   Last metabolic panel Lab Results  Component Value Date   GLUCOSE 307 (H) 03/23/2020   NA 135 03/23/2020  K 4.0 03/23/2020   CL 99 03/23/2020   CO2 24 03/23/2020   BUN 19 03/23/2020   CREATININE 1.11 (H) 03/23/2020   GFRNONAA 58 (L) 03/23/2020   GFRAA >60 03/23/2020   CALCIUM 9.2 03/23/2020   PROT 6.6 07/04/2019   ALBUMIN 4.2 07/04/2019   LABGLOB 2.4 07/04/2019   AGRATIO 1.8 07/04/2019   BILITOT 0.4 07/04/2019   ALKPHOS 68 07/04/2019   AST 24 07/04/2019   ALT 32 07/04/2019   ANIONGAP 12 03/23/2020        Objective    BP 122/70 (BP Location: Left Arm, Patient Position: Sitting,  Cuff Size: Normal)   Pulse 96   Temp 98.6 F (37 C) (Oral)   Resp 16   Wt 180 lb 4.8 oz (81.8 kg)   BMI 32.98 kg/m  BP Readings from Last 3 Encounters:  08/15/20 122/70  03/23/20 (!) 144/82  08/30/19 (!) 153/87   Wt Readings from Last 3 Encounters:  08/15/20 180 lb 4.8 oz (81.8 kg)  03/23/20 173 lb (78.5 kg)  08/30/19 192 lb 6.4 oz (87.3 kg)      Physical Exam Vitals reviewed.  Constitutional:      General: She is not in acute distress.    Appearance: Normal appearance. She is well-developed and well-nourished. She is not ill-appearing.  HENT:     Head: Normocephalic and atraumatic.  Eyes:     Extraocular Movements: EOM normal.  Pulmonary:     Effort: Pulmonary effort is normal. No respiratory distress.  Musculoskeletal:     Right elbow: No swelling. Decreased range of motion. Tenderness present in lateral epicondyle.     Right wrist: No swelling, tenderness or crepitus. Normal range of motion. Normal pulse.     Cervical back: Normal range of motion and neck supple.     Comments: Positive phalen sign of right  Neurological:     Mental Status: She is alert.  Psychiatric:        Mood and Affect: Mood and affect normal.        Behavior: Behavior normal.        Thought Content: Thought content normal.        Judgment: Judgment normal.       No results found for any visits on 08/15/20.  Assessment & Plan     1. Lateral epicondylitis of right elbow Patient with a lot of tenderness over the right lateral epicondylitis. Medrol given to calm down inflammation. Advised to ice after activity. Brace during activity. Referral to [facility] placed below. Consult appreciated.  - methylPREDNISolone (MEDROL) 4 MG TBPK tablet; 6 day taper; take as directed on package instructions  Dispense: 21 tablet; Refill: 0 - Ambulatory referral to Orthopedic Surgery  2. Carpal tunnel syndrome of right wrist Discussed wearing night wrist cock-up wrist splints. Referral placed.  -  Ambulatory referral to Orthopedic Surgery   No follow-ups on file.      Delmer Islam, PA-C, have reviewed all documentation for this visit. The documentation on 08/31/20 for the exam, diagnosis, procedures, and orders are all accurate and complete.   Reine Just  [facility] [phone] (phone) [phone] (fax)  [facility]

## 2020-08-28 ENCOUNTER — Telehealth: Payer: Self-pay

## 2020-08-28 NOTE — Telephone Encounter (Signed)
Copied from CRM [id number]. Topic: Referral - Status >> Aug 28, 2020  9:31 AM Marylen Ponto wrote: Reason for CRM: Pt stated tomorrow will make 2 weeks and she has not heard from anyone regarding the orthopedic referral.

## 2020-08-28 NOTE — Telephone Encounter (Signed)
I spoke with Lisa Rivers about referral. [name] says that the referral has not been done because she is waiting on office note to be completed. Please let [name] know when office note is completed.

## 2020-08-31 ENCOUNTER — Encounter: Payer: Self-pay | Admitting: Physician Assistant

## 2020-09-04 ENCOUNTER — Other Ambulatory Visit: Payer: Self-pay | Admitting: Physician Assistant

## 2020-09-04 ENCOUNTER — Encounter: Payer: Self-pay | Admitting: Physician Assistant

## 2020-09-04 DIAGNOSIS — E119 Type 2 diabetes mellitus without complications: Secondary | ICD-10-CM

## 2020-09-04 DIAGNOSIS — E7800 Pure hypercholesterolemia, unspecified: Secondary | ICD-10-CM

## 2020-09-05 ENCOUNTER — Encounter: Payer: Self-pay | Admitting: Physician Assistant

## 2020-09-12 ENCOUNTER — Encounter: Payer: Self-pay | Admitting: Physician Assistant

## 2020-09-12 MED ORDER — METFORMIN HCL 1000 MG PO TABS: 1000.0000 mg | ORAL_TABLET | Freq: Two times a day (BID) | ORAL | 0 refills | Status: DC

## 2020-09-12 MED ORDER — SIMVASTATIN 20 MG PO TABS: 20.0000 mg | ORAL_TABLET | Freq: Every day | ORAL | 0 refills | Status: DC

## 2020-09-12 NOTE — Addendum Note (Signed)
Addended by: Lisabeth Pick on: 09/12/2020 10:23 AM   Modules accepted: Orders

## 2020-09-12 NOTE — Telephone Encounter (Signed)
Pt states her appointment for medication management was resceduled 3 times by the office, so it is not her fault she is out of her medication.  Pt states her refills were denied. Pt has appt 3/17.  But she is now out of metFORMIN (GLUCOPHAGE) 1000 MG tablet  simvastatin (ZOCOR) 20 MG tablet  Can you please send to  [id number] - [place], [place] - [address] AT [facility] OF [address] [facility]

## 2020-09-18 ENCOUNTER — Other Ambulatory Visit: Payer: Self-pay

## 2020-09-18 ENCOUNTER — Ambulatory Visit: Payer: 59 | Admitting: Physician Assistant

## 2020-09-18 VITALS — BP 157/81 | HR 93 | Temp 99.0°F | Ht 62.0 in | Wt 183.2 lb

## 2020-09-18 DIAGNOSIS — Z0000 Encounter for general adult medical examination without abnormal findings: Secondary | ICD-10-CM

## 2020-09-18 DIAGNOSIS — J301 Allergic rhinitis due to pollen: Secondary | ICD-10-CM | POA: Diagnosis not present

## 2020-09-18 DIAGNOSIS — E7800 Pure hypercholesterolemia, unspecified: Secondary | ICD-10-CM | POA: Diagnosis not present

## 2020-09-18 DIAGNOSIS — E559 Vitamin D deficiency, unspecified: Secondary | ICD-10-CM

## 2020-09-18 DIAGNOSIS — Z6833 Body mass index (BMI) 33.0-33.9, adult: Secondary | ICD-10-CM

## 2020-09-18 DIAGNOSIS — E119 Type 2 diabetes mellitus without complications: Secondary | ICD-10-CM | POA: Diagnosis not present

## 2020-09-18 DIAGNOSIS — Z23 Encounter for immunization: Secondary | ICD-10-CM | POA: Diagnosis not present

## 2020-09-18 DIAGNOSIS — E6609 Other obesity due to excess calories: Secondary | ICD-10-CM

## 2020-09-18 DIAGNOSIS — M5442 Lumbago with sciatica, left side: Secondary | ICD-10-CM

## 2020-09-18 LAB — POCT UA - MICROALBUMIN: Microalbumin Ur, POC: 20 mg/L

## 2020-09-18 MED ORDER — SIMVASTATIN 20 MG PO TABS: 20.0000 mg | ORAL_TABLET | Freq: Every day | ORAL | 3 refills | Status: DC

## 2020-09-18 MED ORDER — FLUTICASONE PROPIONATE 50 MCG/ACT NA SUSP: NASAL | 5 refills | Status: AC

## 2020-09-18 MED ORDER — CYCLOBENZAPRINE HCL 5 MG PO TABS: 5.0000 mg | ORAL_TABLET | Freq: Three times a day (TID) | ORAL | 5 refills | Status: DC | PRN

## 2020-09-18 MED ORDER — METFORMIN HCL 1000 MG PO TABS: 1000.0000 mg | ORAL_TABLET | Freq: Two times a day (BID) | ORAL | 3 refills | Status: DC

## 2020-09-18 NOTE — Patient Instructions (Signed)
Preventive Care 84-52 Years Old, Female Preventive care refers to lifestyle choices and visits with your health care provider that can promote health and wellness. This includes:  A yearly physical exam. This is also called an annual wellness visit.  Regular dental and eye exams.  Immunizations.  Screening for certain conditions.  Healthy lifestyle choices, such as: ? Eating a healthy diet. ? Getting regular exercise. ? Not using drugs or products that contain nicotine and tobacco. ? Limiting alcohol use. What can I expect for my preventive care visit? Physical exam Your health care provider will check your:  Height and weight. These may be used to calculate your BMI (body mass index). BMI is a measurement that tells if you are at a healthy weight.  Heart rate and blood pressure.  Body temperature.  Skin for abnormal spots. Counseling Your health care provider may ask you questions about your:  Past medical problems.  Family's medical history.  Alcohol, tobacco, and drug use.  Emotional well-being.  Home life and relationship well-being.  Sexual activity.  Diet, exercise, and sleep habits.  Work and work Statistician.  Access to firearms.  Method of birth control.  Menstrual cycle.  Pregnancy history. What immunizations do I need? Vaccines are usually given at various ages, according to a schedule. Your health care provider will recommend vaccines for you based on your age, medical history, and lifestyle or other factors, such as travel or where you work.   What tests do I need? Blood tests  Lipid and cholesterol levels. These may be checked every 5 years, or more often if you are over 3 years old.  Hepatitis C test.  Hepatitis B test. Screening  Lung cancer screening. You may have this screening every year starting at age 52 if you have a 30-pack-year history of smoking and currently smoke or have quit within the past 15 years.  Colorectal cancer  screening. ? All adults should have this screening starting at age 52 and continuing until age 17. ? Your health care provider may recommend screening at age 52 if you are at increased risk. ? You will have tests every 1-10 years, depending on your results and the type of screening test.  Diabetes screening. ? This is done by checking your blood sugar (glucose) after you have not eaten for a while (fasting). ? You may have this done every 1-3 years.  Mammogram. ? This may be done every 1-2 years. ? Talk with your health care provider about when you should start having regular mammograms. This may depend on whether you have a family history of breast cancer.  BRCA-related cancer screening. This may be done if you have a family history of breast, ovarian, tubal, or peritoneal cancers.  Pelvic exam and Pap test. ? This may be done every 3 years starting at age 10. ? Starting at age 11, this may be done every 5 years if you have a Pap test in combination with an HPV test. Other tests  STD (sexually transmitted disease) testing, if you are at risk.  Bone density scan. This is done to screen for osteoporosis. You may have this scan if you are at high risk for osteoporosis. Talk with your health care provider about your test results, treatment options, and if necessary, the need for more tests. Follow these instructions at home: Eating and drinking  Eat a diet that includes fresh fruits and vegetables, whole grains, lean protein, and low-fat dairy products.  Take vitamin and mineral supplements  as recommended by your health care provider.  Do not drink alcohol if: ? Your health care provider tells you not to drink. ? You are pregnant, may be pregnant, or are planning to become pregnant.  If you drink alcohol: ? Limit how much you have to 0-1 drink a day. ? Be aware of how much alcohol is in your drink. In the [place] one drink equals one 12 oz bottle of beer (355 mL), one 5 oz glass of  wine (148 mL), or one 1 oz glass of hard liquor (44 mL).   Lifestyle  Take daily care of your teeth and gums. Brush your teeth every morning and night with fluoride toothpaste. Floss one time each day.  Stay active. Exercise for at least 30 minutes 5 or more days each week.  Do not use any products that contain nicotine or tobacco, such as cigarettes, e-cigarettes, and chewing tobacco. If you need help quitting, ask your health care provider.  Do not use drugs.  If you are sexually active, practice safe sex. Use a condom or other form of protection to prevent STIs (sexually transmitted infections).  If you do not wish to become pregnant, use a form of birth control. If you plan to become pregnant, see your health care provider for a prepregnancy visit.  If told by your health care provider, take low-dose aspirin daily starting at age 50.  Find healthy ways to cope with stress, such as: ? Meditation, yoga, or listening to music. ? Journaling. ? Talking to a trusted person. ? Spending time with friends and family. Safety  Always wear your seat belt while driving or riding in a vehicle.  Do not drive: ? If you have been drinking alcohol. Do not ride with someone who has been drinking. ? When you are tired or distracted. ? While texting.  Wear a helmet and other protective equipment during sports activities.  If you have firearms in your house, make sure you follow all gun safety procedures. What's next?  Visit your health care provider once a year for an annual wellness visit.  Ask your health care provider how often you should have your eyes and teeth checked.  Stay up to date on all vaccines. This information is not intended to replace advice given to you by your health care provider. Make sure you discuss any questions you have with your health care provider. Document Revised: 03/25/2020 Document Reviewed: 03/02/2018 Elsevier Patient Education  2021 Elsevier Inc.  

## 2020-09-18 NOTE — Progress Notes (Signed)
Complete physical exam   Patient: Lisa Rivers   DOB: October 20, 1970   50 y.o. Female  MRN: [id number] Visit Date: 09/18/2020  Today's healthcare provider: Margaretann Loveless, PA-C   Chief Complaint  Patient presents with  . Annual Exam   Subjective    Lisa Rivers is a 50 y.o. female who presents today for a complete physical exam.  She reports consuming a general diet. Home exercise routine includes walking .5 hrs per day. She generally feels well. She reports sleeping well. She does not have additional problems to discuss today.  HPI   Pt would like to get tdap  Past Medical History:  Diagnosis Date  . ACL tear   . Adopted   . Arthritis   . Asthma   . Diabetes mellitus without complication (HCC)   . Headache    MIRGRAINES  . Hyperlipidemia    Past Surgical History:  Procedure Laterality Date  . APPENDECTOMY  12/2013  . BACK SURGERY  02/2019  . FOOT SURGERY  [phone]   BOTH FEET  . SHOULDER ARTHROSCOPY WITH BANKART REPAIR Left 11/02/2016   Procedure: SHOULDER ARTHROSCOPY WITH SUPERIOR LABRAL REPAIR , distal clavicle excision, and subacrominal decompression;  Surgeon: Juanell Fairly, MD;  Location: [facility] ORS;  Service: Orthopedics;  Laterality: Left;   Social History   Socioeconomic History  . Marital status: Married    Spouse name: Thayer Ohm  . Number of children: 0  . Years of education: 54  . Highest education level: Not on file  Occupational History  . Occupation: full time-city of Product/process development scientist: [employer]  Tobacco Use  . Smoking status: Never Smoker  . Smokeless tobacco: Never Used  Vaping Use  . Vaping Use: Never used  Substance and Sexual Activity  . Alcohol use: No    Alcohol/week: 0.0 standard drinks  . Drug use: No  . Sexual activity: Not on file  Other Topics Concern  . Not on file  Social History Narrative  . Not on file   Social Determinants of Health   Financial Resource Strain: Not on file  Food Insecurity:  Not on file  Transportation Needs: Not on file  Physical Activity: Not on file  Stress: Not on file  Social Connections: Not on file  Intimate Partner Violence: Not on file   Family Status  Relation Name Status  . Mother  Deceased at age 9  . Father  Deceased at age 105  . Brother  Alive  . Sister  Alive  . Brother  Deceased at age 53       shot himself  . Brother  Deceased       shot himself  . Brother  Deceased       complications from Hepatitis C  . Brother  Alive   Family History  Adopted: Yes  Problem Relation Age of Onset  . Coronary artery disease Mother   . Alcohol abuse Mother   . Heart disease Mother   . Diabetes Father   . Alcohol abuse Father   . Diabetes Brother    Allergies  Allergen Reactions  . Tape Other (See Comments)    ADHESIVE  CAUSED BLISTERS    Patient Care Team: Reine Just as PCP - General (Physician Assistant)   Medications: Outpatient Medications Prior to Visit  Medication Sig  . albuterol (VENTOLIN HFA) 108 (90 Base) MCG/ACT inhaler Inhale 2 puffs into the lungs every 6 (six) hours  as needed for wheezing or shortness of breath.   Marland Kitchen b complex vitamins capsule Take 1 capsule by mouth daily.  . Cholecalciferol (VITAMIN D PO) Take 2 tablets by mouth daily. 1500 iu per tablet  . cyclobenzaprine (FLEXERIL) 5 MG tablet Take 1 tablet (5 mg total) by mouth 3 (three) times daily as needed for muscle spasms.  . fluticasone (FLONASE) 50 MCG/ACT nasal spray SHAKE LIQUID AND USE 2 SPRAYS IN EACH NOSTRIL DAILY AS NEEDED  . hydrocortisone cream 0.5 % Apply topically.  . metFORMIN (GLUCOPHAGE) 1000 MG tablet Take 1 tablet (1,000 mg total) by mouth 2 (two) times daily.  . Multiple Vitamin (MULTIVITAMIN WITH MINERALS) TABS tablet Take 1 tablet by mouth daily.  . simvastatin (ZOCOR) 20 MG tablet Take 1 tablet (20 mg total) by mouth at bedtime.  . [DISCONTINUED] methylPREDNISolone (MEDROL) 4 MG TBPK tablet 6 day taper; take as directed on  package instructions   No facility-administered medications prior to visit.    Review of Systems  Constitutional: Negative.   HENT: Negative.   Eyes: Negative.   Respiratory: Negative.   Cardiovascular: Negative.   Gastrointestinal: Negative.   Endocrine: Negative.   Genitourinary: Negative.   Musculoskeletal: Positive for arthralgias.  Skin: Negative.   Allergic/Immunologic: Positive for environmental allergies.  Neurological: Positive for numbness and headaches.  Hematological: Negative.   Psychiatric/Behavioral: Negative.     Last CBC Lab Results  Component Value Date   WBC 13.8 (H) 03/23/2020   HGB 13.4 03/23/2020   HCT 39.8 03/23/2020   MCV 86.3 03/23/2020   MCH 29.1 03/23/2020   RDW 12.8 03/23/2020   PLT 402 (H) 03/23/2020   Last metabolic panel Lab Results  Component Value Date   GLUCOSE 307 (H) 03/23/2020   NA 135 03/23/2020   K 4.0 03/23/2020   CL 99 03/23/2020   CO2 24 03/23/2020   BUN 19 03/23/2020   CREATININE 1.11 (H) 03/23/2020   GFRNONAA 58 (L) 03/23/2020   GFRAA >60 03/23/2020   CALCIUM 9.2 03/23/2020   PROT 6.6 07/04/2019   ALBUMIN 4.2 07/04/2019   LABGLOB 2.4 07/04/2019   AGRATIO 1.8 07/04/2019   BILITOT 0.4 07/04/2019   ALKPHOS 68 07/04/2019   AST 24 07/04/2019   ALT 32 07/04/2019   ANIONGAP 12 03/23/2020      Objective    BP (!) 157/81 (BP Location: Right Arm, Patient Position: Sitting, Cuff Size: Normal)   Pulse 93   Temp 99 F (37.2 C) (Oral)   Ht  (1.575 m)   Wt 183 lb 3.2 oz (83.1 kg)   SpO2 100%   BMI 33.51 kg/m  BP Readings from Last 3 Encounters:  09/18/20 (!) 157/81  08/15/20 122/70  03/23/20 (!) 144/82   Wt Readings from Last 3 Encounters:  09/18/20 183 lb 3.2 oz (83.1 kg)  08/15/20 180 lb 4.8 oz (81.8 kg)  03/23/20 173 lb (78.5 kg)      Physical Exam Vitals reviewed.  Constitutional:      General: She is not in acute distress.    Appearance: Normal appearance. She is well-developed. She is obese.  She is not ill-appearing or diaphoretic.  HENT:     Head: Normocephalic and atraumatic.     Right Ear: Tympanic membrane, ear canal and external ear normal.     Left Ear: Tympanic membrane, ear canal and external ear normal.     Mouth/Throat:     Pharynx: No oropharyngeal exudate.  Eyes:     General: No  scleral icterus.       Right eye: No discharge.        Left eye: No discharge.     Extraocular Movements: Extraocular movements intact.     Conjunctiva/sclera: Conjunctivae normal.     Pupils: Pupils are equal, round, and reactive to light.  Neck:     Thyroid: No thyromegaly.     Vascular: No carotid bruit or JVD.     Trachea: No tracheal deviation.  Cardiovascular:     Rate and Rhythm: Normal rate and regular rhythm.     Pulses: Normal pulses.     Heart sounds: Normal heart sounds. No murmur heard. No friction rub. No gallop.   Pulmonary:     Effort: Pulmonary effort is normal. No respiratory distress.     Breath sounds: Normal breath sounds. No wheezing or rales.  Chest:     Chest wall: No tenderness.  Abdominal:     General: Abdomen is flat. Bowel sounds are normal. There is no distension.     Palpations: Abdomen is soft. There is no mass.     Tenderness: There is no abdominal tenderness. There is no guarding or rebound.  Musculoskeletal:        General: No tenderness. Normal range of motion.     Cervical back: Normal range of motion and neck supple. No tenderness.     Right lower leg: No edema.     Left lower leg: No edema.  Lymphadenopathy:     Cervical: No cervical adenopathy.  Skin:    General: Skin is warm and dry.     Capillary Refill: Capillary refill takes less than 2 seconds.     Findings: No rash.  Neurological:     General: No focal deficit present.     Mental Status: She is alert and oriented to person, place, and time. Mental status is at baseline.     Motor: No weakness.     Gait: Gait normal.  Psychiatric:        Mood and Affect: Mood normal.         Behavior: Behavior normal.        Thought Content: Thought content normal.        Judgment: Judgment normal.     Last depression screening scores PHQ 2/9 Scores 09/18/2020 08/15/2020 08/30/2019  PHQ - 2 Score 0 0 0  PHQ- 9 Score 0 1 0   Last fall risk screening Fall Risk  09/18/2020  Falls in the past year? 0  Number falls in past yr: 0  Injury with Fall? 0  Risk for fall due to : -  Follow up -   Last Audit-C alcohol use screening Alcohol Use Disorder Test (AUDIT) 09/18/2020  1. How often do you have a drink containing alcohol? 1  2. How many drinks containing alcohol do you have on a typical day when you are drinking? 0  3. How often do you have six or more drinks on one occasion? 0  AUDIT-C Score 1  Alcohol Brief Interventions/Follow-up AUDIT Score <7 follow-up not indicated   A score of 3 or more in women, and 4 or more in men indicates increased risk for alcohol abuse, EXCEPT if all of the points are from question 1   Results for orders placed or performed in visit on 09/18/20  POCT UA - Microalbumin  Result Value Ref Range   Microalbumin Ur, POC 20 mg/L   Creatinine, POC     Albumin/Creatinine Ratio, Urine, POC  Assessment & Plan    Routine Health Maintenance and Physical Exam  Exercise Activities and Dietary recommendations Goals   None     Immunization History  Administered Date(s) Administered  . Tdap 08/20/2010    Health Maintenance  Topic Date Due  . Hepatitis C Screening  Never done  . PNEUMOCOCCAL POLYSACCHARIDE VACCINE AGE 20-64 HIGH RISK  Never done  . COVID-19 Vaccine (1) Never done  . HIV Screening  Never done  . HEMOGLOBIN A1C  01/02/2020  . INFLUENZA VACCINE  Never done  . TETANUS/TDAP  08/20/2020  . FOOT EXAM  08/29/2020  . OPHTHALMOLOGY EXAM  11/15/2020  . URINE MICROALBUMIN  09/18/2021  . MAMMOGRAM  06/06/2022  . PAP SMEAR-Modifier  08/29/2022  . Fecal DNA (Cologuard)  10/11/2022  . HPV VACCINES  Aged Out    Discussed health  benefits of physical activity, and encouraged her to engage in regular exercise appropriate for her age and condition.  1. Annual physical exam Normal physical exam today. Will check labs as below and f/u pending lab results. If labs are stable and WNL she will not need to have these rechecked for one year at her next annual physical exam. She is to call the office in the meantime if she has any acute issue, questions or concerns. - CBC w/Diff/Platelet - Comprehensive Metabolic Panel (CMET) - Lipid Panel With LDL/HDL Ratio - TSH - Vitamin D (25 hydroxy) - HgB A1c  2. Diabetes mellitus without complication (HCC) Urine microalbumin at 20. Stable. Diagnosis pulled for medication refill. Continue current medical treatment plan. Will check labs as below and f/u pending results. - POCT UA - Microalbumin - metFORMIN (GLUCOPHAGE) 1000 MG tablet; Take 1 tablet (1,000 mg total) by mouth 2 (two) times daily.  Dispense: 180 tablet; Refill: 3 - CBC w/Diff/Platelet - Comprehensive Metabolic Panel (CMET) - HgB A1c  3. Non-seasonal allergic rhinitis due to pollen Stable. Diagnosis pulled for medication refill. Continue current medical treatment plan. - fluticasone (FLONASE) 50 MCG/ACT nasal spray; SHAKE LIQUID AND USE 2 SPRAYS IN EACH NOSTRIL DAILY AS NEEDED  Dispense: 48 g; Refill: 5  4. Hypercholesterolemia without hypertriglyceridemia Stable. Diagnosis pulled for medication refill. Continue current medical treatment plan. Will check labs as below and f/u pending results. - simvastatin (ZOCOR) 20 MG tablet; Take 1 tablet (20 mg total) by mouth at bedtime.  Dispense: 90 tablet; Refill: 3 - CBC w/Diff/Platelet - Comprehensive Metabolic Panel (CMET) - Lipid Panel With LDL/HDL Ratio - TSH - HgB T2W  5. Acute bilateral low back pain with left-sided sciatica Stable. Diagnosis pulled for medication refill. Continue current medical treatment plan. - cyclobenzaprine (FLEXERIL) 5 MG tablet; Take 1 tablet  (5 mg total) by mouth 3 (three) times daily as needed for muscle spasms.  Dispense: 30 tablet; Refill: 5  6. Class 1 obesity due to excess calories with serious comorbidity and body mass index (BMI) of 33.0 to 33.9 in adult Counseled patient on healthy lifestyle modifications including dieting and exercise.  Will check labs as below and f/u pending results. - Comprehensive Metabolic Panel (CMET) - Lipid Panel With LDL/HDL Ratio - TSH - HgB P8K  7. Avitaminosis D H/O this and postmenopausal. Will check labs as below and f/u pending results. - CBC w/Diff/Platelet - Vitamin D (25 hydroxy)  8. Need for tetanus booster Vaccine given to patient without complications. Patient sat for 15 minutes after administration and was tolerated well without adverse effects. - Tdap vaccine greater than or equal to 7yo IM  No follow-ups on file.     Delmer IslamI, Salih Williamson M Aldrin Engelhard, PA-C, have reviewed all documentation for this visit. The documentation on 09/23/20 for the exam, diagnosis, procedures, and orders are all accurate and complete.   Reine JustJennifer M Catheryne Deford, PA-C  [facility] [phone] (phone) [phone] (fax)  [facility]

## 2020-09-23 ENCOUNTER — Encounter: Payer: Self-pay | Admitting: Physician Assistant

## 2020-09-28 ENCOUNTER — Other Ambulatory Visit: Payer: Self-pay | Admitting: Physician Assistant

## 2020-09-28 DIAGNOSIS — E119 Type 2 diabetes mellitus without complications: Secondary | ICD-10-CM

## 2020-09-28 NOTE — Telephone Encounter (Signed)
Medication last ordered 09/18/20 with 3 refills. Refill request is not needed at this time-refused.

## 2020-10-03 ENCOUNTER — Ambulatory Visit: Payer: Self-pay

## 2020-10-03 LAB — COMPREHENSIVE METABOLIC PANEL
ALT: 20 IU/L (ref 0–32)
AST: 17 IU/L (ref 0–40)
Albumin/Globulin Ratio: 2.7 — ABNORMAL HIGH (ref 1.2–2.2)
Albumin: 4.8 g/dL (ref 3.8–4.9)
Alkaline Phosphatase: 54 IU/L (ref 44–121)
BUN/Creatinine Ratio: 19 (ref 9–23)
BUN: 14 mg/dL (ref 6–24)
Bilirubin Total: 0.4 mg/dL (ref 0.0–1.2)
CO2: 21 mmol/L (ref 20–29)
Calcium: 9.6 mg/dL (ref 8.7–10.2)
Chloride: 99 mmol/L (ref 96–106)
Creatinine, Ser: 0.75 mg/dL (ref 0.57–1.00)
Globulin, Total: 1.8 g/dL (ref 1.5–4.5)
Glucose: 146 mg/dL — ABNORMAL HIGH (ref 65–99)
Potassium: 4.3 mmol/L (ref 3.5–5.2)
Sodium: 138 mmol/L (ref 134–144)
Total Protein: 6.6 g/dL (ref 6.0–8.5)
eGFR: 96 mL/min/{1.73_m2} (ref >59)

## 2020-10-03 LAB — CBC WITH DIFFERENTIAL/PLATELET
Basophils Absolute: 0.1 10*3/uL (ref 0.0–0.2)
Basos: 1 %
EOS (ABSOLUTE): 0.2 10*3/uL (ref 0.0–0.4)
Eos: 1 %
Hematocrit: 41 % (ref 34.0–46.6)
Hemoglobin: 13.4 g/dL (ref 11.1–15.9)
Immature Grans (Abs): 0.1 10*3/uL (ref 0.0–0.1)
Immature Granulocytes: 1 %
Lymphocytes Absolute: 3.4 10*3/uL — ABNORMAL HIGH (ref 0.7–3.1)
Lymphs: 27 %
MCH: 28.6 pg (ref 26.6–33.0)
MCHC: 32.7 g/dL (ref 31.5–35.7)
MCV: 88 fL (ref 79–97)
Monocytes Absolute: 0.7 10*3/uL (ref 0.1–0.9)
Monocytes: 5 %
Neutrophils Absolute: 8.2 10*3/uL — ABNORMAL HIGH (ref 1.4–7.0)
Neutrophils: 65 %
Platelets: 449 10*3/uL (ref 150–450)
RBC: 4.68 x10E6/uL (ref 3.77–5.28)
RDW: 12.8 % (ref 11.7–15.4)
WBC: 12.6 10*3/uL — ABNORMAL HIGH (ref 3.4–10.8)

## 2020-10-03 LAB — LIPID PANEL WITH LDL/HDL RATIO
Cholesterol, Total: 161 mg/dL (ref 100–199)
HDL: 61 mg/dL (ref >39)
LDL Chol Calc (NIH): 75 mg/dL (ref 0–99)
LDL/HDL Ratio: 1.2 ratio (ref 0.0–3.2)
Triglycerides: 147 mg/dL (ref 0–149)
VLDL Cholesterol Cal: 25 mg/dL (ref 5–40)

## 2020-10-03 LAB — TSH: TSH: 2.18 u[IU]/mL (ref 0.450–4.500)

## 2020-10-03 LAB — HEMOGLOBIN A1C
Est. average glucose Bld gHb Est-mCnc: 160 mg/dL
Hgb A1c MFr Bld: 7.2 % — ABNORMAL HIGH (ref 4.8–5.6)

## 2020-10-03 LAB — VITAMIN D 25 HYDROXY (VIT D DEFICIENCY, FRACTURES): Vit D, 25-Hydroxy: 48.6 ng/mL (ref 30.0–100.0)

## 2020-10-03 NOTE — Telephone Encounter (Signed)
Pt. Reports she pulled a tick off her left inner thigh Wednesday and now area is achy and dime-sized swollen, red area. Concerned because she has had [facility] Spotted fever in the past. No availability in the practice today. Pt. Will go to UC or do My Chart tele health visit today.  Reason for Disposition . [1] Red or very tender (to touch) area AND [2] started over 24 hours after the bite  Answer Assessment - Initial Assessment Questions 1. TYPE of TICK: "Is it a wood tick or a deer tick?" If unsure, ask: "What size was the tick?" "Did it look more like a watermelon seed or a poppy seed?"      Poppy seed 2. LOCATION: "Where is the tick bite located?"      Left leg - inner 3. ONSET: "How long do you think the tick was attached before you removed it?" (Hours or days)      No 4. TETANUS: "When was the last tetanus booster?"      Unsure 5. PREGNANCY: "Is there any chance you are pregnant?" "When was your last menstrual period?"     No  Protocols used: TICK BITE-A-AH

## 2020-10-14 ENCOUNTER — Other Ambulatory Visit: Payer: Self-pay | Admitting: Physician Assistant

## 2020-10-14 DIAGNOSIS — E7800 Pure hypercholesterolemia, unspecified: Secondary | ICD-10-CM

## 2020-11-19 LAB — HM DIABETES EYE EXAM

## 2020-11-25 ENCOUNTER — Encounter: Payer: Self-pay | Admitting: Family Medicine

## 2021-03-05 DIAGNOSIS — M1711 Unilateral primary osteoarthritis, right knee: Secondary | ICD-10-CM | POA: Insufficient documentation

## 2021-03-10 DIAGNOSIS — Z96651 Presence of right artificial knee joint: Secondary | ICD-10-CM | POA: Insufficient documentation

## 2021-06-15 ENCOUNTER — Other Ambulatory Visit: Payer: Self-pay | Admitting: Physician Assistant

## 2021-06-15 DIAGNOSIS — M5442 Lumbago with sciatica, left side: Secondary | ICD-10-CM

## 2021-06-15 NOTE — Telephone Encounter (Signed)
LOV: 09/18/2020  NOV: None  Last Refill:  09/18/2020 #30 5 Refills  Thanks,   -Vernona Rieger

## 2021-06-26 ENCOUNTER — Encounter: Payer: Self-pay | Admitting: Physician Assistant

## 2021-06-26 ENCOUNTER — Ambulatory Visit (INDEPENDENT_AMBULATORY_CARE_PROVIDER_SITE_OTHER): Payer: 59 | Admitting: Physician Assistant

## 2021-06-26 ENCOUNTER — Other Ambulatory Visit: Payer: Self-pay

## 2021-06-26 ENCOUNTER — Telehealth: Payer: Self-pay | Admitting: Family Medicine

## 2021-06-26 VITALS — BP 135/81 | HR 96 | Temp 98.5°F | Resp 18 | Wt 180.0 lb

## 2021-06-26 DIAGNOSIS — R051 Acute cough: Secondary | ICD-10-CM | POA: Diagnosis not present

## 2021-06-26 DIAGNOSIS — J069 Acute upper respiratory infection, unspecified: Secondary | ICD-10-CM

## 2021-06-26 LAB — POCT INFLUENZA A/B
Influenza A, POC: NEGATIVE
Influenza B, POC: NEGATIVE

## 2021-06-26 MED ORDER — AMOXICILLIN 875 MG PO TABS: 875.0000 mg | ORAL_TABLET | Freq: Two times a day (BID) | ORAL | 0 refills | Status: AC

## 2021-06-26 MED ORDER — ALBUTEROL SULFATE HFA 108 (90 BASE) MCG/ACT IN AERS: 2.0000 | INHALATION_SPRAY | Freq: Four times a day (QID) | RESPIRATORY_TRACT | 2 refills | Status: AC | PRN

## 2021-06-26 MED ORDER — ALBUTEROL SULFATE HFA 108 (90 BASE) MCG/ACT IN AERS: 2.0000 | INHALATION_SPRAY | Freq: Four times a day (QID) | RESPIRATORY_TRACT | 2 refills | Status: DC | PRN

## 2021-06-26 MED ORDER — AMOXICILLIN 875 MG PO TABS: 875.0000 mg | ORAL_TABLET | Freq: Two times a day (BID) | ORAL | 0 refills | Status: DC

## 2021-06-26 NOTE — Progress Notes (Signed)
Established patient visit   Patient: Lisa Rivers   DOB: [birth year]   50 y.o. Female  MRN: [id number] Visit Date: 06/26/2021  Today's healthcare provider: Providence Crosby, PA-C   Chief Complaint  Patient presents with   Cough   Subjective    Cough This is a new problem. Episode onset: 2 weeks ago. The problem has been gradually worsening. Associated symptoms include chills, ear pain, a fever (up to 100), headaches, postnasal drip, rhinorrhea and a sore throat. Pertinent negatives include no chest pain, myalgias, shortness of breath or wheezing. Treatments tried: Tylenol and DayQuil. The treatment provided mild relief.   Patient took an at home COVID test 3 days ago and the result was negative.   States symptoms have been ongoing for 2 weeks without improvement and recently ears have started hurting   Medications: Outpatient Medications Prior to Visit  Medication Sig   b complex vitamins capsule Take 1 capsule by mouth daily.   Cholecalciferol (VITAMIN D PO) Take 2 tablets by mouth daily. 1500 iu per tablet   cyclobenzaprine (FLEXERIL) 5 MG tablet TAKE 1 TABLET(5 MG) BY MOUTH THREE TIMES DAILY AS NEEDED FOR MUSCLE SPASMS   fluticasone (FLONASE) 50 MCG/ACT nasal spray SHAKE LIQUID AND USE 2 SPRAYS IN EACH NOSTRIL DAILY AS NEEDED   hydrocortisone cream 0.5 % Apply topically.   metFORMIN (GLUCOPHAGE) 1000 MG tablet Take 1 tablet (1,000 mg total) by mouth 2 (two) times daily.   Multiple Vitamin (MULTIVITAMIN WITH MINERALS) TABS tablet Take 1 tablet by mouth daily.   simvastatin (ZOCOR) 20 MG tablet TAKE 1 TABLET(20 MG) BY MOUTH AT BEDTIME   [DISCONTINUED] albuterol (VENTOLIN HFA) 108 (90 Base) MCG/ACT inhaler Inhale 2 puffs into the lungs every 6 (six) hours as needed for wheezing or shortness of breath.    No facility-administered medications prior to visit.    Review of Systems  Constitutional:  Positive for chills, diaphoresis, fatigue and fever (up to 100). Negative  for appetite change.  HENT:  Positive for congestion, ear pain, postnasal drip, rhinorrhea, sinus pressure, sinus pain, sneezing and sore throat.   Eyes:  Negative for visual disturbance.  Respiratory:  Positive for cough (productive with yellow/ green sputum). Negative for chest tightness, shortness of breath and wheezing.   Cardiovascular:  Negative for chest pain and palpitations.  Gastrointestinal:  Negative for abdominal pain, diarrhea, nausea and vomiting.  Musculoskeletal:  Negative for myalgias.  Neurological:  Positive for headaches. Negative for dizziness and weakness.      Objective    BP 135/81 (BP Location: Left Arm, Patient Position: Sitting, Cuff Size: Large)    Pulse 96    Temp 98.5 F (36.9 C) (Oral)    Resp 18    Wt 180 lb (81.6 kg)    LMP  (Within Months)    SpO2 99% Comment: room air   BMI 32.92 kg/m  {Show previous vital signs (optional):23777}  Physical Exam Constitutional:      Appearance: Normal appearance. She is well-developed and overweight.  HENT:     Head: Normocephalic and atraumatic.     Right Ear: Hearing, tympanic membrane and ear canal normal.     Left Ear: Hearing, tympanic membrane and ear canal normal.     Nose:     Right Turbinates: Enlarged. Not swollen.     Left Turbinates: Enlarged. Not swollen.     Mouth/Throat:     Mouth: Mucous membranes are moist.     Pharynx: Uvula  midline. No pharyngeal swelling or posterior oropharyngeal erythema.     Tonsils: No tonsillar exudate or tonsillar abscesses.  Cardiovascular:     Rate and Rhythm: Normal rate and regular rhythm.     Pulses: Normal pulses.     Heart sounds: Normal heart sounds.  Pulmonary:     Effort: Pulmonary effort is normal.     Breath sounds: Normal breath sounds. No transmitted upper airway sounds. No decreased breath sounds, wheezing, rhonchi or rales.     Comments: Negative egophony globally Abdominal:     General: Bowel sounds are normal.  Lymphadenopathy:     Head:      Right side of head: Submandibular adenopathy present. No submental adenopathy.     Left side of head: Submandibular adenopathy present. No submental adenopathy.  Neurological:     Mental Status: She is alert.  Psychiatric:        Behavior: Behavior is cooperative.     Results for orders placed or performed in visit on 06/26/21  POCT Influenza A/B  Result Value Ref Range   Influenza A, POC Negative Negative   Influenza B, POC Negative Negative    Assessment & Plan     Problem List Items Addressed This Visit   None Visit Diagnoses     Acute cough    -  Primary   Relevant Orders   POCT Influenza A/B (Completed)   Upper respiratory tract infection, unspecified type       Relevant Medications   amoxicillin (AMOXIL) 875 MG tablet   albuterol (VENTOLIN HFA) 108 (90 Base) MCG/ACT inhaler       1. Acute cough Acute and likely secondary to URI symptoms and potential asthma flare.  Recommend using albuterol inhaler PRN Q6hrs for resolution   2. Upper respiratory tract infection, unspecified type Acute and ongoing without improvement for two weeks per patient Associated symptoms include- cough, congestion, ear pain, rhinorrhea, mild fever, headaches. Due to lack of improvement over 2 weeks and patient's hx of diabetes/obesity, negative home COVID test and negative influenza test, I am initiating oral ABX therapy with Amoxicillin 875mg  PO BID for 5 days.  Lack of improvement following abx course should be followed up for rule out of pneumonia or more serious sinus infections    No follow-ups on file.         Almon Register, PA-C  [employer] [employer] [phone] (phone) [phone] (fax)  [place]

## 2021-06-26 NOTE — Telephone Encounter (Signed)
Medication: Pts husband is calling to ask can this medication could be sent to the below pharmacy since the power is out at the pharmacy that it was sent to  albuterol (VENTOLIN HFA) 108 (90 Base) MCG/ACT inhaler [[id number]]  amoxicillin (AMOXIL) 875 MG tablet [[id number]]   Has the patient contacted their pharmacy? NO the pharmacy is closed due the power being out. (Agent: If no, request that the patient contact the pharmacy for the refill. If patient does not wish to contact the pharmacy document the reason why and proceed with request.) (Agent: If yes, when and what did the pharmacy advise?)  Preferred Pharmacy (with phone number or street name): [facility] [address] [place] [address] [phone] Has the patient been seen for an appointment in the last year OR does the patient have an upcoming appointment? YES 06/26/21  Agent: Please be advised that RX refills may take up to 3 business days. We ask that you follow-up with your pharmacy.

## 2021-06-26 NOTE — Patient Instructions (Addendum)
Based on your symptoms it appears you may have a bacterial upper respiratory infection  I am sending in a prescription for Amoxicillin 875mg  to be taken by mouth twice per day for 5 days to help clear this infection  Use your albuterol inhaler every 6 hours as needed to help with breathing and coughing.   Continue with Over the counter medications for symptom relief as needed  If you are not improving or symptoms worsen please follow up in the office If you experience marked trouble breathing, changes to vision, dizziness, increased fever, swelling around your eyes and nose please go to the ER

## 2021-07-07 ENCOUNTER — Ambulatory Visit: Payer: Self-pay | Admitting: *Deleted

## 2021-07-07 ENCOUNTER — Telehealth: Payer: 59 | Admitting: Family Medicine

## 2021-07-07 DIAGNOSIS — R059 Cough, unspecified: Secondary | ICD-10-CM

## 2021-07-07 NOTE — Progress Notes (Signed)
[place] - Needs to be seen by PCP or in person at [facility] for care of on going illness not responding to antibiotics.  Additional needs a return to work note for [place].

## 2021-07-07 NOTE — Telephone Encounter (Signed)
°  Chief Complaint: Needs a note for work.   Covid positive.   Seen on 06/26/2021. Symptoms: headache, arms and legs hurting, chills and sweating. Frequency:  Pertinent Negatives: Patient denies shortness of breath. Disposition: [] ED /[] Urgent Care (no appt availability in office) / [] Appointment(In office/virtual)/ [x]  [facility]/ [] Home Care/ [] Refused Recommended Disposition /[] [place] [facility]/ []  Follow-up with PCP Additional Notes: Pt is going to do a MyChart virtual visit.

## 2021-07-07 NOTE — Telephone Encounter (Signed)
Reason for Disposition  [1] HIGH RISK for severe COVID complications (e.g., weak immune system, age > 64 years, obesity with BMI > 25, pregnant, chronic lung disease or other chronic medical condition) AND [2] COVID symptoms (e.g., cough, fever)  (Exceptions: Already seen by PCP and no new or worsening symptoms.)    Doing a MyChart video visit  Answer Assessment - Initial Assessment Questions 1. COVID-19 DIAGNOSIS: "Who made your COVID-19 diagnosis?" "Was it confirmed by a positive lab test or self-test?" If not diagnosed by a doctor (or NP/PA), ask "Are there lots of cases (community spread) where you live?" Note: See public health department website, if unsure.     I returned pt's call.   I was positive on Christmas Eve for Covid.  I was given antibiotic.    2. COVID-19 EXPOSURE: "Was there any known exposure to COVID before the symptoms began?" CDC Definition of close contact: within 6 feet (2 meters) for a total of 15 minutes or more over a 24-hour period.      Co worker 3. ONSET: "When did the COVID-19 symptoms start?"      I need a note for work.    I have pains in my legs and arms.    I'm using Tylenol but it's not helping.   I use Biofreeze.   I'm sweating while just sitting still.   Then having chills.    4. WORST SYMPTOM: "What is your worst symptom?" (e.g., cough, fever, shortness of breath, muscle aches)     *No Answer* 5. COUGH: "Do you have a cough?" If Yes, ask: "How bad is the cough?"       Yes but it's better.   Coughing up a little but not much.    The headache is better maybe from the Tylenol.   6. FEVER: "Do you have a fever?" If Yes, ask: "What is your temperature, how was it measured, and when did it start?"     Fever 99 with Tylenol.   7. RESPIRATORY STATUS: "Describe your breathing?" (e.g., shortness of breath, wheezing, unable to speak)      No shortness of breath.   I've been using my albuterol inhaler.   I was out of work all last week.     8. BETTER-SAME-WORSE: "Are  you getting better, staying the same or getting worse compared to yesterday?"  If getting worse, ask, "In what way?"     *No Answer* 9. HIGH RISK DISEASE: "Do you have any chronic medical problems?" (e.g., asthma, heart or lung disease, weak immune system, obesity, etc.)     Diabetes 10. VACCINE: "Have you had the COVID-19 vaccine?" If Yes, ask: "Which one, how many shots, when did you get it?"       *No Answer* 11. BOOSTER: "Have you received your COVID-19 booster?" If Yes, ask: "Which one and when did you get it?"       *No Answer* 12. PREGNANCY: "Is there any chance you are pregnant?" "When was your last menstrual period?"       *No Answer* 13. OTHER SYMPTOMS: "Do you have any other symptoms?"  (e.g., chills, fatigue, headache, loss of smell or taste, muscle pain, sore throat)       Headache, arms and legs hurting, chills and sweating, productive cough. 14. O2 SATURATION MONITOR:  "Do you use an oxygen saturation monitor (pulse oximeter) at home?" If Yes, ask "What is your reading (oxygen level) today?" "What is your usual oxygen saturation reading?" (e.g., 95%)  Not asked  Protocols used: Coronavirus (COVID-19) Diagnosed or Suspected-A-AH

## 2021-07-17 ENCOUNTER — Other Ambulatory Visit: Payer: Self-pay

## 2021-07-17 ENCOUNTER — Encounter: Payer: Self-pay | Admitting: Physician Assistant

## 2021-07-17 ENCOUNTER — Ambulatory Visit: Payer: 59 | Admitting: Physician Assistant

## 2021-07-17 VITALS — BP 131/82 | HR 103 | Temp 98.8°F | Resp 16 | Ht 62.0 in | Wt 184.0 lb

## 2021-07-17 DIAGNOSIS — L720 Epidermal cyst: Secondary | ICD-10-CM | POA: Diagnosis not present

## 2021-07-17 NOTE — Patient Instructions (Addendum)
°  It was nice to see you and I appreciate the opportunity to be involved in your care   Based on your exam I think you have an epidermoid cyst which can be managed according to the information included   If it becomes inflamed, red, begins to drain, feels excessively warm to the touch please make a follow up as we may need to drain it   If it is still lingering after 3-5 weeks let us know and we can schedule an ultrasound of the area to see if it is a cyst or lymph node

## 2021-07-17 NOTE — Progress Notes (Signed)
Established patient visit  I,April Miller,acting as a scribe for [employer], PA-C.,have documented all relevant documentation on the behalf of [place], PA-C,as directed by  Junie Panning E Amie Cowens, PA-C while in the presence of Deren Degrazia E Marrio Scribner, PA-C.    Patient: Lisa Rivers   DOB: 1971-01-03   51 y.o. Female  MRN: [id number] Visit Date: 07/17/2021  Today's healthcare provider: Dani Gobble Burnis Halling, PA-C  Introduced myself to the patient as a Journalist, newspaper and provided education on APPs in clinical practice.    Chief Complaint  Patient presents with   Mass    Right Thigh   Subjective    HPI HPI     Mass    Additional comments: Right Thigh      Last edited by Julieta Bellini, CMA on 07/17/2021  1:17 PM.      Patient is here concerning a lump she discovered on her right upper thigh 3 days ago. Patient states lump is sensitive to the touch. Patient states lump is red occasionally. Lump is the size of a quarter.    Denies recent injury to area.     Medications: Outpatient Medications Prior to Visit  Medication Sig   albuterol (VENTOLIN HFA) 108 (90 Base) MCG/ACT inhaler Inhale 2 puffs into the lungs every 6 (six) hours as needed for wheezing or shortness of breath.   amoxicillin-clavulanate (AUGMENTIN) 875-125 MG tablet Take by mouth.   b complex vitamins capsule Take 1 capsule by mouth daily.   Cholecalciferol (VITAMIN D PO) Take 2 tablets by mouth daily. 1500 iu per tablet   cyclobenzaprine (FLEXERIL) 5 MG tablet TAKE 1 TABLET(5 MG) BY MOUTH THREE TIMES DAILY AS NEEDED FOR MUSCLE SPASMS   fluticasone (FLONASE) 50 MCG/ACT nasal spray SHAKE LIQUID AND USE 2 SPRAYS IN EACH NOSTRIL DAILY AS NEEDED   hydrocortisone cream 0.5 % Apply topically.   metFORMIN (GLUCOPHAGE) 1000 MG tablet Take 1 tablet (1,000 mg total) by mouth 2 (two) times daily.   Multiple Vitamin (MULTIVITAMIN WITH MINERALS) TABS tablet Take 1 tablet by mouth daily.   promethazine-dextromethorphan (PROMETHAZINE-DM) 6.25-15  MG/5ML syrup Take by mouth.   simvastatin (ZOCOR) 20 MG tablet TAKE 1 TABLET(20 MG) BY MOUTH AT BEDTIME   No facility-administered medications prior to visit.    Review of Systems  Skin:        CC: lump on right upper thigh, touch tenderness, redness       Objective    BP 131/82 (BP Location: Right Arm, Patient Position: Sitting, Cuff Size: Large)    Pulse (!) 103    Temp 98.8 F (37.1 C) (Temporal)    Resp 16    Ht 5\' 2"  (1.575 m)    Wt 184 lb (83.5 kg)    SpO2 96%    BMI 33.65 kg/m  {Show previous vital signs (optional):23777}  Physical Exam Lymphadenopathy:     Lower Body: No right inguinal adenopathy. No left inguinal adenopathy.  Skin:    General: Skin is warm and dry.          Comments: No surface erythema, warmth, induration, or skin injury in area indicated by pt No evidence of connected lymphadenopathy in surrounding tissue       No results found for any visits on 07/17/21.  Assessment & Plan     1. Epidermoid cyst of skin  Acute, tender to palpation, non-inflamed 2cm x 2cm mass palpated on ventral right thigh.  Suspected epidermoid cyst at this  time but without [place] unable to rule out enlarged lymph node  Recommend monitoring at this time- provided information on epidermoid cysts with return precautions If not resolving in 3-5 weeks, recommend [place] of area to determine character.    No follow-ups on file.     The entirety of the information documented in the History of Present Illness, Review of Systems and Physical Exam were personally obtained by me. Portions of this information were initially documented by the CMA and reviewed by me for thoroughness and accuracy.   Dani Gobble Kyna Blahnik, PA-C   Almon Register, PA-C  [employer] [employer] [phone] (phone) [phone] (fax)  [place]

## 2021-08-12 ENCOUNTER — Other Ambulatory Visit: Payer: Self-pay | Admitting: Physician Assistant

## 2021-08-12 DIAGNOSIS — E119 Type 2 diabetes mellitus without complications: Secondary | ICD-10-CM

## 2021-09-12 ENCOUNTER — Other Ambulatory Visit: Payer: Self-pay | Admitting: Family Medicine

## 2021-09-12 DIAGNOSIS — E119 Type 2 diabetes mellitus without complications: Secondary | ICD-10-CM

## 2021-09-14 NOTE — Telephone Encounter (Signed)
Requested medication (s) are due for refill today: yes ? ?Requested medication (s) are on the active medication list: yes ? ?Last refill:  08/26/21 #60/0 ? ?Future visit scheduled: yes 10/22/21 ? ?Notes to clinic: Unable to refill per protocol due to failed labs, no updated results. ? ?  ?Requested Prescriptions  ?Pending Prescriptions Disp Refills  ? metFORMIN (GLUCOPHAGE) 1000 MG tablet [Pharmacy Med Name: METFORMIN 1000MG TABLETS] 60 tablet 0  ?  Sig: TAKE 1 TABLET(1000 MG) BY MOUTH TWICE DAILY  ?  ? Endocrinology:  Diabetes - Biguanides Failed - 09/12/2021  8:09 AM  ?  ?  Failed - HBA1C is between 0 and 7.9 and within 180 days  ?  Hgb A1c MFr Bld  ?Date Value Ref Range Status  ?10/02/2020 7.2 (H) 4.8 - 5.6 % Final  ?  Comment:  ?           Prediabetes: 5.7 - 6.4 ?         Diabetes: >6.4 ?         Glycemic control for adults with diabetes: <7.0 ?  ?  ?  ?  ?  Failed - B12 Level in normal range and within 720 days  ?  No results found for: VITAMINB12  ?  ?  ?  Passed - Cr in normal range and within 360 days  ?  Creatinine  ?Date Value Ref Range Status  ?12/31/2013 1.08 0.60 - 1.30 mg/dL Final  ? ?Creatinine, Ser  ?Date Value Ref Range Status  ?10/02/2020 0.75 0.57 - 1.00 mg/dL Final  ?  ?  ?  ?  Passed - eGFR in normal range and within 360 days  ?  EGFR (African American)  ?Date Value Ref Range Status  ?12/31/2013 >60  Final  ? ?GFR calc Af Wyvonnia Lora  ?Date Value Ref Range Status  ?03/23/2020 >60 >60 mL/min Final  ? ?EGFR (Non-African Amer.)  ?Date Value Ref Range Status  ?12/31/2013 >60  Final  ?  Comment:  ?  eGFR values <84m/min/1.73 m2 may be an indication of chronic ?kidney disease (CKD). ?Calculated eGFR is useful in patients with stable renal function. ?The eGFR calculation will not be reliable in acutely ill patients ?when serum creatinine is changing rapidly. It is not useful in  ?patients on dialysis. The eGFR calculation may not be applicable ?to patients at the low and high extremes of body sizes,  pregnant ?women, and vegetarians. ?  ? ?GFR calc non Af Amer  ?Date Value Ref Range Status  ?03/23/2020 58 (L) >60 mL/min Final  ? ?eGFR  ?Date Value Ref Range Status  ?10/02/2020 96 >59 mL/min/1.73 Final  ?  ?  ?  ?  Passed - Valid encounter within last 6 months  ?  Recent Outpatient Visits   ? ?      ? 1 month ago Epidermoid cyst of skin  ? [place]  ? 2 months ago Acute cough  ? [place]  ? 12 months ago [place] physical exam  ? [facility] [facility] [facility] ? 1 year ago Lateral epicondylitis of right elbow  ? [facility] [place] [facility] ? 1 year ago Mild intermittent asthmatic bronchitis with acute exacerbation  ? [facility]  ? ?  ?  ?Future Appointments   ? ?        ? In 1 month  Gwyneth Sprout, FNP [facility]  ? ?  ? ?  ?  ?  Passed - CBC within normal limits and completed in the last 12 months  ?  WBC  ?Date Value Ref Range Status  ?10/02/2020 12.6 (H) 3.4 - 10.8 x10E3/uL Final  ?03/23/2020 13.8 (H) 4.0 - 10.5 K/uL Final  ? ?RBC  ?Date Value Ref Range Status  ?10/02/2020 4.68 3.77 - 5.28 x10E6/uL Final  ?03/23/2020 4.61 3.87 - 5.11 MIL/uL Final  ? ?Hemoglobin  ?Date Value Ref Range Status  ?10/02/2020 13.4 11.1 - 15.9 g/dL Final  ? ?Hematocrit  ?Date Value Ref Range Status  ?10/02/2020 41.0 34.0 - 46.6 % Final  ? ?MCHC  ?Date Value Ref Range Status  ?10/02/2020 32.7 31.5 - 35.7 g/dL Final  ?03/23/2020 33.7 30.0 - 36.0 g/dL Final  ? ?MCH  ?Date Value Ref Range Status  ?10/02/2020 28.6 26.6 - 33.0 pg Final  ?03/23/2020 29.1 26.0 - 34.0 pg Final  ? ?MCV  ?Date Value Ref Range Status  ?10/02/2020 88 79 - 97 fL Final  ?12/31/2013 89 80 - 100 fL Final  ? ?No results found for: PLTCOUNTKUC, LABPLAT, [place] ?RDW  ?Date Value Ref Range Status  ?10/02/2020 12.8 11.7 - 15.4 % Final  ?12/31/2013 12.7 11.5 - 14.5 % Final  ? ?  ?  ?   ? ?

## 2021-10-11 ENCOUNTER — Emergency Department: Payer: 59

## 2021-10-11 ENCOUNTER — Emergency Department
Admission: EM | Admit: 2021-10-11 | Discharge: 2021-10-11 | Disposition: A | Discharge status: Home / Self Care | Payer: 59 | Attending: Emergency Medicine | Admitting: Emergency Medicine

## 2021-10-11 ENCOUNTER — Other Ambulatory Visit: Payer: Self-pay

## 2021-10-11 DIAGNOSIS — N200 Calculus of kidney: Secondary | ICD-10-CM | POA: Diagnosis not present

## 2021-10-11 DIAGNOSIS — R109 Unspecified abdominal pain: Secondary | ICD-10-CM | POA: Diagnosis present

## 2021-10-11 LAB — BASIC METABOLIC PANEL
Anion gap: 10 (ref 5–15)
BUN: 21 mg/dL — ABNORMAL HIGH (ref 6–20)
CO2: 26 mmol/L (ref 22–32)
Calcium: 9.2 mg/dL (ref 8.9–10.3)
Chloride: 102 mmol/L (ref 98–111)
Creatinine, Ser: 1.42 mg/dL — ABNORMAL HIGH (ref 0.44–1.00)
GFR, Estimated: 45 mL/min — ABNORMAL LOW (ref >60)
Glucose, Bld: 159 mg/dL — ABNORMAL HIGH (ref 70–99)
Potassium: 3.9 mmol/L (ref 3.5–5.1)
Sodium: 138 mmol/L (ref 135–145)

## 2021-10-11 LAB — URINALYSIS, ROUTINE W REFLEX MICROSCOPIC
Bacteria, UA: NONE SEEN
Specific Gravity, Urine: 1.025 (ref 1.005–1.030)

## 2021-10-11 LAB — POC URINE PREG, ED: Preg Test, Ur: NEGATIVE

## 2021-10-11 LAB — CBC
HCT: 39.5 % (ref 36.0–46.0)
Hemoglobin: 12.6 g/dL (ref 12.0–15.0)
MCH: 28.8 pg (ref 26.0–34.0)
MCHC: 31.9 g/dL (ref 30.0–36.0)
MCV: 90.2 fL (ref 80.0–100.0)
Platelets: 367 10*3/uL (ref 150–400)
RBC: 4.38 MIL/uL (ref 3.87–5.11)
RDW: 12.4 % (ref 11.5–15.5)
WBC: 9.9 10*3/uL (ref 4.0–10.5)
nRBC: 0 % (ref 0.0–0.2)

## 2021-10-11 MED ORDER — CEPHALEXIN 500 MG PO CAPS: 500.0000 mg | ORAL_CAPSULE | Freq: Four times a day (QID) | ORAL | 0 refills | Status: AC

## 2021-10-11 MED ORDER — TAMSULOSIN HCL 0.4 MG PO CAPS: 0.4000 mg | ORAL_CAPSULE | Freq: Every day | ORAL | 0 refills | Status: DC

## 2021-10-11 MED ORDER — SODIUM CHLORIDE 0.9 % IV BOLUS
1000.0000 mL | Freq: Once | INTRAVENOUS | Status: AC
  Administered 2021-10-11: 1000 mL via INTRAVENOUS

## 2021-10-11 MED ORDER — ONDANSETRON HCL 4 MG/2ML IJ SOLN
4.0000 mg | Freq: Once | INTRAMUSCULAR | Status: AC
  Administered 2021-10-11: 4 mg via INTRAVENOUS
  Filled 2021-10-11: qty 2

## 2021-10-11 MED ORDER — OXYCODONE-ACETAMINOPHEN 5-325 MG PO TABS: 1.0000 | ORAL_TABLET | Freq: Four times a day (QID) | ORAL | 0 refills | Status: DC | PRN

## 2021-10-11 MED ORDER — SODIUM CHLORIDE 0.9 % IV SOLN
1.0000 g | Freq: Once | INTRAVENOUS | Status: AC
  Administered 2021-10-11: 1 g via INTRAVENOUS
  Filled 2021-10-11: qty 10

## 2021-10-11 MED ORDER — MORPHINE SULFATE (PF) 4 MG/ML IV SOLN
4.0000 mg | Freq: Once | INTRAVENOUS | Status: AC
  Administered 2021-10-11: 4 mg via INTRAVENOUS
  Filled 2021-10-11: qty 1

## 2021-10-11 MED ORDER — CEPHALEXIN 500 MG PO CAPS: 500.0000 mg | ORAL_CAPSULE | Freq: Four times a day (QID) | ORAL | 0 refills | Status: DC

## 2021-10-11 NOTE — ED Provider Notes (Signed)
? ?[facility] ?Provider Note ? ?Patient Contact: 3:13 PM (approximate) ? ? ?History  ? ?Flank Pain ? ? ?HPI ? ?Lisa Rivers is a 51 y.o. female who presents the emergency department complaining of flank pain and right-sided abdominal pain in the setting of a positive UTI.  Patient started to have UTI symptoms 4 days ago, called her doctor and was prescribed Macrobid and Pyridium for symptoms.  Patient started to have worsening pain and started to develop hematuria 2 days ago.  Primary care wanted her to go to [facility] for urinalysis however [facility] is closed for the holiday weekend.  As the pain started to worsen and she presented to the ED for evaluation.  She has had no fevers, chills, nausea, vomiting, diarrhea or constipation.  She does have a history of nephrolithiasis. ?  ? ? ?Physical Exam  ? ?Triage Vital Signs: ?ED Triage Vitals [10/11/21 1434]  ?Enc Vitals Group  ?   BP (!) 161/81  ?   Pulse Rate 85  ?   Resp 18  ?   Temp 97.7 ?F (36.5 ?C)  ?   Temp Source Oral  ?   SpO2 100 %  ?   Weight 178 lb (80.7 kg)  ?   Height 5\' 2"  (1.575 m)  ?   Head Circumference   ?   Peak Flow   ?   Pain Score 6  ?   Pain Loc   ?   Pain Edu?   ?   Excl. in [place]?   ? ? ?Most recent vital signs: ?Vitals:  ? 10/11/21 1434  ?BP: (!) 161/81  ?Pulse: 85  ?Resp: 18  ?Temp: 97.7 ?F (36.5 ?C)  ?SpO2: 100%  ? ? ? ?General: Alert and in no acute distress.  ?Cardiovascular:  Good peripheral perfusion ?Respiratory: Normal respiratory effort without tachypnea or retractions. Lungs CTAB. Good air entry to the bases with no decreased or absent breath sounds. ?Gastrointestinal: Bowel sounds ?4 quadrants.  Soft to palpation all quadrants.  No tenderness to the left side but patient is tender on the right lower quadrant.  Patient has no guarding or rigidity. No palpable masses. No distention.  Left-sided CVA tenderness. ?Musculoskeletal: Full range of motion to all extremities.  ?Neurologic:  No gross focal neurologic  deficits are appreciated.  ?Skin:   No rash noted ?Other: ? ? ?ED Results / Procedures / Treatments  ? ?Labs ?(all labs ordered are listed, but only abnormal results are displayed) ?Labs Reviewed  ?URINALYSIS, ROUTINE W REFLEX MICROSCOPIC - Abnormal; Notable for the following components:  ?    Result Value  ? Color, Urine ORANGE (*)   ? APPearance CLEAR (*)   ? Glucose, UA   (*)   ? Value: TEST NOT REPORTED DUE TO COLOR INTERFERENCE OF URINE PIGMENT  ? Hgb urine dipstick   (*)   ? Value: TEST NOT REPORTED DUE TO COLOR INTERFERENCE OF URINE PIGMENT  ? Bilirubin Urine   (*)   ? Value: TEST NOT REPORTED DUE TO COLOR INTERFERENCE OF URINE PIGMENT  ? Ketones, ur   (*)   ? Value: TEST NOT REPORTED DUE TO COLOR INTERFERENCE OF URINE PIGMENT  ? Protein, ur   (*)   ? Value: TEST NOT REPORTED DUE TO COLOR INTERFERENCE OF URINE PIGMENT  ? Nitrite   (*)   ? Value: TEST NOT REPORTED DUE TO COLOR INTERFERENCE OF URINE PIGMENT  ? Leukocytes,Ua   (*)   ? Value:  TEST NOT REPORTED DUE TO COLOR INTERFERENCE OF URINE PIGMENT  ? All other components within normal limits  ?BASIC METABOLIC PANEL - Abnormal; Notable for the following components:  ? Glucose, Bld 159 (*)   ? BUN 21 (*)   ? Creatinine, Ser 1.42 (*)   ? GFR, Estimated 45 (*)   ? All other components within normal limits  ?CBC  ?POC URINE PREG, ED  ? ? ? ?EKG ? ? ? ? ?RADIOLOGY ? ?I personally viewed and evaluated these images as part of my medical decision making, as well as reviewing the written report by the radiologist. ? ?ED Provider Interpretation: Nephrolithiasis measuring 3 mm seen at the right UVJ.  Mild right hydroureteronephrosis.  No perinephric stranding ? ?CT Renal Stone Study ? ?Result Date: 10/11/2021 ?CLINICAL DATA:  Flank pain on the right. EXAM: CT ABDOMEN AND PELVIS WITHOUT CONTRAST TECHNIQUE: Multidetector CT imaging of the abdomen and pelvis was performed following the standard protocol without IV contrast. RADIATION DOSE REDUCTION: This exam was performed  according to the departmental dose-optimization program which includes automated exposure control, adjustment of the mA and/or kV according to patient size and/or use of iterative reconstruction technique. COMPARISON:  03/23/2020 FINDINGS: Lower chest: 3 mm left lower lobe nodule on 28/4 is unchanged consistent with benign etiology. No followup recommended. Hepatobiliary: The liver shows diffusely decreased attenuation suggesting fat deposition. No suspicious focal abnormality in the liver on this study without intravenous contrast. There is no evidence for gallstones, gallbladder wall thickening, or pericholecystic fluid. No intrahepatic or extrahepatic biliary dilation. Pancreas: No focal mass lesion. No dilatation of the main duct. No intraparenchymal cyst. No peripancreatic edema. Spleen: No splenomegaly. No focal mass lesion. Adrenals/Urinary Tract: No adrenal nodule or mass. Mild fullness noted right intrarenal collecting system with mild right hydroureter. 3 mm stone identified at the right UVJ (axial image 86 of series 2). No stones are seen in the left kidney or ureter. No bladder stones. Stomach/Bowel: Stomach is unremarkable. No gastric wall thickening. No evidence of outlet obstruction. Duodenum is normally positioned as is the ligament of Treitz. No small bowel wall thickening. No small bowel dilatation. The terminal ileum is normal. Nonvisualization of the appendix is consistent with the reported history of appendectomy. No gross colonic mass. No colonic wall thickening. Vascular/Lymphatic: No abdominal aortic aneurysm. No abdominal aortic atherosclerotic calcification. There is no gastrohepatic or hepatoduodenal ligament lymphadenopathy. No retroperitoneal or mesenteric lymphadenopathy. No pelvic sidewall lymphadenopathy. Reproductive: The uterus is unremarkable.  There is no adnexal mass. Other: No intraperitoneal free fluid. Musculoskeletal: No worrisome lytic or sclerotic osseous abnormality.  IMPRESSION: 1. 3 mm right UVJ stone with mild right hydroureteronephrosis. No other urinary stone disease evident. 2. Hepatic steatosis. Electronically Signed   By: Misty Stanley M.D.   On: 10/11/2021 16:33   ? ?PROCEDURES: ? ?Critical Care performed: No ? ?Procedures ? ? ?MEDICATIONS ORDERED IN ED: ?Medications  ?sodium chloride 0.9 % bolus 1,000 mL (0 mLs Intravenous Stopped 10/11/21 1735)  ?ondansetron [facility]) injection 4 mg (4 mg Intravenous Given 10/11/21 1527)  ?morphine (PF) 4 MG/ML injection 4 mg (4 mg Intravenous Given 10/11/21 1527)  ?cefTRIAXone (ROCEPHIN) 1 g in sodium chloride 0.9 % 100 mL IVPB (0 g Intravenous Stopped 10/11/21 1735)  ? ? ? ?IMPRESSION / MDM / ASSESSMENT AND PLAN / ED COURSE  ?I reviewed the triage vital signs and the nursing notes. ?             ?               ? ?  Differential diagnosis includes, but is not limited to, UTI, pyelonephritis, nephrolithiasis ? ? ?Patient's diagnosis is consistent with nephrolithiasis.  Patient presented to the emergency department concerned that she had a worsening UTI.  Patient had a video conference with a provider and was diagnosed empirically based off of symptoms.  She has been taking nitrofurantoin without improvement of her symptoms.  Patient also has a history of nephrolithiasis, has hematuria and states that her pain feels more like a kidney stone.  Patient has been taking Pyridium/Azo that was previously prescribed and urinalysis is unable to assist [place] in our evaluation at this time due to the Azo.  Patient will have CT scan given her history of nephrolithiasis.  There is a stone at the right UVJ with mild hydroureteronephrosis.  There is no perinephric stranding.  Patient is afebrile, nontoxic-appearing.  No elevation of the white blood cell count.  I did personally prescribe Keflex for the patient to take as well as providing Rocephin here but do not feel that the patient warrants urgent surgical intervention or inpatient admission.  Follow-up with  urology if symptoms or not improving.  Any worsening of symptoms should prompt a return to the ED.Marland Kitchen  Patient is given ED precautions to return to the ED for any worsening or new symptoms. ? ? ? ?  ? ? ?FINAL CLIN

## 2021-10-11 NOTE — ED Notes (Signed)
Pt to ED for R flank pain since today, pain started in lower abdomen, saw [facility] this week who diagnosed UTI, pt is currently on abx. Did see blood in urine this past week. Pain is 5/10 sharp stabbing. Pt has been vomiting today when pain gets bad. ?

## 2021-10-11 NOTE — ED Triage Notes (Addendum)
Pt here with right side flank pain that started Wed. Pt saw [facility] and was prescribe abx for a UTI but the pain has become persistent and stronger. Pt states that it feels similar to a kidney stone that she has had in the past. Pt reports N/V but no diarrhea.  ?

## 2021-10-22 ENCOUNTER — Encounter: Payer: Self-pay | Admitting: Family Medicine

## 2021-10-22 ENCOUNTER — Ambulatory Visit (INDEPENDENT_AMBULATORY_CARE_PROVIDER_SITE_OTHER): Payer: 59 | Admitting: Family Medicine

## 2021-10-22 VITALS — BP 129/83 | HR 83 | Temp 98.8°F | Resp 14 | Wt 187.0 lb

## 2021-10-22 DIAGNOSIS — E785 Hyperlipidemia, unspecified: Secondary | ICD-10-CM | POA: Diagnosis not present

## 2021-10-22 DIAGNOSIS — E1169 Type 2 diabetes mellitus with other specified complication: Secondary | ICD-10-CM | POA: Diagnosis not present

## 2021-10-22 DIAGNOSIS — E1165 Type 2 diabetes mellitus with hyperglycemia: Secondary | ICD-10-CM | POA: Diagnosis not present

## 2021-10-22 MED ORDER — METFORMIN HCL 1000 MG PO TABS: 1000.0000 mg | ORAL_TABLET | Freq: Two times a day (BID) | ORAL | 1 refills | Status: DC

## 2021-10-22 MED ORDER — SIMVASTATIN 20 MG PO TABS: 20.0000 mg | ORAL_TABLET | Freq: Every day | ORAL | 1 refills | Status: DC

## 2021-10-22 NOTE — Progress Notes (Signed)
?  ? ? ?I,Roshena L Chambers,acting as a scribe for Gwyneth Sprout, FNP.,have documented all relevant documentation on the behalf of Gwyneth Sprout, FNP,as directed by  Gwyneth Sprout, FNP while in the presence of Gwyneth Sprout, FNP.  ? ?Established patient visit ? ? ?Patient: Lisa Rivers   DOB: 01-08-1971   51 y.o. Female  MRN: [id number] ?Visit Date: 10/22/2021 ? ?Today's healthcare provider: Gwyneth Sprout, FNP  ? ?Introduced to Designer, jewellery role and practice setting.  All questions answered.  Discussed provider/patient relationship and expectations. ? ? ?Chief Complaint  ?Patient presents with  ? Diabetes  ? ?Subjective  ?  ?HPI  ?Diabetes Mellitus Type II, Follow-up ? ?Lab Results  ?Component Value Date  ? HGBA1C 7.2 (H) 10/02/2020  ? HGBA1C 7.6 (H) 07/04/2019  ? HGBA1C 6.3 (H) 06/01/2017  ? ?Wt Readings from Last 3 Encounters:  ?10/22/21 187 lb (84.8 kg)  ?10/11/21 178 lb (80.7 kg)  ?07/17/21 184 lb (83.5 kg)  ? ?Last seen for diabetes 1  year  ago.  ?Management since then includes continuing same medication. ?She reports good compliance with treatment. ?She is not having side effects.  ?Symptoms: ?No fatigue No foot ulcerations  ?No appetite changes No nausea  ?No paresthesia of the feet  No polydipsia  ?No polyuria No visual disturbances   ?No vomiting   ? ? ?Home blood sugar records:  random 150-160 ? ?Episodes of hypoglycemia? No  ?  ?Current insulin regiment: none ?Most Recent Eye Exam: 11/19/2020 ?Current exercise: walking ?Current diet habits: well balanced ? ?Pertinent Labs: ?Lab Results  ?Component Value Date  ? CHOL 161 10/02/2020  ? HDL 61 10/02/2020  ? [place] 75 10/02/2020  ? TRIG 147 10/02/2020  ? CHOLHDL 3.1 07/04/2019  ? Lab Results  ?Component Value Date  ? NA 138 10/11/2021  ? K 3.9 10/11/2021  ? CREATININE 1.42 (H) 10/11/2021  ? GFRNONAA 45 (L) 10/11/2021  ? MICROALBUR 20 09/18/2020  ?   ? ?---------------------------------------------------------------------------------------------------  ? ?Medications: ?Outpatient Medications Prior to Visit  ?Medication Sig  ? albuterol (VENTOLIN HFA) 108 (90 Base) MCG/ACT inhaler Inhale 2 puffs into the lungs every 6 (six) hours as needed for wheezing or shortness of breath.  ? b complex vitamins capsule Take 1 capsule by mouth daily.  ? Cholecalciferol (VITAMIN D PO) Take 2 tablets by mouth daily. 1500 iu per tablet  ? cyclobenzaprine (FLEXERIL) 5 MG tablet TAKE 1 TABLET(5 MG) BY MOUTH THREE TIMES DAILY AS NEEDED FOR MUSCLE SPASMS  ? fluticasone (FLONASE) 50 MCG/ACT nasal spray SHAKE LIQUID AND USE 2 SPRAYS IN EACH NOSTRIL DAILY AS NEEDED  ? hydrocortisone cream 0.5 % Apply topically.  ? Multiple Vitamin (MULTIVITAMIN WITH MINERALS) TABS tablet Take 1 tablet by mouth daily.  ? [DISCONTINUED] metFORMIN (GLUCOPHAGE) 1000 MG tablet TAKE 1 TABLET(1000 MG) BY MOUTH TWICE DAILY  ? [DISCONTINUED] promethazine-dextromethorphan (PROMETHAZINE-DM) 6.25-15 MG/5ML syrup Take by mouth.  ? [DISCONTINUED] simvastatin (ZOCOR) 20 MG tablet TAKE 1 TABLET(20 MG) BY MOUTH AT BEDTIME  ? [DISCONTINUED] tamsulosin (FLOMAX) 0.4 MG CAPS capsule Take 1 capsule (0.4 mg total) by mouth daily.  ? [DISCONTINUED] oxyCODONE-acetaminophen (PERCOCET/ROXICET) 5-325 MG tablet Take 1 tablet by mouth every 6 (six) hours as needed for severe pain.  ? ?No facility-administered medications prior to visit.  ? ? ?Review of Systems  ?Constitutional:  Negative for appetite change, chills, fatigue and fever.  ?Respiratory:  Negative for chest tightness and shortness of breath.   ?Cardiovascular:  Negative for chest pain and palpitations.  ?Gastrointestinal:  Negative for abdominal pain, nausea and vomiting.  ?Neurological:  Negative for dizziness and weakness.  ? ? ?  Objective  ?  ?BP 129/83   Pulse 83   Temp 98.8 ?F (37.1 ?C) (Oral)   Resp 14   Wt 187 lb (84.8 kg)   LMP  (Within Months) Comment:  04/2021  SpO2 98% Comment: room air  BMI 34.20 kg/m?  ? ? ?Physical Exam ?Vitals and nursing note reviewed.  ?Constitutional:   ?   General: She is not in acute distress. ?   Appearance: Normal appearance. She is obese. She is not ill-appearing, toxic-appearing or diaphoretic.  ?HENT:  ?   Head: Normocephalic and atraumatic.  ?Cardiovascular:  ?   Rate and Rhythm: Normal rate and regular rhythm.  ?   Pulses: Normal pulses.  ?   Heart sounds: Normal heart sounds. No murmur heard. ?  No friction rub. No gallop.  ?   Comments: Trace pitting edema/+1 on BLE ?Pulmonary:  ?   Effort: Pulmonary effort is normal. No respiratory distress.  ?   Breath sounds: Normal breath sounds. No stridor. No wheezing, rhonchi or rales.  ?Chest:  ?   Chest wall: No tenderness.  ?Abdominal:  ?   General: Bowel sounds are normal.  ?   Palpations: Abdomen is soft.  ?Musculoskeletal:     ?   General: No swelling, tenderness, deformity or signs of injury. Normal range of motion.  ?   Right lower leg: 1+ Edema present.  ?   Left lower leg: 1+ Edema present.  ?Skin: ?   General: Skin is warm and dry.  ?   Capillary Refill: Capillary refill takes less than 2 seconds.  ?   Coloration: Skin is not jaundiced or pale.  ?   Findings: No bruising, erythema, lesion or rash.  ?Neurological:  ?   General: No focal deficit present.  ?   Mental Status: She is alert and oriented to person, place, and time. Mental status is at baseline.  ?   Cranial Nerves: No cranial nerve deficit.  ?   Sensory: No sensory deficit.  ?   Motor: No weakness.  ?   Coordination: Coordination normal.  ?Psychiatric:     ?   Mood and Affect: Mood normal.     ?   Behavior: Behavior normal.     ?   Thought Content: Thought content normal.     ?   Judgment: Judgment normal.  ?  ? ?No results found for any visits on 10/22/21. ? Assessment & Plan  ?  ? ?Problem List Items Addressed This Visit   ? ?  ? Endocrine  ? Diabetes (HCC) - Primary  ?  Chronic, previously stable ?Repeat A1c  today ?Will complete urine microalbumin ?Has eye appt scheduled for 12/2021 ?Denies foot exam with monofilament ?Denies use of low dose A1c for kidney protection ?On statin- Zocor 20 mg ?Continue metformin 1000 mg BID ?Continue to recommend balanced, lower carb meals. Smaller meal size, adding snacks. Choosing water as drink of choice and increasing purposeful exercise. ? ? ?  ?  ? Relevant Medications  ? metFORMIN (GLUCOPHAGE) 1000 MG tablet  ? simvastatin (ZOCOR) 20 MG tablet  ? Other Relevant Orders  ? Hemoglobin A1c  ? Urine Microalbumin w/creat. ratio  ? Hyperlipidemia associated with type 2 diabetes mellitus (HCC)  ?  Chronic, previously with LDL near goal of 70 ?Will repeat FLP  at CPE ?Continue use of zocor 20 mg ?We recommend diet low in saturated fat and regular exercise - 30 min at least 5 times per week ? ? ?  ?  ? Relevant Medications  ? metFORMIN (GLUCOPHAGE) 1000 MG tablet  ? simvastatin (ZOCOR) 20 MG tablet  ? ? ? ?Return in about 6 months (around 04/23/2022) for annual examination.  ?   ? ?I, Gwyneth Sprout, FNP, have reviewed all documentation for this visit. The documentation on 10/22/21 for the exam, diagnosis, procedures, and orders are all accurate and complete. ? ? ? ?Gwyneth Sprout, FNP  ?[place] ?[phone] (phone) ?[phone] (fax) ? ?[facility]  ?

## 2021-10-22 NOTE — Assessment & Plan Note (Signed)
Chronic, previously stable ?Repeat A1c today ?Will complete urine microalbumin ?Has eye appt scheduled for 12/2021 ?Denies foot exam with monofilament ?Denies use of low dose A1c for kidney protection ?On statin- Zocor 20 mg ?Continue metformin 1000 mg BID ?Continue to recommend balanced, lower carb meals. Smaller meal size, adding snacks. Choosing water as drink of choice and increasing purposeful exercise. ? ?

## 2021-10-22 NOTE — Assessment & Plan Note (Signed)
Chronic, previously with LDL near goal of 70 ?Will repeat FLP at CPE ?Continue use of zocor 20 mg ?We recommend diet low in saturated fat and regular exercise - 30 min at least 5 times per week ? ?

## 2021-10-23 LAB — HEMOGLOBIN A1C
Est. average glucose Bld gHb Est-mCnc: 174 mg/dL
Hgb A1c MFr Bld: 7.7 % — ABNORMAL HIGH (ref 4.8–5.6)

## 2021-10-23 LAB — MICROALBUMIN / CREATININE URINE RATIO
Creatinine, Urine: 120.2 mg/dL
Microalb/Creat Ratio: 6 mg/g creat (ref 0–29)
Microalbumin, Urine: 7.1 ug/mL

## 2021-11-20 LAB — HM DIABETES EYE EXAM

## 2021-11-23 ENCOUNTER — Other Ambulatory Visit: Payer: Self-pay | Admitting: Family Medicine

## 2021-11-23 DIAGNOSIS — E1169 Type 2 diabetes mellitus with other specified complication: Secondary | ICD-10-CM

## 2021-11-23 NOTE — Telephone Encounter (Signed)
Copied from CRM [id number]. Topic: Quick Communication - See Telephone Encounter >> Nov 23, 2021  9:15 AM Dimple Casey wrote: CRM for notification. See Telephone encounter for: 11/23/21.   Medication Refill - Medication: simvastatin (ZOCOR) 20 MG tablet   Has the patient contacted their pharmacy? Yes.   (Agent: If no, request that the patient contact the pharmacy for the refill. If patient does not wish to contact the pharmacy document the reason why and proceed with request.) (Agent: If yes, when and what did the pharmacy advise?)  Preferred Pharmacy (with phone number or street name): [id number] - [place], [place] - [address] AT [facility] OF [address] Has the patient been seen for an appointment in the last year OR does the patient have an upcoming appointment? Yes.    Agent: Please be advised that RX refills may take up to 3 business days. We ask that you follow-up with your pharmacy.

## 2021-11-24 NOTE — Telephone Encounter (Signed)
rx was sent to pharmacy on 10/22/21 #90/1.  Requested Prescriptions  Pending Prescriptions Disp Refills  . simvastatin (ZOCOR) 20 MG tablet 90 tablet 1    Sig: Take 1 tablet (20 mg total) by mouth daily at 6 PM.     Cardiovascular:  Antilipid - Statins Failed - 11/23/2021 10:26 AM      Failed - Lipid Panel in normal range within the last 12 months    Cholesterol, Total  Date Value Ref Range Status  10/02/2020 161 100 - 199 mg/dL Final   LDL Chol Calc (NIH)  Date Value Ref Range Status  10/02/2020 75 0 - 99 mg/dL Final   HDL  Date Value Ref Range Status  10/02/2020 61 >39 mg/dL Final   Triglycerides  Date Value Ref Range Status  10/02/2020 147 0 - 149 mg/dL Final         Passed - Patient is not pregnant      Passed - Valid encounter within last 12 months    Recent Outpatient Visits          1 month ago Type 2 diabetes mellitus with hyperglycemia, without long-term current use of insulin [facility])   [facility] Merita Norton T, FNP   4 months ago Epidermoid cyst of skin   [facility], Oswaldo Conroy, PA-C   5 months ago Acute cough   [facility], Oswaldo Conroy, PA-C   1 year ago Annual physical exam   [facility], Alessandra Bevels, [place]   1 year ago Lateral epicondylitis of right elbow   [facility], Alessandra Bevels, [place]      Future Appointments            In 4 months Suzie Portela, Daryl Eastern, FNP [facility], [facility]

## 2022-02-02 ENCOUNTER — Encounter: Payer: Self-pay | Admitting: Family Medicine

## 2022-02-02 ENCOUNTER — Ambulatory Visit: Payer: 59 | Admitting: Family Medicine

## 2022-02-02 DIAGNOSIS — L0232 Furuncle of buttock: Secondary | ICD-10-CM

## 2022-02-02 MED ORDER — DOXYCYCLINE HYCLATE 100 MG PO TABS: 100.0000 mg | ORAL_TABLET | Freq: Two times a day (BID) | ORAL | 0 refills | Status: AC

## 2022-02-02 NOTE — Assessment & Plan Note (Addendum)
Actively draining. Will provide doxycycline given surrounding induration/inflammation. Continue epsom baths and warm soaks. Encouraged adequate diabetic control. F/u next week for recheck.

## 2022-02-02 NOTE — Progress Notes (Signed)
   SUBJECTIVE:   CHIEF COMPLAINT / HPI:   Boil - has had boil on buttocks since last week.  - poked with need le last night with expression of discharge. Has improved some since then.  - has been using bactene and epsom soaks - denies fever, N/V - h/o diabetes, last A1c 7.7.  OBJECTIVE:   BP (!) 143/85 (BP Location: Left Arm, Patient Position: Sitting, Cuff Size: Large)   Pulse 97   Resp 16   Wt 184 lb 8 oz (83.7 kg)   BMI 33.75 kg/m   Gen: well appearing, in NAD Skin: ~1cm raised abscess with surrounding erythema, induration. Actively draining purulent material.    ASSESSMENT/PLAN:   Boil of buttock Actively draining. Will provide doxycycline given surrounding induration/inflammation. Continue epsom baths and warm soaks. Encouraged adequate diabetic control. F/u next week for recheck.      Caro Laroche, DO

## 2022-02-09 ENCOUNTER — Encounter: Payer: Self-pay | Admitting: Family Medicine

## 2022-02-09 ENCOUNTER — Ambulatory Visit: Payer: 59 | Admitting: Family Medicine

## 2022-02-09 VITALS — BP 132/84 | HR 88 | Temp 98.0°F | Resp 16 | Wt 183.0 lb

## 2022-02-09 DIAGNOSIS — L720 Epidermal cyst: Secondary | ICD-10-CM

## 2022-02-09 DIAGNOSIS — L0232 Furuncle of buttock: Secondary | ICD-10-CM

## 2022-02-09 MED ORDER — DOXYCYCLINE HYCLATE 100 MG PO TABS: 100.0000 mg | ORAL_TABLET | Freq: Two times a day (BID) | ORAL | 0 refills | Status: AC

## 2022-02-09 NOTE — Assessment & Plan Note (Signed)
Improved though still erythematous and indurated and no active drainage. Likely sebaceous cyst needing excision. Will repeat antibiotics, refer to Surgery for excision.

## 2022-02-09 NOTE — Progress Notes (Signed)
   SUBJECTIVE:   CHIEF COMPLAINT / HPI:   Boil - seen previously 8/1 for boil in L sided gluteal cleft, actively draining but with signs of surrounding soft tissue infection. Rx doxycycline x5 days. Encouraged adequate diabetic control.  - drained some but stopped draining after a few days. - still feels some pain, hardness in the area, especially when sitting.  - previously had about 18 years ago in same area, excised with good results.   OBJECTIVE:   BP 132/84 (BP Location: Left Arm, Patient Position: Sitting, Cuff Size: Large)   Pulse 88   Temp 98 F (36.7 C) (Oral)   Resp 16   Wt 183 lb (83 kg)   BMI 33.47 kg/m   Gen: well appearing, in NAD Skin: ~1cm indurated area in L gluteal cleft with surrounding erythema, improved from previous. No fluctuance or active drainage appreciated.  ASSESSMENT/PLAN:   Boil of buttock Improved though still erythematous and indurated and no active drainage. Likely sebaceous cyst needing excision. Will repeat antibiotics, refer to Surgery for excision.      Caro Laroche, DO

## 2022-02-11 ENCOUNTER — Other Ambulatory Visit: Payer: Self-pay

## 2022-02-11 ENCOUNTER — Ambulatory Visit: Payer: 59 | Admitting: Surgery

## 2022-02-11 ENCOUNTER — Encounter: Payer: Self-pay | Admitting: Surgery

## 2022-02-11 VITALS — BP 149/85 | HR 86 | Temp 98.3°F | Ht 62.0 in | Wt 182.0 lb

## 2022-02-11 DIAGNOSIS — L0591 Pilonidal cyst without abscess: Secondary | ICD-10-CM | POA: Diagnosis not present

## 2022-02-11 NOTE — Progress Notes (Signed)
Patient ID: Lisa Rivers, female   DOB: Feb 07, 1971, 51 y.o.   MRN: [id number]  Chief Complaint: Recurrent tailbone cyst  History of Present Illness Lisa Rivers is a 51 y.o. female with a history of I&D of an abscess in the sacral region as a teenager, never required packing or extensive wound care to point of resolution.  No issues until recently where inflammation began off-and-on over the last year.  Currently completing second course of antibiotics.  Has not had any recent drainage.  Denies fevers and chills.  Past Medical History Past Medical History:  Diagnosis Date   ACL tear    Adopted    Arthritis    Asthma    Diabetes mellitus without complication (HCC)    Headache    MIRGRAINES   Hyperlipidemia       Past Surgical History:  Procedure Laterality Date   APPENDECTOMY  12/2013   BACK SURGERY  02/2019   FOOT SURGERY  [id number]   BOTH FEET   SHOULDER ARTHROSCOPY WITH BANKART REPAIR Left 11/02/2016   Procedure: SHOULDER ARTHROSCOPY WITH SUPERIOR LABRAL REPAIR , distal clavicle excision, and subacrominal decompression;  Surgeon: Juanell Fairly, MD;  Location: [facility];  Service: Orthopedics;  Laterality: Left;    Allergies  Allergen Reactions   Tape Other (See Comments)    ADHESIVE  CAUSED BLISTERS    Current Outpatient Medications  Medication Sig Dispense Refill   albuterol (VENTOLIN HFA) 108 (90 Base) MCG/ACT inhaler Inhale 2 puffs into the lungs every 6 (six) hours as needed for wheezing or shortness of breath. 1 each 2   b complex vitamins capsule Take 1 capsule by mouth daily.     Cholecalciferol (VITAMIN D PO) Take 2 tablets by mouth daily. 1500 iu per tablet     cyclobenzaprine (FLEXERIL) 5 MG tablet TAKE 1 TABLET(5 MG) BY MOUTH THREE TIMES DAILY AS NEEDED FOR MUSCLE SPASMS 30 tablet 5   doxycycline (VIBRA-TABS) 100 MG tablet Take 1 tablet (100 mg total) by mouth 2 (two) times daily for 7 days. 14 tablet 0   fluticasone (FLONASE) 50 MCG/ACT nasal spray  SHAKE LIQUID AND USE 2 SPRAYS IN EACH NOSTRIL DAILY AS NEEDED 48 g 5   HYDROcodone-acetaminophen (NORCO/VICODIN) 5-325 MG tablet TAKE 1 TABLET BY MOUTH EVERY 4 HOURS     hydrocortisone cream 0.5 % Apply topically.     metFORMIN (GLUCOPHAGE) 1000 MG tablet Take 1 tablet (1,000 mg total) by mouth 2 (two) times daily with a meal. 180 tablet 1   Multiple Vitamin (MULTIVITAMIN WITH MINERALS) TABS tablet Take 1 tablet by mouth daily.     simvastatin (ZOCOR) 20 MG tablet Take 1 tablet (20 mg total) by mouth daily at 6 PM. 90 tablet 1   No current facility-administered medications for this visit.    Family History Family History  Adopted: Yes  Problem Relation Age of Onset   Coronary artery disease Mother    Alcohol abuse Mother    Heart disease Mother    Diabetes Father    Alcohol abuse Father    Diabetes Brother       Social History Social History   Tobacco Use   Smoking status: Never   Smokeless tobacco: Never  Vaping Use   Vaping Use: Never used  Substance Use Topics   Alcohol use: No    Alcohol/week: 0.0 standard drinks of alcohol   Drug use: No      Review of Systems  Constitutional: Negative.   HENT: Negative.  Eyes: Negative.   Respiratory: Negative.    Cardiovascular: Negative.   Gastrointestinal: Negative.   Genitourinary: Negative.   Skin: Negative.   Neurological: Negative.   Psychiatric/Behavioral: Negative.       Physical Exam Blood pressure (!) 149/85, pulse 86, temperature 98.3 F (36.8 C), temperature source Oral, height 5\' 2"  (1.575 m), weight 182 lb (82.6 kg), SpO2 98 %. Last Weight  Most recent update: 02/11/2022  2:31 PM    Weight  82.6 kg (182 lb)             CONSTITUTIONAL: Well developed, and nourished, appropriately responsive and aware without distress.   EYES: Sclera non-icteric.   EARS, NOSE, MOUTH AND THROAT:  The oropharynx is clear. Oral mucosa is pink and moist.   Hearing is intact to voice.  NECK: Trachea is midline, and  there is no jugular venous distension.  LYMPH NODES:  Lymph nodes in the neck are not enlarged. RESPIRATORY:  Lungs are clear, and breath sounds are equal bilaterally. Normal respiratory effort without pathologic use of accessory muscles. CARDIOVASCULAR: Heart is regular in rate and rhythm. GI: The abdomen is soft, nontender, and nondistended. There were no palpable masses. I did not appreciate hepatosplenomegaly. There were normal bowel sounds. GU: Freda Munro present as chaperone.  There is a 2 cm area of pink discoloration to the left of midline at the natal cleft.  Within the center of this there is a 1 cm linear scar, this overlies an area of fluctuance/tenderness without marked erythema.  There may be hints of 2 tiny midline pits without any evidence of hirsutism.  There is no evidence of extension to the right side although she reports a history of this. MUSCULOSKELETAL:  Symmetrical muscle tone appreciated in all four extremities.    SKIN: Skin turgor is normal. No pathologic skin lesions appreciated.  NEUROLOGIC:  Motor and sensation appear grossly normal.  Cranial nerves are grossly without defect. PSYCH:  Alert and oriented to person, place and time. Affect is appropriate for situation.  Data Reviewed I have personally reviewed what is currently available of the patient's imaging, recent labs and medical records.   Labs:     Latest Ref Rng & Units 10/11/2021    2:37 PM 10/02/2020    9:15 AM 03/23/2020    5:49 AM  CBC  WBC 4.0 - 10.5 K/uL 9.9  12.6  13.8   Hemoglobin 12.0 - 15.0 g/dL 12.6  13.4  13.4   Hematocrit 36.0 - 46.0 % 39.5  41.0  39.8   Platelets 150 - 400 K/uL 367  449  402       Latest Ref Rng & Units 10/11/2021    2:37 PM 10/02/2020    9:15 AM 03/23/2020    5:49 AM  CMP  Glucose 70 - 99 mg/dL 159  146  307   BUN 6 - 20 mg/dL 21  14  19    Creatinine 0.44 - 1.00 mg/dL 1.42  0.75  1.11   Sodium 135 - 145 mmol/L 138  138  135   Potassium 3.5 - 5.1 mmol/L 3.9  4.3  4.0    Chloride 98 - 111 mmol/L 102  99  99   CO2 22 - 32 mmol/L 26  21  24    Calcium 8.9 - 10.3 mg/dL 9.2  9.6  9.2   Total Protein 6.0 - 8.5 g/dL  6.6    Total Bilirubin 0.0 - 1.2 mg/dL  0.4    Alkaline Phos 44 -  121 IU/L  54    AST 0 - 40 IU/L  17    ALT 0 - 32 IU/L  20        Imaging:  Within last 24 hrs: No results found.  Assessment    Pilonidal cyst, history of prior abscess, currently with recurrence and improvement with antibiotic. Patient Active Problem List   Diagnosis Date Noted   Boil of buttock 02/02/2022   Hyperlipidemia associated with type 2 diabetes mellitus (HCC) 10/22/2021   History of prosthetic unicompartmental arthroplasty of right knee 03/10/2021   Osteoarthritis of right knee 03/05/2021   Diabetes (HCC) 12/30/2014    Plan    Incision and drainage of pilonidal cyst/abscess.  After informed consent was obtained, the patient was placed prone.  The region was prepped and draped in usual sterile manner.  Local infiltration 1% lidocaine with epi applied to the area of the cyst.  An incision was made with an 11 blade after timeout.  With the absence of purulent drainage, but with the presence of cystic/proud flesh tissue I proceeded with excising a wider opening to allow for adequate wound care.  There was nothing to culture, there was soft fleshy granulation tissue consistent with hypergranulation/proud flesh. After irrigating the wound, it was packed with quarter inch iodoform packing strip.  Instructions and given for daily dressing changes.  She may shower with each dressing change.  Face-to-face time spent with the patient and accompanying care providers(if present) was 45 minutes, with more than 50% of the time spent counseling, educating, and coordinating care of the patient.    These notes generated with voice recognition software. I apologize for typographical errors.  Campbell Lerner M.D., FACS 02/11/2022, 2:41 PM

## 2022-02-11 NOTE — Patient Instructions (Signed)
Shower as usual. Remove the old packing. Wash the wound with soap and water and rinse well. Place new packing material into the bed of the wound and apply dry dressing over the area and secure with tape.   Please see your follow up appointment listed below.  Continue antibiotic until gone.

## 2022-02-18 ENCOUNTER — Ambulatory Visit (INDEPENDENT_AMBULATORY_CARE_PROVIDER_SITE_OTHER): Payer: 59 | Admitting: Surgery

## 2022-02-18 ENCOUNTER — Other Ambulatory Visit: Payer: Self-pay

## 2022-02-18 ENCOUNTER — Encounter: Payer: Self-pay | Admitting: Surgery

## 2022-02-18 VITALS — BP 142/82 | HR 109 | Temp 99.0°F | Ht 60.2 in | Wt 179.2 lb

## 2022-02-18 DIAGNOSIS — L0591 Pilonidal cyst without abscess: Secondary | ICD-10-CM

## 2022-02-18 DIAGNOSIS — Z09 Encounter for follow-up examination after completed treatment for conditions other than malignant neoplasm: Secondary | ICD-10-CM

## 2022-02-18 NOTE — Progress Notes (Signed)
Returns today for wound check of her pilonidal cyst incision. Not quite certain, but she and her husband appear to be managing with daily dressing changes.  Denies any remarkable pain tenderness, fevers or chills.  Her wound today appears to be very clean, it is well granulated without any significant irregularity.  Probed with a Q-tip I see no evidence of residual fibrinous exudate, nor evidence of any undrained foci, or evidence of tunneling or tracking. I repacked the wound with quarter inch wide iodoform packing strip.  Reassured them they are doing great with her wound care.  I will have her come back in a month or as needed for follow-up regarding any questions in her wound care or to assess for progress.

## 2022-02-18 NOTE — Patient Instructions (Signed)
Continue wound care as you have been doing. Please see your follow up appointment listed below.

## 2022-03-23 ENCOUNTER — Encounter: Payer: 59 | Admitting: Surgery

## 2022-04-01 ENCOUNTER — Ambulatory Visit: Payer: 59 | Admitting: Surgery

## 2022-04-01 ENCOUNTER — Encounter: Payer: Self-pay | Admitting: Surgery

## 2022-04-01 VITALS — BP 138/82 | HR 105 | Temp 98.8°F | Wt 181.6 lb

## 2022-04-01 DIAGNOSIS — Z09 Encounter for follow-up examination after completed treatment for conditions other than malignant neoplasm: Secondary | ICD-10-CM

## 2022-04-01 DIAGNOSIS — L0591 Pilonidal cyst without abscess: Secondary | ICD-10-CM

## 2022-04-01 NOTE — Patient Instructions (Addendum)
If you have any concerns or questions, please feel free to call our office. Follow up as needed.   Pilonidal Cyst  A pilonidal cyst is a fluid-filled sac that forms under the skin near the tailbone, at the top of the crease of the buttocks (pilonidal area). Cysts that are small and not infected may not cause any problems. Cysts that become irritated or infected may grow and fill with pus. An infected cyst is called an abscess. A pilonidal abscess may cause pain and swelling. It may need to be drained or removed. What are the causes? The cause of this condition is not always known. In some cases, it may be caused by a hair that grows into your skin (ingrown hair). What increases the risk? You are more likely to develop this condition if: You are female. You have lots of hair near the crease of the buttocks. You are overweight. You have a dimple near the crease of the buttocks. You wear tight clothing. You do not bathe or shower often. You sit for long periods of time. What are the signs or symptoms? Symptoms of this condition may include pain, swelling, redness, and warmth in the pilonidal area. You may also be able to feel a lump near your tailbone if your cyst is big. If your cyst becomes infected, symptoms may include: Pus or fluid drainage. Fever. Pain, swelling, and redness getting worse. The lump getting bigger. How is this diagnosed? This condition may be diagnosed based on: Your symptoms and medical history. A physical exam. A blood test to check for infection. A test of a pus sample. How is this treated? You may not need any treatment if your cyst does not cause symptoms. If your cyst bothers you or is infected, you may need a procedure to drain or remove the cyst. Depending on the size, location, and severity of your cyst, your health care provider may: Make an incision in the cyst and drain it (incision anddrainage). Open and drain the cyst, and then stitch the wound so that  it stays open while it heals (marsupialization). You will be given instructions about how to care for your open wound while it heals. Remove all or part of the cyst, and then close the wound (cyst removal). You may need to take antibiotics before your procedure. Follow these instructions at home: Medicines Take over-the-counter and prescription medicines only as told by your health care provider. If you were prescribed antibiotics, take them as told by your health care provider. Do not stop taking the antibiotic even if you start to feel better. General instructions Keep the area around the cyst clean and dry. If there is fluid or pus draining from your cyst: Cover the area with a clean bandage (dressing). Wash the area gently with soap and water. Pat the area dry with a clean towel. Do not rub the area because that may cause bleeding. Remove hair from the area around the cyst only if your health care provider tells you to. Do not wear tight pants or sit in one position for long periods at a time. Contact a health care provider if: You have new redness, swelling, or pain. You have a fever. You have severe pain. Summary A pilonidal cyst is a fluid-filled sac that forms under the skin near the tailbone, at the top of the crease of the buttocks (pilonidal area). Cysts that become irritated or infected may grow and fill with pus. An infected cyst is called an abscess. The  cause of this condition is not always known. In some cases, it may be caused by a hair that grows into your skin (ingrown hair). You may not need any treatment if your cyst does not cause symptoms. If your cyst bothers you or is infected, you may need a procedure to drain or remove the cyst. This information is not intended to replace advice given to you by your health care provider. Make sure you discuss any questions you have with your health care provider. Document Revised: 09/16/2021 Document Reviewed: 09/16/2021 Elsevier  Patient Education  2023 [employer].

## 2022-04-01 NOTE — Progress Notes (Signed)
This patient presented for follow-up, they have been continuing dressing changes. Upon review of her pilonidal site wound, we identified that there was no wound any longer and she had completely healed.  We gave her instructions that she could discontinue dressing changes.  And having no other problems in this area would follow her up as needed.  Advised that should she have a recurrence of the pilonidal cyst or pilonidal abscess would be glad to see her and assist with this hopefully get more definitive management to prevent future recurrence.  But as for now she appears to be healed in trouble free.

## 2022-04-13 ENCOUNTER — Other Ambulatory Visit: Payer: Self-pay | Admitting: Family Medicine

## 2022-04-13 DIAGNOSIS — E1165 Type 2 diabetes mellitus with hyperglycemia: Secondary | ICD-10-CM

## 2022-04-21 ENCOUNTER — Ambulatory Visit (INDEPENDENT_AMBULATORY_CARE_PROVIDER_SITE_OTHER): Payer: 59 | Admitting: Family Medicine

## 2022-04-21 ENCOUNTER — Encounter: Payer: Self-pay | Admitting: Family Medicine

## 2022-04-21 VITALS — BP 142/89 | HR 105 | Temp 98.7°F | Resp 16 | Ht 62.0 in | Wt 183.0 lb

## 2022-04-21 DIAGNOSIS — E1159 Type 2 diabetes mellitus with other circulatory complications: Secondary | ICD-10-CM | POA: Diagnosis not present

## 2022-04-21 DIAGNOSIS — E1165 Type 2 diabetes mellitus with hyperglycemia: Secondary | ICD-10-CM

## 2022-04-21 DIAGNOSIS — Z1159 Encounter for screening for other viral diseases: Secondary | ICD-10-CM | POA: Insufficient documentation

## 2022-04-21 DIAGNOSIS — Z0000 Encounter for general adult medical examination without abnormal findings: Secondary | ICD-10-CM | POA: Insufficient documentation

## 2022-04-21 DIAGNOSIS — Z1231 Encounter for screening mammogram for malignant neoplasm of breast: Secondary | ICD-10-CM | POA: Insufficient documentation

## 2022-04-21 DIAGNOSIS — E1169 Type 2 diabetes mellitus with other specified complication: Secondary | ICD-10-CM | POA: Diagnosis not present

## 2022-04-21 DIAGNOSIS — N1831 Chronic kidney disease, stage 3a: Secondary | ICD-10-CM | POA: Diagnosis not present

## 2022-04-21 DIAGNOSIS — E785 Hyperlipidemia, unspecified: Secondary | ICD-10-CM

## 2022-04-21 DIAGNOSIS — Z114 Encounter for screening for human immunodeficiency virus [HIV]: Secondary | ICD-10-CM | POA: Insufficient documentation

## 2022-04-21 DIAGNOSIS — I152 Hypertension secondary to endocrine disorders: Secondary | ICD-10-CM

## 2022-04-21 MED ORDER — HYDROCORTISONE 0.5 % EX CREA: TOPICAL_CREAM | Freq: Two times a day (BID) | CUTANEOUS | 11 refills | Status: AC

## 2022-04-21 NOTE — Assessment & Plan Note (Signed)
Discussed start of agent to assist pt currently declines and plans to check BP at work Goal of <130/<80 recommended with co-existing DM

## 2022-04-21 NOTE — Assessment & Plan Note (Signed)
Chronic, last A1c elevated at 7.7% Needs to make eye appt Defer foot exam today Repeat A1c Continue to recommend balanced, lower carb meals. Smaller meal size, adding snacks. Choosing water as drink of choice and increasing purposeful exercise. Discussed HTN and HLD with DM management; has been working to eat more protein and less carbs- not consistent daily

## 2022-04-21 NOTE — Assessment & Plan Note (Signed)
Chronic, previously stable Recommend repeat CMP Patient denies any urinary complaints Last urine micro completed 4/23; repeat in 6 months Discussed addition of ACEi/ARB- however, patient continues to work on lifestyle to assist with control of eGFR and HTN

## 2022-04-21 NOTE — Assessment & Plan Note (Signed)
Due for screening for mammogram, denies breast concerns, provided with phone number to call and schedule appointment for mammogram. Encouraged to repeat breast cancer screening every 1-2 years.  Please call and schedule your mammogram:  [facility]  [address], [address] [facility] [place],  [place]  [address] Get Driving Directions Main: [phone]  Sunday:Closed Monday:7:20 AM - 5:00 PM Tuesday:7:20 AM - 5:00 PM Wednesday:7:20 AM - 5:00 PM Thursday:7:20 AM - 5:00 PM Friday:7:20 AM - 4:30 PM Saturday:Closed  

## 2022-04-21 NOTE — Assessment & Plan Note (Signed)
Low risk screen Treatable, and curable. If left untreated Hep C can lead to cirrhosis and liver failure. Encourage routine testing; recommend repeat testing if risk factors change.  

## 2022-04-21 NOTE — Assessment & Plan Note (Signed)
Chronic, previously stable Repeat LP Currently goal of 55-70 On zocor 20 mg

## 2022-04-21 NOTE — Progress Notes (Signed)
Complete physical exam   Patient: Lisa Rivers   DOB: 1970/09/26   51 y.o. Female  MRN: [id number] Visit Date: 04/21/2022  Today's healthcare provider: Jacky Kindle, FNP   I,Tiffany J Bragg,acting as a scribe for Jacky Kindle, FNP.,have documented all relevant documentation on the behalf of Jacky Kindle, FNP,as directed by  Jacky Kindle, FNP while in the presence of Jacky Kindle, FNP.   Chief Complaint  Patient presents with   Annual Exam   Subjective    Lisa Rivers is a 51 y.o. female who presents today for a complete physical exam.  She reports consuming a diabetic, low calorie diet.  Walking for exercise  She generally feels well. She reports sleeping fairly well. She does not have additional problems to discuss today.  HPI   Past Medical History:  Diagnosis Date   ACL tear    Adopted    Arthritis    Asthma    Diabetes mellitus without complication (HCC)    Headache    MIRGRAINES   Hyperlipidemia    Past Surgical History:  Procedure Laterality Date   APPENDECTOMY  12/2013   BACK SURGERY  02/2019   FOOT SURGERY  0462,9812   BOTH FEET   SHOULDER ARTHROSCOPY WITH BANKART REPAIR Left 11/02/2016   Procedure: SHOULDER ARTHROSCOPY WITH SUPERIOR LABRAL REPAIR , distal clavicle excision, and subacrominal decompression;  Surgeon: Juanell Fairly, MD;  Location: [facility] ORS;  Service: Orthopedics;  Laterality: Left;   Social History   Socioeconomic History   Marital status: Married    Spouse name: Thayer Ohm   Number of children: 0   Years of education: 12   Highest education level: Not on file  Occupational History   Occupation: full time-city of Product/process development scientist: [employer]  Tobacco Use   Smoking status: Never   Smokeless tobacco: Never  Vaping Use   Vaping Use: Never used  Substance and Sexual Activity   Alcohol use: No    Alcohol/week: 0.0 standard drinks of alcohol   Drug use: No   Sexual activity: Not on file  Other Topics Concern    Not on file  Social History Narrative   Not on file   Social Determinants of Health   Financial Resource Strain: Not on file  Food Insecurity: Not on file  Transportation Needs: Not on file  Physical Activity: Not on file  Stress: Not on file  Social Connections: Not on file  Intimate Partner Violence: Not on file   Family Status  Relation Name Status   Mother  Deceased at age 28   Father  Deceased at age 67   Brother  Alive   Sister  Alive   Brother  Deceased at age 33       shot himself   Brother  Deceased       shot himself   Brother  Deceased       complications from Hepatitis C   Brother  Alive   Family History  Adopted: Yes  Problem Relation Age of Onset   Coronary artery disease Mother    Alcohol abuse Mother    Heart disease Mother    Diabetes Father    Alcohol abuse Father    Diabetes Brother    Allergies  Allergen Reactions   Tape Other (See Comments)    ADHESIVE  CAUSED BLISTERS    Patient Care Team: Jacky Kindle, FNP as PCP - General (  Family Medicine)   Medications: Outpatient Medications Prior to Visit  Medication Sig   albuterol (VENTOLIN HFA) 108 (90 Base) MCG/ACT inhaler Inhale 2 puffs into the lungs every 6 (six) hours as needed for wheezing or shortness of breath.   b complex vitamins capsule Take 1 capsule by mouth daily.   Cholecalciferol (VITAMIN D PO) Take 2 tablets by mouth daily. 1500 iu per tablet   cyclobenzaprine (FLEXERIL) 5 MG tablet TAKE 1 TABLET(5 MG) BY MOUTH THREE TIMES DAILY AS NEEDED FOR MUSCLE SPASMS   fluticasone (FLONASE) 50 MCG/ACT nasal spray SHAKE LIQUID AND USE 2 SPRAYS IN EACH NOSTRIL DAILY AS NEEDED   metFORMIN (GLUCOPHAGE) 1000 MG tablet TAKE 1 TABLET(1000 MG) BY MOUTH TWICE DAILY WITH A MEAL   Multiple Vitamin (MULTIVITAMIN WITH MINERALS) TABS tablet Take 1 tablet by mouth daily.   simvastatin (ZOCOR) 20 MG tablet Take 1 tablet (20 mg total) by mouth daily at 6 PM.   [DISCONTINUED] hydrocortisone cream 0.5 %  Apply topically.   No facility-administered medications prior to visit.    Review of Systems  Cardiovascular:  Positive for leg swelling.  Allergic/Immunologic: Positive for environmental allergies.  Neurological:  Positive for headaches.    Objective    BP (!) 142/89 (BP Location: Right Arm, Patient Position: Sitting, Cuff Size: Normal)   Pulse (!) 105   Temp 98.7 F (37.1 C)   Resp 16   Ht $R'5\' 2"'mE$  (1.575 m)   Wt 183 lb (83 kg)   SpO2 98%   BMI 33.47 kg/m   Physical Exam Vitals and nursing note reviewed.  Constitutional:      General: She is awake. She is not in acute distress.    Appearance: Normal appearance. She is well-developed and well-groomed. She is obese. She is not ill-appearing, toxic-appearing or diaphoretic.  HENT:     Head: Normocephalic and atraumatic.     Jaw: There is normal jaw occlusion. No trismus, tenderness, swelling or pain on movement.     Right Ear: Hearing, tympanic membrane, ear canal and external ear normal. There is no impacted cerumen.     Left Ear: Hearing, tympanic membrane, ear canal and external ear normal. There is no impacted cerumen.     Nose: Nose normal. No congestion or rhinorrhea.     Right Turbinates: Not enlarged, swollen or pale.     Left Turbinates: Not enlarged, swollen or pale.     Right Sinus: No maxillary sinus tenderness or frontal sinus tenderness.     Left Sinus: No maxillary sinus tenderness or frontal sinus tenderness.     Mouth/Throat:     Lips: Pink.     Mouth: Mucous membranes are moist. No injury.     Tongue: No lesions.     Pharynx: Oropharynx is clear. Uvula midline. No pharyngeal swelling, oropharyngeal exudate, posterior oropharyngeal erythema or uvula swelling.     Tonsils: No tonsillar exudate or tonsillar abscesses.  Eyes:     General: Lids are normal. Lids are everted, no foreign bodies appreciated. Vision grossly intact. Gaze aligned appropriately. No allergic shiner or visual field deficit.       Right  eye: No discharge.        Left eye: No discharge.     Extraocular Movements: Extraocular movements intact.     Conjunctiva/sclera: Conjunctivae normal.     Right eye: Right conjunctiva is not injected. No exudate.    Left eye: Left conjunctiva is not injected. No exudate.    Pupils: Pupils are  equal, round, and reactive to light.  Neck:     Thyroid: No thyroid mass, thyromegaly or thyroid tenderness.     Vascular: No carotid bruit.     Trachea: Trachea normal.  Cardiovascular:     Rate and Rhythm: Normal rate and regular rhythm.     Pulses: Normal pulses.          Carotid pulses are 2+ on the right side and 2+ on the left side.      Radial pulses are 2+ on the right side and 2+ on the left side.       Dorsalis pedis pulses are 2+ on the right side and 2+ on the left side.       Posterior tibial pulses are 2+ on the right side and 2+ on the left side.     Heart sounds: Normal heart sounds, S1 normal and S2 normal. No murmur heard.    No friction rub. No gallop.  Pulmonary:     Effort: Pulmonary effort is normal. No respiratory distress.     Breath sounds: Normal breath sounds and air entry. No stridor. No wheezing, rhonchi or rales.  Chest:     Chest wall: No tenderness.  Abdominal:     General: Abdomen is flat. Bowel sounds are normal. There is no distension.     Palpations: Abdomen is soft. There is no mass.     Tenderness: There is no abdominal tenderness. There is no right CVA tenderness, left CVA tenderness, guarding or rebound.     Hernia: No hernia is present.  Genitourinary:    Comments: Exam deferred; denies complaints Musculoskeletal:        General: No swelling, tenderness, deformity or signs of injury. Normal range of motion.     Cervical back: Full passive range of motion without pain, normal range of motion and neck supple. No edema, rigidity or tenderness. No muscular tenderness.     Right lower leg: No edema.     Left lower leg: No edema.  Lymphadenopathy:      Cervical: No cervical adenopathy.     Right cervical: No superficial, deep or posterior cervical adenopathy.    Left cervical: No superficial, deep or posterior cervical adenopathy.  Skin:    General: Skin is warm and dry.     Capillary Refill: Capillary refill takes less than 2 seconds.     Coloration: Skin is not jaundiced or pale.     Findings: No bruising, erythema, lesion or rash.  Neurological:     General: No focal deficit present.     Mental Status: She is alert and oriented to person, place, and time. Mental status is at baseline.     GCS: GCS eye subscore is 4. GCS verbal subscore is 5. GCS motor subscore is 6.     Sensory: Sensation is intact. No sensory deficit.     Motor: Motor function is intact. No weakness.     Coordination: Coordination is intact. Coordination normal.     Gait: Gait is intact. Gait normal.  Psychiatric:        Attention and Perception: Attention and perception normal.        Mood and Affect: Mood and affect normal.        Speech: Speech normal.        Behavior: Behavior normal. Behavior is cooperative.        Thought Content: Thought content normal.        Cognition and Memory: Cognition and memory  normal.        Judgment: Judgment normal.      Last depression screening scores    04/21/2022    2:20 PM 02/02/2022    2:43 PM 10/22/2021    2:26 PM  PHQ 2/9 Scores  PHQ - 2 Score 0 0 0  PHQ- 9 Score 0  0   Last fall risk screening    04/21/2022    2:20 PM  [place] in the past year? 0  Number falls in past yr: 0  Injury with Fall? 0  Risk for fall due to : No Fall Risks  Follow up Falls evaluation completed   Last Audit-C alcohol use screening    04/21/2022    2:21 PM  Alcohol Use Disorder Test (AUDIT)  1. How often do you have a drink containing alcohol? 0  2. How many drinks containing alcohol do you have on a typical day when you are drinking? 0  3. How often do you have six or more drinks on one occasion? 0  AUDIT-C Score  0   A score of 3 or more in women, and 4 or more in men indicates increased risk for alcohol abuse, EXCEPT if all of the points are from question 1   Results for orders placed or performed in visit on 04/21/22  HM DIABETES EYE EXAM  Result Value Ref Range   HM Diabetic Eye Exam No Retinopathy No Retinopathy    Assessment & Plan    Routine Health Maintenance and Physical Exam  Exercise Activities and Dietary recommendations  Goals   None     Immunization History  Administered Date(s) Administered   PFIZER Comirnaty([place])Covid-19 Tri-Sucrose Vaccine 03/14/2020   Tdap 08/21/2019, 09/18/2020    Health Maintenance  Topic Date Due   Hepatitis C Screening  Never done   COVID-19 Vaccine (2 - [place] series) 05/07/2022 (Originally 05/09/2020)   Zoster Vaccines- Shingrix (1 of 2) 07/22/2022 (Originally 05/23/2019)   INFLUENZA VACCINE  10/03/2022 (Originally 02/02/2022)   FOOT EXAM  10/18/2022 (Originally 08/29/2020)   HEMOGLOBIN A1C  04/23/2022   MAMMOGRAM  06/06/2022   PAP SMEAR-Modifier  08/29/2022   Fecal DNA (Cologuard)  10/11/2022   Diabetic kidney evaluation - GFR measurement  10/12/2022   Diabetic kidney evaluation - Urine ACR  10/23/2022   OPHTHALMOLOGY EXAM  11/21/2022   TETANUS/TDAP  09/19/2030   HIV Screening  Completed   HPV VACCINES  Aged Out    Discussed health benefits of physical activity, and encouraged her to engage in regular exercise appropriate for her age and condition.  Problem List Items Addressed This Visit       Cardiovascular and Mediastinum   Hypertension associated with diabetes ([place])    Discussed start of agent to assist pt currently declines and plans to check BP at work Goal of <130/<80 recommended with co-existing DM         Endocrine   Hyperlipidemia associated with type 2 diabetes mellitus ([place])    Chronic, previously stable Repeat LP Currently goal of 55-70 On zocor 20 mg       Relevant Orders   Lipid panel   Type 2 diabetes  mellitus with hyperglycemia, without long-term current use of insulin (HCC)    Chronic, last A1c elevated at 7.7% Needs to make eye appt Defer foot exam today Repeat A1c Continue to recommend balanced, lower carb meals. Smaller meal size, adding snacks. Choosing water as drink of choice and increasing purposeful exercise.  Discussed HTN and HLD with DM management; has been working to eat more protein and less carbs- not consistent daily       Relevant Orders   Hemoglobin A1c     Genitourinary   Stage 3a chronic kidney disease ([place])    Chronic, previously stable Recommend repeat CMP Patient denies any urinary complaints Last urine micro completed 4/23; repeat in 6 months Discussed addition of ACEi/ARB- however, patient continues to work on lifestyle to assist with control of eGFR and HTN      Relevant Orders   Comprehensive Metabolic Panel (CMET)     Other   Encounter for health maintenance examination - Primary    Recommend annual/bi annual vision and dental screening Repeat labs today Due for mammo UTD on PAP and cologuard No complaints Things to do to keep yourself healthy  - Exercise at least 30-45 minutes a day, 3-4 days a week.  - Eat a low-fat diet with lots of fruits and vegetables, up to 7-9 servings per day.  - Seatbelts can save your life. Wear them always.  - Smoke detectors on every level of your home, check batteries every year.  - Eye Doctor - have an eye exam every 1-2 years  - Safe sex - if you may be exposed to STDs, use a condom.  - Alcohol -  If you drink, do it moderately, less than 2 drinks per day.  - [place]. Choose someone to speak for you if you are not able.  - Depression is common in our stressful world.If you're feeling down or losing interest in things you normally enjoy, please come in for a visit.  - Violence - If anyone is threatening or hurting you, please call immediately.        Relevant Orders   Comprehensive  Metabolic Panel (CMET)   CBC   Encounter for hepatitis C screening test for low risk patient    Low risk screen Treatable, and curable. If left untreated Hep C can lead to cirrhosis and liver failure. Encourage routine testing; recommend repeat testing if risk factors change.       Relevant Orders   Hepatitis C Antibody   Encounter for screening for HIV    Low risk screen Consented; encouraged to "know your status" Recommend repeat screen if risk factors change       Relevant Orders   HIV antibody (with reflex)   Encounter for screening mammogram for malignant neoplasm of breast    Due for screening for mammogram, denies breast concerns, provided with phone number to call and schedule appointment for mammogram. Encouraged to repeat breast cancer screening every 1-2 years. Please call and schedule your mammogram:  [facility]  [address], [place],  [place]  [id number] Get Driving Directions Main: [phone]  Sunday:Closed Monday:7:20 AM - 5:00 PM Tuesday:7:20 AM - 5:00 PM Wednesday:7:20 AM - 5:00 PM Thursday:7:20 AM - 5:00 PM Friday:7:20 AM - 4:30 PM Saturday:Closed       Relevant Orders   MM 3D SCREEN BREAST BILATERAL     Return in about 6 months (around 10/21/2022) for chonic disease management.     Vonna Kotyk, FNP, have reviewed all documentation for this visit. The documentation on 04/21/22 for the exam, diagnosis, procedures, and orders are all accurate and complete.    Gwyneth Sprout, [place] [phone] (phone) [phone] (fax)  [facility]

## 2022-04-21 NOTE — Assessment & Plan Note (Signed)
Low risk screen ?Consented; encouraged to "know your status" ?Recommend repeat screen if risk factors change ? ?

## 2022-04-21 NOTE — Assessment & Plan Note (Signed)
Recommend annual/bi annual vision and dental screening Repeat labs today Due for mammo UTD on PAP and cologuard No complaints Things to do to keep yourself healthy  - Exercise at least 30-45 minutes a day, 3-4 days a week.  - Eat a low-fat diet with lots of fruits and vegetables, up to 7-9 servings per day.  - Seatbelts can save your life. Wear them always.  - Smoke detectors on every level of your home, check batteries every year.  - Eye Doctor - have an eye exam every 1-2 years  - Safe sex - if you may be exposed to STDs, use a condom.  - Alcohol -  If you drink, do it moderately, less than 2 drinks per day.  - [place]. Choose someone to speak for you if you are not able.  - Depression is common in our stressful world.If you're feeling down or losing interest in things you normally enjoy, please come in for a visit.  - Violence - If anyone is threatening or hurting you, please call immediately.

## 2022-04-22 ENCOUNTER — Other Ambulatory Visit: Payer: Self-pay | Admitting: Family Medicine

## 2022-04-22 LAB — CBC
Hematocrit: 42.4 % (ref 34.0–46.6)
Hemoglobin: 14 g/dL (ref 11.1–15.9)
MCH: 29.2 pg (ref 26.6–33.0)
MCHC: 33 g/dL (ref 31.5–35.7)
MCV: 89 fL (ref 79–97)
Platelets: 443 10*3/uL (ref 150–450)
RBC: 4.79 x10E6/uL (ref 3.77–5.28)
RDW: 12.2 % (ref 11.7–15.4)
WBC: 9.5 10*3/uL (ref 3.4–10.8)

## 2022-04-22 LAB — COMPREHENSIVE METABOLIC PANEL
ALT: 43 IU/L — ABNORMAL HIGH (ref 0–32)
AST: 30 IU/L (ref 0–40)
Albumin/Globulin Ratio: 1.7 (ref 1.2–2.2)
Albumin: 4.7 g/dL (ref 3.8–4.9)
Alkaline Phosphatase: 76 IU/L (ref 44–121)
BUN/Creatinine Ratio: 16 (ref 9–23)
BUN: 13 mg/dL (ref 6–24)
Bilirubin Total: 0.3 mg/dL (ref 0.0–1.2)
CO2: 25 mmol/L (ref 20–29)
Calcium: 10.1 mg/dL (ref 8.7–10.2)
Chloride: 96 mmol/L (ref 96–106)
Creatinine, Ser: 0.83 mg/dL (ref 0.57–1.00)
Globulin, Total: 2.7 g/dL (ref 1.5–4.5)
Glucose: 192 mg/dL — ABNORMAL HIGH (ref 70–99)
Potassium: 4.5 mmol/L (ref 3.5–5.2)
Sodium: 137 mmol/L (ref 134–144)
Total Protein: 7.4 g/dL (ref 6.0–8.5)
eGFR: 85 mL/min/{1.73_m2} (ref >59)

## 2022-04-22 LAB — LIPID PANEL
Chol/HDL Ratio: 3.6 ratio (ref 0.0–4.4)
Cholesterol, Total: 164 mg/dL (ref 100–199)
HDL: 46 mg/dL (ref >39)
LDL Chol Calc (NIH): 60 mg/dL (ref 0–99)
Triglycerides: 376 mg/dL — ABNORMAL HIGH (ref 0–149)
VLDL Cholesterol Cal: 58 mg/dL — ABNORMAL HIGH (ref 5–40)

## 2022-04-22 LAB — HIV ANTIBODY (ROUTINE TESTING W REFLEX): HIV Screen 4th Generation wRfx: NONREACTIVE

## 2022-04-22 LAB — HEMOGLOBIN A1C
Est. average glucose Bld gHb Est-mCnc: 192 mg/dL
Hgb A1c MFr Bld: 8.3 % — ABNORMAL HIGH (ref 4.8–5.6)

## 2022-04-22 LAB — HEPATITIS C ANTIBODY: Hep C Virus Ab: NONREACTIVE

## 2022-04-22 MED ORDER — GLIPIZIDE 10 MG PO TABS: 10.0000 mg | ORAL_TABLET | Freq: Two times a day (BID) | ORAL | 3 refills | Status: DC

## 2022-04-22 NOTE — Progress Notes (Signed)
All labs look great with exception of increased A1c above goal of 8%. Would recommend additional agent to assist Metformin if agreeable. Can do oral or injectable as well.   Continue to recommend balanced, lower carb meals. Smaller meal size, adding snacks. Choosing water as drink of choice and increasing purposeful exercise.  Gwyneth Sprout, [address] [place],  [address] [phone] (phone) [phone] (fax) [place]

## 2022-05-04 ENCOUNTER — Other Ambulatory Visit: Payer: Self-pay | Admitting: Family Medicine

## 2022-05-04 DIAGNOSIS — E785 Hyperlipidemia, unspecified: Secondary | ICD-10-CM

## 2022-06-15 ENCOUNTER — Encounter: Payer: Self-pay | Admitting: Family Medicine

## 2022-06-15 ENCOUNTER — Ambulatory Visit
Admission: RE | Admit: 2022-06-15 | Discharge: 2022-06-15 | Disposition: A | Discharge status: Home / Self Care | Payer: 59 | Source: Ambulatory Visit | Attending: Family Medicine | Admitting: Family Medicine

## 2022-06-15 ENCOUNTER — Ambulatory Visit: Payer: 59 | Admitting: Family Medicine

## 2022-06-15 VITALS — BP 133/81 | HR 100 | Resp 16 | Ht 62.0 in | Wt 187.0 lb

## 2022-06-15 DIAGNOSIS — I152 Hypertension secondary to endocrine disorders: Secondary | ICD-10-CM

## 2022-06-15 DIAGNOSIS — M545 Low back pain, unspecified: Secondary | ICD-10-CM | POA: Insufficient documentation

## 2022-06-15 DIAGNOSIS — R1032 Left lower quadrant pain: Secondary | ICD-10-CM | POA: Insufficient documentation

## 2022-06-15 DIAGNOSIS — R311 Benign essential microscopic hematuria: Secondary | ICD-10-CM

## 2022-06-15 DIAGNOSIS — R1012 Left upper quadrant pain: Secondary | ICD-10-CM | POA: Insufficient documentation

## 2022-06-15 DIAGNOSIS — E1159 Type 2 diabetes mellitus with other circulatory complications: Secondary | ICD-10-CM

## 2022-06-15 DIAGNOSIS — R1084 Generalized abdominal pain: Secondary | ICD-10-CM | POA: Insufficient documentation

## 2022-06-15 LAB — POCT URINALYSIS DIPSTICK
Bilirubin, UA: NEGATIVE
Blood, UA: POSITIVE
Glucose, UA: NEGATIVE
Ketones, UA: NEGATIVE
Leukocytes, UA: NEGATIVE
Nitrite, UA: NEGATIVE
Protein, UA: NEGATIVE
Spec Grav, UA: 1.025 (ref 1.010–1.025)
Urobilinogen, UA: 0.2 E.U./dL
pH, UA: 6 (ref 5.0–8.0)

## 2022-06-15 MED ORDER — SULFAMETHOXAZOLE-TRIMETHOPRIM 800-160 MG PO TABS: 1.0000 | ORAL_TABLET | Freq: Two times a day (BID) | ORAL | 0 refills | Status: DC

## 2022-06-15 MED ORDER — METHOCARBAMOL 750 MG PO TABS: 750.0000 mg | ORAL_TABLET | Freq: Three times a day (TID) | ORAL | 0 refills | Status: DC | PRN

## 2022-06-15 NOTE — Assessment & Plan Note (Signed)
Noted on POCT UA; will send for Ucx Start Abx given concern for infection Complete abdominal US and if positive abdominal CT given vague complaints with hx of kidney stones

## 2022-06-15 NOTE — Assessment & Plan Note (Addendum)
Trial of muscle relaxants to assist with LLQ pain and low back pain wrapping to L side  Complete abdominal US and if positive abdominal CT given vague complaints with hx of kidney stones

## 2022-06-15 NOTE — Assessment & Plan Note (Signed)
Chronic, elevated today Goal <130/<80 However pt with acute abdominal pain Continue diet and exercise to assist

## 2022-06-15 NOTE — Progress Notes (Signed)
Established patient visit   Patient: Lisa Rivers   DOB: March 26, 1971   51 y.o. Female  MRN: [id number] Visit Date: 06/15/2022  Today's healthcare provider: Jacky Kindle, FNP  Re Introduced to nurse practitioner role and practice setting.  All questions answered.  Discussed provider/patient relationship and expectations.   I,Tiffany J Bragg,acting as a scribe for Jacky Kindle, FNP.,have documented all relevant documentation on the behalf of Jacky Kindle, FNP,as directed by  Jacky Kindle, FNP while in the presence of Jacky Kindle, FNP.   Chief Complaint  Patient presents with   Flank Pain   Subjective    HPI HPI   Patient complains of L flank and back pain for a month that is worsening.  Last edited by Marlana Salvage, CMA on 06/15/2022  3:16 PM.       Medications: Outpatient Medications Prior to Visit  Medication Sig   albuterol (VENTOLIN HFA) 108 (90 Base) MCG/ACT inhaler Inhale 2 puffs into the lungs every 6 (six) hours as needed for wheezing or shortness of breath.   b complex vitamins capsule Take 1 capsule by mouth daily.   Cholecalciferol (VITAMIN D PO) Take 2 tablets by mouth daily. 1500 iu per tablet   fluticasone (FLONASE) 50 MCG/ACT nasal spray SHAKE LIQUID AND USE 2 SPRAYS IN EACH NOSTRIL DAILY AS NEEDED   glipiZIDE (GLUCOTROL) 10 MG tablet Take 1 tablet (10 mg total) by mouth 2 (two) times daily before a meal.   hydrocortisone cream 0.5 % Apply topically 2 (two) times daily.   metFORMIN (GLUCOPHAGE) 1000 MG tablet TAKE 1 TABLET(1000 MG) BY MOUTH TWICE DAILY WITH A MEAL   Multiple Vitamin (MULTIVITAMIN WITH MINERALS) TABS tablet Take 1 tablet by mouth daily.   simvastatin (ZOCOR) 20 MG tablet TAKE 1 TABLET(20 MG) BY MOUTH DAILY AT 6 PM   [DISCONTINUED] cyclobenzaprine (FLEXERIL) 5 MG tablet TAKE 1 TABLET(5 MG) BY MOUTH THREE TIMES DAILY AS NEEDED FOR MUSCLE SPASMS   No facility-administered medications prior to visit.    Review of Systems     Objective    BP 133/81 (BP Location: Left Arm, Patient Position: Sitting, Cuff Size: Normal)   Pulse 100   Resp 16   Ht 5\' 2"  (1.575 m)   Wt 187 lb (84.8 kg)   SpO2 100%   BMI 34.20 kg/m   Physical Exam Vitals and nursing note reviewed.  Constitutional:      General: She is not in acute distress.    Appearance: Normal appearance. She is obese. She is not ill-appearing, toxic-appearing or diaphoretic.  HENT:     Head: Normocephalic and atraumatic.  Cardiovascular:     Rate and Rhythm: Normal rate and regular rhythm.     Pulses: Normal pulses.     Heart sounds: Normal heart sounds. No murmur heard.    No friction rub. No gallop.  Pulmonary:     Effort: Pulmonary effort is normal. No respiratory distress.     Breath sounds: Normal breath sounds. No stridor. No wheezing, rhonchi or rales.  Chest:     Chest wall: No tenderness.  Abdominal:     Palpations: Abdomen is soft.     Tenderness: There is abdominal tenderness. There is left CVA tenderness, guarding and rebound. There is no right CVA tenderness.  Musculoskeletal:        General: No swelling, tenderness, deformity or signs of injury. Normal range of motion.     Right lower leg:  No edema.     Left lower leg: No edema.  Skin:    General: Skin is warm and dry.     Capillary Refill: Capillary refill takes less than 2 seconds.     Coloration: Skin is not jaundiced or pale.     Findings: No bruising, erythema, lesion or rash.  Neurological:     General: No focal deficit present.     Mental Status: She is alert and oriented to person, place, and time. Mental status is at baseline.     Cranial Nerves: No cranial nerve deficit.     Sensory: No sensory deficit.     Motor: No weakness.     Coordination: Coordination normal.  Psychiatric:        Mood and Affect: Mood normal.        Behavior: Behavior normal.        Thought Content: Thought content normal.        Judgment: Judgment normal.      Results for orders placed or  performed in visit on 06/15/22  POCT Urinalysis Dipstick  Result Value Ref Range   Color, UA yellow    Clarity, UA clear    Glucose, UA Negative Negative   Bilirubin, UA negative    Ketones, UA negative    Spec Grav, UA 1.025 1.010 - 1.025   Blood, UA positive    pH, UA 6.0 5.0 - 8.0   Protein, UA Negative Negative   Urobilinogen, UA 0.2 0.2 or 1.0 E.U./dL   Nitrite, UA negative    Leukocytes, UA Negative Negative   Appearance     Odor      Assessment & Plan     Problem List Items Addressed This Visit       Cardiovascular and Mediastinum   Hypertension associated with diabetes (HCC)    Chronic, elevated today Goal <130/<80 However pt with acute abdominal pain Continue diet and exercise to assist         Genitourinary   Benign essential microscopic hematuria    Noted on POCT UA; will send for Ucx Start Abx given concern for infection Complete abdominal US and if positive abdominal CT given vague complaints with hx of kidney stones      Relevant Medications   sulfamethoxazole-trimethoprim (BACTRIM DS) 800-160 MG tablet   Other Relevant Orders   CT Abdomen Pelvis W Contrast     Other   Generalized abdominal pain    Complete abdominal US and if positive abdominal CT given vague complaints with hx of kidney stones      Relevant Orders   CT Abdomen Pelvis W Contrast   Left low back pain - Primary    Complete abdominal US and if positive abdominal CT given vague complaints with hx of kidney stones EKG completed for concern for possible cardiac involvement given hx of HTN, DM and elevated HR of >100 ST EKG confirms SR, no ectopy; low suspicion for cardiac involvement following EKG Hx of EKG in 10/2016- however, unable to pull up on MUSE for comparison       Relevant Medications   methocarbamol (ROBAXIN-750) 750 MG tablet   Other Relevant Orders   POCT Urinalysis Dipstick (Completed)   US Abdomen Complete   EKG 12-Lead   CT Abdomen Pelvis W Contrast   Urine  Culture   LLQ pain    Trial of muscle relaxants to assist with LLQ pain and low back pain wrapping to L side  Complete abdominal US and  if positive abdominal CT given vague complaints with hx of kidney stones      Relevant Medications   methocarbamol (ROBAXIN-750) 750 MG tablet   Other Relevant Orders   US Abdomen Complete   CT Abdomen Pelvis W Contrast   LUQ pain    Complete abdominal US and if positive abdominal CT given vague complaints with hx of kidney stones      Relevant Orders   US Abdomen Complete   EKG 12-Lead   CT Abdomen Pelvis W Contrast   Return if symptoms worsen or fail to improve.     Leilani Merl, FNP, have reviewed all documentation for this visit. The documentation on 06/15/22 for the exam, diagnosis, procedures, and orders are all accurate and complete.  Jacky Kindle, FNP  [facility] [phone] (phone) [phone] (fax)  [facility]

## 2022-06-15 NOTE — Assessment & Plan Note (Signed)
Complete abdominal US and if positive abdominal CT given vague complaints with hx of kidney stones

## 2022-06-15 NOTE — Assessment & Plan Note (Addendum)
Complete abdominal US and if positive abdominal CT given vague complaints with hx of kidney stones EKG completed for concern for possible cardiac involvement given hx of HTN, DM and elevated HR of >100 ST EKG confirms SR, no ectopy; low suspicion for cardiac involvement following EKG Hx of EKG in 10/2016- however, unable to pull up on MUSE for comparison

## 2022-06-15 NOTE — Assessment & Plan Note (Signed)
Complete abdominal US and if positive abdominal CT given vague complaints with hx of kidney stones 

## 2022-06-16 NOTE — Progress Notes (Signed)
Cancel CT given no kidney stones noted. Only thing noted on abdominal US was diffuse fatty liver; continue to recommend lower fat diet and exercise to assist. Can follow up with GI if desired, will need referral placed, for additional scan to gauge percentage of liver which is showing fat.  Take care, Jacky Kindle, FNP  [facility] [address] [place], [place] [address] [phone] (phone) [phone] (fax) [facility]

## 2022-06-18 LAB — URINE CULTURE

## 2022-06-24 ENCOUNTER — Encounter: Payer: Self-pay | Admitting: Emergency Medicine

## 2022-06-24 ENCOUNTER — Emergency Department
Admission: EM | Admit: 2022-06-24 | Discharge: 2022-06-24 | Disposition: A | Discharge status: Home / Self Care | Payer: 59 | Attending: Emergency Medicine | Admitting: Emergency Medicine

## 2022-06-24 ENCOUNTER — Other Ambulatory Visit: Payer: Self-pay

## 2022-06-24 ENCOUNTER — Emergency Department: Payer: 59

## 2022-06-24 DIAGNOSIS — E1122 Type 2 diabetes mellitus with diabetic chronic kidney disease: Secondary | ICD-10-CM | POA: Insufficient documentation

## 2022-06-24 DIAGNOSIS — N182 Chronic kidney disease, stage 2 (mild): Secondary | ICD-10-CM | POA: Diagnosis not present

## 2022-06-24 DIAGNOSIS — R109 Unspecified abdominal pain: Secondary | ICD-10-CM | POA: Diagnosis present

## 2022-06-24 DIAGNOSIS — N132 Hydronephrosis with renal and ureteral calculous obstruction: Secondary | ICD-10-CM | POA: Diagnosis not present

## 2022-06-24 DIAGNOSIS — N201 Calculus of ureter: Secondary | ICD-10-CM

## 2022-06-24 LAB — URINALYSIS, ROUTINE W REFLEX MICROSCOPIC
Bacteria, UA: NONE SEEN
Bilirubin Urine: NEGATIVE
Glucose, UA: NEGATIVE mg/dL
Hgb urine dipstick: NEGATIVE
Ketones, ur: NEGATIVE mg/dL
Leukocytes,Ua: NEGATIVE
Nitrite: NEGATIVE
Protein, ur: 30 mg/dL — AB
Specific Gravity, Urine: 1.027 (ref 1.005–1.030)
pH: 5 (ref 5.0–8.0)

## 2022-06-24 LAB — CBC
HCT: 39.7 % (ref 36.0–46.0)
Hemoglobin: 13 g/dL (ref 12.0–15.0)
MCH: 29.3 pg (ref 26.0–34.0)
MCHC: 32.7 g/dL (ref 30.0–36.0)
MCV: 89.4 fL (ref 80.0–100.0)
Platelets: 407 10*3/uL — ABNORMAL HIGH (ref 150–400)
RBC: 4.44 MIL/uL (ref 3.87–5.11)
RDW: 12.9 % (ref 11.5–15.5)
WBC: 9.8 10*3/uL (ref 4.0–10.5)
nRBC: 0 % (ref 0.0–0.2)

## 2022-06-24 LAB — BASIC METABOLIC PANEL
Anion gap: 11 (ref 5–15)
BUN: 17 mg/dL (ref 6–20)
CO2: 25 mmol/L (ref 22–32)
Calcium: 9.5 mg/dL (ref 8.9–10.3)
Chloride: 103 mmol/L (ref 98–111)
Creatinine, Ser: 1.04 mg/dL — ABNORMAL HIGH (ref 0.44–1.00)
GFR, Estimated: >60 mL/min (ref >60)
Glucose, Bld: 181 mg/dL — ABNORMAL HIGH (ref 70–99)
Potassium: 4.2 mmol/L (ref 3.5–5.1)
Sodium: 139 mmol/L (ref 135–145)

## 2022-06-24 MED ORDER — ONDANSETRON 4 MG PO TBDP: 4.0000 mg | ORAL_TABLET | Freq: Three times a day (TID) | ORAL | 0 refills | Status: DC | PRN

## 2022-06-24 MED ORDER — OXYCODONE-ACETAMINOPHEN 5-325 MG PO TABS: 1.0000 | ORAL_TABLET | Freq: Three times a day (TID) | ORAL | 0 refills | Status: AC | PRN

## 2022-06-24 MED ORDER — ONDANSETRON 4 MG PO TBDP
4.0000 mg | ORAL_TABLET | Freq: Once | ORAL | Status: AC | PRN
  Administered 2022-06-24: 4 mg via ORAL
  Filled 2022-06-24: qty 1

## 2022-06-24 MED ORDER — OXYCODONE-ACETAMINOPHEN 5-325 MG PO TABS
1.0000 | ORAL_TABLET | ORAL | Status: DC | PRN
  Administered 2022-06-24: 1 via ORAL
  Filled 2022-06-24: qty 1

## 2022-06-24 NOTE — ED Notes (Signed)
ED Provider at bedside. 

## 2022-06-24 NOTE — ED Triage Notes (Addendum)
Patient to ED for left side flank pain that started around 3pm with nausea. Hx of kidney stones and feels like same. Patient dry heaving in triage.

## 2022-06-24 NOTE — ED Notes (Addendum)
Pt reports urinating and passing a stone - Provider notified.   Pt to CT.

## 2022-06-24 NOTE — ED Provider Notes (Signed)
[facility] Provider Note    Event Date/Time   First MD Initiated Contact with Patient 06/24/22 2054     (approximate)   History   Chief Complaint Flank Pain   HPI Lisa Rivers is a 51 y.o. female, history of type 2 diabetes, hyperlipidemia, stage II CKD, presents to the emergency department evaluation of left-sided flank pain that started approximately 3 PM earlier today.  Associate with nausea as well.  She states that she has a history of kidney stones and feels like this is very similar.  Denies fever/chills, chest pain, shortness of breath, abdominal pain, hematemesis, hematochezia, diarrhea, headache, weakness, rash/lesions, or dizziness/lightheadedness.  History Limitations: No limitations.        Physical Exam  Triage Vital Signs: ED Triage Vitals  Enc Vitals Group     BP 06/24/22 1843 (!) 159/93     Pulse Rate 06/24/22 1843 97     Resp 06/24/22 1843 18     Temp 06/24/22 1843 98.5 F (36.9 C)     Temp Source 06/24/22 1843 Oral     SpO2 06/24/22 1843 99 %     Weight 06/24/22 1843 180 lb (81.6 kg)     Height 06/24/22 1843 5\' 2"  (1.575 m)     Head Circumference --      Peak Flow --      Pain Score 06/24/22 1845 7     Pain Loc --      Pain Edu? --      Excl. in [place]? --     Most recent vital signs: Vitals:   06/24/22 1843 06/24/22 2119  BP: (!) 159/93 128/71  Pulse: 97 95  Resp: 18 18  Temp: 98.5 F (36.9 C)   SpO2: 99% 98%    General: Awake, NAD.  Skin: Warm, dry. No rashes or lesions.  Eyes: PERRL. Conjunctivae normal.  CV: Good peripheral perfusion.  Resp: Normal effort.  Abd: Soft, non-tender. No distention.  Neuro: At baseline. No gross neurological deficits.  Musculoskeletal: Normal ROM of all extremities.  Focused Exam: Left-sided CVA tenderness present.  Physical Exam    ED Results / Procedures / Treatments  Labs (all labs ordered are listed, but only abnormal results are displayed) Labs Reviewed   URINALYSIS, ROUTINE W REFLEX MICROSCOPIC - Abnormal; Notable for the following components:      Result Value   Color, Urine YELLOW (*)    APPearance HAZY (*)    Protein, ur 30 (*)    All other components within normal limits  BASIC METABOLIC PANEL - Abnormal; Notable for the following components:   Glucose, Bld 181 (*)    Creatinine, Ser 1.04 (*)    All other components within normal limits  CBC - Abnormal; Notable for the following components:   Platelets 407 (*)    All other components within normal limits  PREGNANCY, URINE     EKG N/A.    RADIOLOGY  ED Provider Interpretation: I personally viewed and interpreted the CT scan, mild left hydronephrosis with asymmetric left perinephric fat stranding.  CT Renal Stone Study  Result Date: 06/24/2022 CLINICAL DATA:  Left-sided flank pain EXAM: CT ABDOMEN AND PELVIS WITHOUT CONTRAST TECHNIQUE: Multidetector CT imaging of the abdomen and pelvis was performed following the standard protocol without IV contrast. RADIATION DOSE REDUCTION: This exam was performed according to the departmental dose-optimization program which includes automated exposure control, adjustment of the mA and/or kV according to patient size and/or use of iterative reconstruction  technique. COMPARISON:  CT abdomen and pelvis dated October 11, 2021 FINDINGS: Lower chest: Small hiatal hernia.  No acute abnormality. Hepatobiliary: Hepatic steatosis. No focal liver abnormality. Gallbladder is unremarkable. No biliary ductal dilation. Pancreas: Unremarkable. No pancreatic ductal dilatation or surrounding inflammatory changes. Spleen: Normal in size without focal abnormality. Adrenals/Urinary Tract: Bilateral adrenal glands are unremarkable. Mild left hydronephrosis and asymmetric left perinephric fat stranding. No right hydronephrosis. No nephroureterolithiasis. Bladder is decompressed. Stomach/Bowel: Stomach is within normal limits. No evidence of bowel wall thickening,  distention, or inflammatory changes. Vascular/Lymphatic: No significant vascular findings are present. No enlarged abdominal or pelvic lymph nodes. Reproductive: Uterus and bilateral adnexa are unremarkable. Other: No abdominal wall hernia or abnormality. No abdominopelvic ascites. Musculoskeletal: No acute or significant osseous findings. IMPRESSION: 1. Mild left hydronephrosis and asymmetric left perinephric fat stranding. No nephroureterolithiasis. Findings could reflect recently passed stone or infection. Correlate with urinalysis. 2. Hepatic steatosis. Electronically Signed   By: Yetta Glassman M.D.   On: 06/24/2022 21:41    PROCEDURES:  Critical Care performed: N/A.  Procedures    MEDICATIONS ORDERED IN ED: Medications  oxyCODONE-acetaminophen (PERCOCET/ROXICET) 5-325 MG per tablet 1 tablet (1 tablet Oral Given 06/24/22 1921)  ondansetron (ZOFRAN-ODT) disintegrating tablet 4 mg (4 mg Oral Given 06/24/22 1850)     IMPRESSION / MDM / ASSESSMENT AND PLAN / ED COURSE  I reviewed the triage vital signs and the nursing notes.                              Differential diagnosis includes, but is not limited to, cystitis, pyelonephritis, ureterolithiasis, hydronephrosis, shingles, musculoskeletal strain.  ED Course Patient appears well, vitals within normal limits.  Currently worsening pain and nausea.  Treated here with ondansetron and oxycodone/CMFN.  CBC shows no leukocytosis, anemia, or thrombocytopenia.  BMP shows a mildly elevated creatinine at 1.04, otherwise no electrolyte abnormalities.  Urinalysis shows mild proteinuria, otherwise no signs of hematuria or infection.  Assessment/Plan Patient presents with left-sided flank pain x 1 day.  Urinalysis shows no evidence of infection.  Her CT scan does show left perinephric fat stranding, consistent with possible infection versus recently passed stone.  Just prior to her CT scan, she did state that she passed a small stone.  I  suspect that this is likely the etiology of the CT findings.  She appears well clinically at this time.  Pain and nausea controlled.  Will provide her with prescription for medications to calm and manage her symptoms that she continues to recover.  Provided her with a referral to urology as well to follow-up with if she continues to have recurrent kidney stones.  She was amenable to this plan.  Will discharge.  Considered admission for this patient, but given her stable presentation and unremarkable findings, she is unlikely benefit from admission.  Provided the patient with anticipatory guidance, return precautions, and educational material. Encouraged the patient to return to the emergency department at any time if they begin to experience any new or worsening symptoms. Patient expressed understanding and agreed with the plan.   Patient's presentation is most consistent with acute complicated illness / injury requiring diagnostic workup.       FINAL CLINICAL IMPRESSION(S) / ED DIAGNOSES   Final diagnoses:  Ureterolithiasis     Rx / DC Orders   ED Discharge Orders          Ordered    oxyCODONE-acetaminophen (PERCOCET) 5-325 MG tablet  Every 8 hours PRN        06/24/22 2207    ondansetron (ZOFRAN-ODT) 4 MG disintegrating tablet  Every 8 hours PRN        06/24/22 2207             Note:  This document was prepared using Dragon voice recognition software and may include unintentional dictation errors.   Varney Daily, [place] 06/24/22 2209    Dionne Bucy, MD 06/24/22 [phone]

## 2022-06-24 NOTE — ED Notes (Signed)
Pt back from CT

## 2022-06-24 NOTE — Discharge Instructions (Addendum)
-  Your pain should improve over the next few days.  In the meantime, you may take oxycodone/acetaminophen as needed for the pain.  Be careful though as it may cause you to become dizzy/drowsy.  You may also be addicting.  -You may additionally take ondansetron as needed for nausea/vomiting.  -If you continue to have frequent occurrences of kidney stones, please follow-up with the urologist listed in these instructions.  -Return to the emergency department anytime if you begin to experience any new or worsening symptoms.

## 2022-06-25 LAB — PREGNANCY, URINE: Preg Test, Ur: NEGATIVE

## 2022-07-08 ENCOUNTER — Other Ambulatory Visit: Payer: Self-pay | Admitting: Family Medicine

## 2022-07-08 DIAGNOSIS — M5442 Lumbago with sciatica, left side: Secondary | ICD-10-CM

## 2022-07-08 NOTE — Telephone Encounter (Signed)
LMTCB and schedule appt if RX is needed due to already on another medication to treat the same thing.

## 2022-07-08 NOTE — Telephone Encounter (Signed)
LOV -06/15/22 NOV -10/20/22 LR- Discontinued

## 2022-07-08 NOTE — Telephone Encounter (Signed)
Requested medication (s) are due for refill today - no  Requested medication (s) are on the active medication list -no  Future visit scheduled -yes  Last refill: 06/15/21  Notes to clinic: non delegated Rx- no longer listed on current medication list  Requested Prescriptions  Pending Prescriptions Disp Refills   cyclobenzaprine (FLEXERIL) 5 MG tablet [Pharmacy Med Name: CYCLOBENZAPRINE 5MG  TABLETS] 30 tablet 5    Sig: TAKE 1 TABLET(5 MG) BY MOUTH THREE TIMES DAILY AS NEEDED FOR MUSCLE SPASMS     Not Delegated - Analgesics:  Muscle Relaxants Failed - 07/08/2022  3:18 AM      Failed - This refill cannot be delegated      Passed - Valid encounter within last 6 months    Recent Outpatient Visits           3 weeks ago Left low back pain, unspecified chronicity, unspecified whether sciatica present   [facility] Gwyneth Sprout, FNP   2 months ago Encounter for health maintenance examination   [facility] Tally Joe T, FNP   4 months ago Epidermal inclusion cyst   [facility] Myles Gip, DO   5 months ago Boil of buttock   [facility] Rory Percy M, DO   8 months ago Type 2 diabetes mellitus with hyperglycemia, without long-term current use of insulin [facility])   [facility] Gwyneth Sprout, FNP       Future Appointments             In 3 months Gwyneth Sprout, [place], [place]               Requested Prescriptions  Pending Prescriptions Disp Refills   cyclobenzaprine (FLEXERIL) 5 MG tablet [Pharmacy Med Name: CYCLOBENZAPRINE 5MG  TABLETS] 30 tablet 5    Sig: TAKE 1 TABLET(5 MG) BY MOUTH THREE TIMES DAILY AS NEEDED FOR MUSCLE SPASMS     Not Delegated - Analgesics:  Muscle Relaxants Failed - 07/08/2022  3:18 AM      Failed - This refill cannot be delegated      Passed - Valid encounter within last 6 months    Recent Outpatient Visits           3 weeks ago Left low back  pain, unspecified chronicity, unspecified whether sciatica present   [facility] Gwyneth Sprout, FNP   2 months ago Encounter for health maintenance examination   [facility] Tally Joe T, FNP   4 months ago Epidermal inclusion cyst   [facility] Myles Gip, DO   5 months ago Boil of buttock   [facility] Myles Gip, DO   8 months ago Type 2 diabetes mellitus with hyperglycemia, without long-term current use of insulin [facility])   [facility] Gwyneth Sprout, FNP       Future Appointments             In 3 months Gwyneth Sprout, [place], [facility]

## 2022-07-15 ENCOUNTER — Telehealth: Payer: Self-pay | Admitting: Family Medicine

## 2022-07-15 NOTE — Telephone Encounter (Signed)
[facility] faxed refill request for the following medications:   simvastatin (ZOCOR) 20 MG tablet   Please advise.  

## 2022-07-16 ENCOUNTER — Ambulatory Visit
Admission: RE | Admit: 2022-07-16 | Discharge: 2022-07-16 | Disposition: A | Discharge status: Home / Self Care | Payer: 59 | Source: Ambulatory Visit | Attending: Family Medicine | Admitting: Family Medicine

## 2022-07-16 ENCOUNTER — Encounter: Payer: Self-pay | Admitting: Radiology

## 2022-07-16 DIAGNOSIS — Z1231 Encounter for screening mammogram for malignant neoplasm of breast: Secondary | ICD-10-CM | POA: Insufficient documentation

## 2022-07-16 NOTE — Telephone Encounter (Signed)
Refill requested too soon. Last refill 90 w 1 04/2022.  KP

## 2022-07-20 NOTE — Progress Notes (Signed)
Cindy,  Normal mammogram; repeat in 1 year.  Please let us know if you have any questions.  Thank you,  Tally Joe, FNP

## 2022-08-14 ENCOUNTER — Other Ambulatory Visit: Payer: Self-pay | Admitting: Family Medicine

## 2022-08-16 ENCOUNTER — Other Ambulatory Visit: Payer: Self-pay | Admitting: Family Medicine

## 2022-08-16 NOTE — Telephone Encounter (Signed)
Requested Prescriptions  Pending Prescriptions Disp Refills   glipiZIDE (GLUCOTROL) 10 MG tablet [Pharmacy Med Name: GLIPIZIDE 10MG TABLETS] 60 tablet 1    Sig: TAKE 1 TABLET(10 MG) BY MOUTH TWICE DAILY BEFORE A MEAL     Endocrinology:  Diabetes - Sulfonylureas Failed - 08/14/2022  7:04 AM      Failed - HBA1C is between 0 and 7.9 and within 180 days    Hgb A1c MFr Bld  Date Value Ref Range Status  04/21/2022 8.3 (H) 4.8 - 5.6 % Final    Comment:             Prediabetes: 5.7 - 6.4          Diabetes: >6.4          Glycemic control for adults with diabetes: <7.0          Failed - Cr in normal range and within 360 days    Creatinine  Date Value Ref Range Status  12/31/2013 1.08 0.60 - 1.30 mg/dL Final   Creatinine, Ser  Date Value Ref Range Status  06/24/2022 1.04 (H) 0.44 - 1.00 mg/dL Final         Passed - Valid encounter within last 6 months    Recent Outpatient Visits           2 months ago Left low back pain, unspecified chronicity, unspecified whether sciatica present   [facility] Gwyneth Sprout, FNP   3 months ago Encounter for health maintenance examination   [facility] Tally Joe T, FNP   6 months ago Epidermal inclusion cyst   [facility] Rory Percy M, DO   6 months ago Boil of buttock   [facility] Rory Percy M, DO   9 months ago Type 2 diabetes mellitus with hyperglycemia, without long-term current use of insulin [facility])   [place] Gwyneth Sprout, FNP       Future Appointments             In 2 months Gwyneth Sprout, [place], [facility]

## 2022-09-21 LAB — HM DEXA SCAN

## 2022-09-24 ENCOUNTER — Other Ambulatory Visit: Payer: Self-pay | Admitting: Orthopedic Surgery

## 2022-09-24 DIAGNOSIS — M5416 Radiculopathy, lumbar region: Secondary | ICD-10-CM

## 2022-10-05 ENCOUNTER — Ambulatory Visit
Admission: RE | Admit: 2022-10-05 | Discharge: 2022-10-05 | Disposition: A | Discharge status: Home / Self Care | Payer: 59 | Source: Ambulatory Visit | Attending: Orthopedic Surgery | Admitting: Orthopedic Surgery

## 2022-10-05 DIAGNOSIS — M5416 Radiculopathy, lumbar region: Secondary | ICD-10-CM | POA: Diagnosis not present

## 2022-10-13 ENCOUNTER — Other Ambulatory Visit: Payer: Self-pay | Admitting: Family Medicine

## 2022-10-13 DIAGNOSIS — E1165 Type 2 diabetes mellitus with hyperglycemia: Secondary | ICD-10-CM

## 2022-10-14 ENCOUNTER — Encounter: Payer: Self-pay | Admitting: Physician Assistant

## 2022-10-14 ENCOUNTER — Ambulatory Visit: Payer: 59 | Admitting: Physician Assistant

## 2022-10-14 VITALS — BP 154/86 | HR 125 | Ht 62.0 in | Wt 186.2 lb

## 2022-10-14 DIAGNOSIS — E1159 Type 2 diabetes mellitus with other circulatory complications: Secondary | ICD-10-CM | POA: Diagnosis not present

## 2022-10-14 DIAGNOSIS — I152 Hypertension secondary to endocrine disorders: Secondary | ICD-10-CM | POA: Diagnosis not present

## 2022-10-14 DIAGNOSIS — S70362A Insect bite (nonvenomous), left thigh, initial encounter: Secondary | ICD-10-CM

## 2022-10-14 DIAGNOSIS — W57XXXA Bitten or stung by nonvenomous insect and other nonvenomous arthropods, initial encounter: Secondary | ICD-10-CM

## 2022-10-14 MED ORDER — DOXYCYCLINE HYCLATE 100 MG PO TABS: 200.0000 mg | ORAL_TABLET | Freq: Every day | ORAL | 0 refills | Status: DC

## 2022-10-14 NOTE — Assessment & Plan Note (Signed)
Pt is concerned elevated pressure will prevent her from having surgery Advised her to check at home, significant stress and pain level today

## 2022-10-14 NOTE — Progress Notes (Signed)
I,Sha'taria Tyson,acting as a Neurosurgeon for [employer], PA-C.,have documented all relevant documentation on the behalf of Alfredia Ferguson, PA-C,as directed by  Alfredia Ferguson, PA-C while in the presence of Alfredia Ferguson, PA-C.   Established patient visit   Patient: Lisa Rivers   DOB: 1971/01/30   52 y.o. Female  MRN: [id number] Visit Date: 10/14/2022  Today's healthcare provider: Alfredia Ferguson, PA-C   Cc. Tick bite  Subjective    HPI   Pt reports being bit by a tick today 10/14/22 . Reports two bites on her upper L leg that are red and itchy. She found the tick and removed it, brings it with her today. Reports history of Lyme and [place] Fever.   Medications: Outpatient Medications Prior to Visit  Medication Sig   albuterol (VENTOLIN HFA) 108 (90 Base) MCG/ACT inhaler Inhale 2 puffs into the lungs every 6 (six) hours as needed for wheezing or shortness of breath.   b complex vitamins capsule Take 1 capsule by mouth daily.   Cholecalciferol (VITAMIN D PO) Take 2 tablets by mouth daily. 1500 iu per tablet   fluticasone (FLONASE) 50 MCG/ACT nasal spray SHAKE LIQUID AND USE 2 SPRAYS IN EACH NOSTRIL DAILY AS NEEDED   glipiZIDE (GLUCOTROL) 10 MG tablet TAKE 1 TABLET(10 MG) BY MOUTH TWICE DAILY BEFORE A MEAL   hydrocortisone cream 0.5 % Apply topically 2 (two) times daily.   metFORMIN (GLUCOPHAGE) 1000 MG tablet TAKE 1 TABLET(1000 MG) BY MOUTH TWICE DAILY WITH A MEAL   Multiple Vitamin (MULTIVITAMIN WITH MINERALS) TABS tablet Take 1 tablet by mouth daily.   simvastatin (ZOCOR) 20 MG tablet TAKE 1 TABLET(20 MG) BY MOUTH DAILY AT 6 PM   methocarbamol (ROBAXIN-750) 750 MG tablet Take 1 tablet (750 mg total) by mouth every 8 (eight) hours as needed for muscle spasms. (Patient not taking: Reported on 10/14/2022)   ondansetron (ZOFRAN-ODT) 4 MG disintegrating tablet Take 1 tablet (4 mg total) by mouth every 8 (eight) hours as needed for nausea or vomiting. (Patient not taking: Reported  on 10/14/2022)   [DISCONTINUED] sulfamethoxazole-trimethoprim (BACTRIM DS) 800-160 MG tablet Take 1 tablet by mouth 2 (two) times daily. (Patient not taking: Reported on 10/14/2022)   No facility-administered medications prior to visit.    Review of Systems  Constitutional:  Negative for fatigue and fever.  Respiratory:  Negative for cough and shortness of breath.   Cardiovascular:  Negative for chest pain and leg swelling.  Gastrointestinal:  Negative for abdominal pain.  Neurological:  Negative for dizziness and headaches.      Objective    BP (!) 154/86 (BP Location: Right Arm, Patient Position: Sitting, Cuff Size: Normal)   Pulse (!) 125   Ht 5\' 2"  (1.575 m)   Wt 186 lb 3.2 oz (84.5 kg)   SpO2 100%   BMI 34.06 kg/m   Physical Exam Vitals reviewed.  Constitutional:      Appearance: She is not ill-appearing.  HENT:     Head: Normocephalic.  Eyes:     Conjunctiva/sclera: Conjunctivae normal.  Cardiovascular:     Rate and Rhythm: Normal rate.  Pulmonary:     Effort: Pulmonary effort is normal. No respiratory distress.  Neurological:     General: No focal deficit present.     Mental Status: She is alert and oriented to person, place, and time.  Psychiatric:        Mood and Affect: Mood normal.        Behavior: Behavior  normal.    Pt did not remove pants for me to see the bites.  No results found for any visits on 10/14/22.  Assessment & Plan     Tick bite Given history, pt concern; will tx preventatively for lyme with doxycycline 200 mg x 1 dose.  Problem List Items Addressed This Visit       Cardiovascular and Mediastinum   Hypertension associated with diabetes    Pt is concerned elevated pressure will prevent her from having surgery Advised her to check at home, significant stress and pain level today        Other Visit Diagnoses     Tick bite of left thigh, initial encounter    -  Primary   Relevant Medications   doxycycline (VIBRA-TABS) 100 MG  tablet       Return if symptoms worsen or fail to improve.      I, Alfredia Ferguson, PA-C have reviewed all documentation for this visit. The documentation on  10/14/22  for the exam, diagnosis, procedures, and orders are all accurate and complete.  Alfredia Ferguson, PA-C [facility] [address] [place], [place], [address] Office: [phone] Fax: [phone]   [facility]

## 2022-10-20 ENCOUNTER — Ambulatory Visit: Payer: 59 | Admitting: Family Medicine

## 2022-11-12 ENCOUNTER — Other Ambulatory Visit: Payer: Self-pay | Admitting: Family Medicine

## 2022-11-12 NOTE — Telephone Encounter (Signed)
Refilled today 11/12/22 pt is in need of lab work - longer RF no appropriate-   Requested Prescriptions  Refused Prescriptions Disp Refills   glipiZIDE (GLUCOTROL) 10 MG tablet [Pharmacy Med Name: GLIPIZIDE 10MG  TABLETS] 180 tablet     Sig: TAKE 1 TABLET(10 MG) BY MOUTH TWICE DAILY BEFORE A MEAL     Endocrinology:  Diabetes - Sulfonylureas Failed - 11/12/2022 11:42 AM      Failed - HBA1C is between 0 and 7.9 and within 180 days    Hgb A1c MFr Bld  Date Value Ref Range Status  04/21/2022 8.3 (H) 4.8 - 5.6 % Final    Comment:             Prediabetes: 5.7 - 6.4          Diabetes: >6.4          Glycemic control for adults with diabetes: <7.0          Failed - Cr in normal range and within 360 days    Creatinine  Date Value Ref Range Status  12/31/2013 1.08 0.60 - 1.30 mg/dL Final   Creatinine, Ser  Date Value Ref Range Status  06/24/2022 1.04 (H) 0.44 - 1.00 mg/dL Final         Passed - Valid encounter within last 6 months    Recent Outpatient Visits           4 weeks ago Tick bite of left thigh, initial encounter   [facility] Alfredia Ferguson, PA-C   5 months ago Left low back pain, unspecified chronicity, unspecified whether sciatica present   [facility] Jacky Kindle, FNP   6 months ago Encounter for health maintenance examination   [facility] Jacky Kindle, FNP   9 months ago Epidermal inclusion cyst   [facility] Caro Laroche, DO   9 months ago Boil of buttock   [facility] Caro Laroche, [place]

## 2022-11-12 NOTE — Telephone Encounter (Signed)
Requested Prescriptions  Pending Prescriptions Disp Refills   glipiZIDE (GLUCOTROL) 10 MG tablet [Pharmacy Med Name: GLIPIZIDE 10MG  TABLETS] 60 tablet 0    Sig: TAKE 1 TABLET(10 MG) BY MOUTH TWICE DAILY BEFORE A MEAL     Endocrinology:  Diabetes - Sulfonylureas Failed - 11/12/2022  7:04 AM      Failed - HBA1C is between 0 and 7.9 and within 180 days    Hgb A1c MFr Bld  Date Value Ref Range Status  04/21/2022 8.3 (H) 4.8 - 5.6 % Final    Comment:             Prediabetes: 5.7 - 6.4          Diabetes: >6.4          Glycemic control for adults with diabetes: <7.0          Failed - Cr in normal range and within 360 days    Creatinine  Date Value Ref Range Status  12/31/2013 1.08 0.60 - 1.30 mg/dL Final   Creatinine, Ser  Date Value Ref Range Status  06/24/2022 1.04 (H) 0.44 - 1.00 mg/dL Final         Passed - Valid encounter within last 6 months    Recent Outpatient Visits           4 weeks ago Tick bite of left thigh, initial encounter   [facility] Alfredia Ferguson, PA-C   5 months ago Left low back pain, unspecified chronicity, unspecified whether sciatica present   [facility] Jacky Kindle, FNP   6 months ago Encounter for health maintenance examination   [facility] Jacky Kindle, FNP   9 months ago Epidermal inclusion cyst   [facility] Caro Laroche, DO   9 months ago Boil of buttock   [facility] Caro Laroche, [place]

## 2022-11-24 LAB — HM DIABETES EYE EXAM

## 2022-12-02 ENCOUNTER — Encounter: Payer: Self-pay | Admitting: *Deleted

## 2022-12-12 ENCOUNTER — Other Ambulatory Visit: Payer: Self-pay | Admitting: Family Medicine

## 2022-12-13 NOTE — Telephone Encounter (Signed)
Requested Prescriptions  Pending Prescriptions Disp Refills   glipiZIDE (GLUCOTROL) 10 MG tablet [Pharmacy Med Name: GLIPIZIDE 10MG  TABLETS] 180 tablet 0    Sig: TAKE 1 TABLET(10 MG) BY MOUTH TWICE DAILY BEFORE A MEAL     Endocrinology:  Diabetes - Sulfonylureas Failed - 12/12/2022  7:05 AM      Failed - HBA1C is between 0 and 7.9 and within 180 days    Hgb A1c MFr Bld  Date Value Ref Range Status  04/21/2022 8.3 (H) 4.8 - 5.6 % Final    Comment:             Prediabetes: 5.7 - 6.4          Diabetes: >6.4          Glycemic control for adults with diabetes: <7.0          Failed - Cr in normal range and within 360 days    Creatinine  Date Value Ref Range Status  12/31/2013 1.08 0.60 - 1.30 mg/dL Final   Creatinine, Ser  Date Value Ref Range Status  06/24/2022 1.04 (H) 0.44 - 1.00 mg/dL Final         Passed - Valid encounter within last 6 months    Recent Outpatient Visits           2 months ago Tick bite of left thigh, initial encounter   [facility] Alfredia Ferguson, PA-C   6 months ago Left low back pain, unspecified chronicity, unspecified whether sciatica present   [facility] Jacky Kindle, FNP   7 months ago Encounter for health maintenance examination   [facility] Jacky Kindle, FNP   10 months ago Epidermal inclusion cyst   [facility] [place] M, DO   10 months ago Boil of buttock   [facility] Caro Lar[place], [place]

## 2023-01-11 ENCOUNTER — Other Ambulatory Visit: Payer: Self-pay | Admitting: Family Medicine

## 2023-01-11 DIAGNOSIS — E1165 Type 2 diabetes mellitus with hyperglycemia: Secondary | ICD-10-CM

## 2023-01-11 NOTE — Telephone Encounter (Signed)
Requested Prescriptions  Pending Prescriptions Disp Refills   metFORMIN (GLUCOPHAGE) 1000 MG tablet [Pharmacy Med Name: METFORMIN 1000MG  TABLETS] 180 tablet 0    Sig: TAKE 1 TABLET(1000 MG) BY MOUTH TWICE DAILY WITH A MEAL     Endocrinology:  Diabetes - Biguanides Failed - 01/11/2023  7:35 AM      Failed - Cr in normal range and within 360 days    Creatinine  Date Value Ref Range Status  12/31/2013 1.08 0.60 - 1.30 mg/dL Final   Creatinine, Ser  Date Value Ref Range Status  06/24/2022 1.04 (H) 0.44 - 1.00 mg/dL Final         Failed - HBA1C is between 0 and 7.9 and within 180 days    Hgb A1c MFr Bld  Date Value Ref Range Status  04/21/2022 8.3 (H) 4.8 - 5.6 % Final    Comment:             Prediabetes: 5.7 - 6.4          Diabetes: >6.4          Glycemic control for adults with diabetes: <7.0          Failed - B12 Level in normal range and within 720 days    No results found for: "VITAMINB12"       Failed - CBC within normal limits and completed in the last 12 months    WBC  Date Value Ref Range Status  06/24/2022 9.8 4.0 - 10.5 K/uL Final   RBC  Date Value Ref Range Status  06/24/2022 4.44 3.87 - 5.11 MIL/uL Final   Hemoglobin  Date Value Ref Range Status  06/24/2022 13.0 12.0 - 15.0 g/dL Final  16/04/9603 54.0 11.1 - 15.9 g/dL Final   HCT  Date Value Ref Range Status  06/24/2022 39.7 36.0 - 46.0 % Final   Hematocrit  Date Value Ref Range Status  04/21/2022 42.4 34.0 - 46.6 % Final   MCHC  Date Value Ref Range Status  06/24/2022 32.7 30.0 - 36.0 g/dL Final   [facility]  Date Value Ref Range Status  06/24/2022 29.3 26.0 - 34.0 pg Final   MCV  Date Value Ref Range Status  06/24/2022 89.4 80.0 - 100.0 fL Final  04/21/2022 89 79 - 97 fL Final  12/31/2013 89 80 - 100 fL Final   No results found for: "PLTCOUNTKUC", "LABPLAT", "POCPLA" RDW  Date Value Ref Range Status  06/24/2022 12.9 11.5 - 15.5 % Final  04/21/2022 12.2 11.7 - 15.4 % Final  12/31/2013 12.7 11.5  - 14.5 % Final         Passed - eGFR in normal range and within 360 days    EGFR (African American)  Date Value Ref Range Status  12/31/2013 >60  Final   GFR calc Af Amer  Date Value Ref Range Status  03/23/2020 >60 >60 mL/min Final   EGFR (Non-African Amer.)  Date Value Ref Range Status  12/31/2013 >60  Final    Comment:    eGFR values <23mL/min/1.73 m2 may be an indication of chronic kidney disease (CKD). Calculated eGFR is useful in patients with stable renal function. The eGFR calculation will not be reliable in acutely ill patients when serum creatinine is changing rapidly. It is not useful in  patients on dialysis. The eGFR calculation may not be applicable to patients at the low and high extremes of body sizes, pregnant women, and vegetarians.    GFR, Estimated  Date Value Ref Range  Status  06/24/2022 >60 >60 mL/min Final    Comment:    (NOTE) Calculated using the CKD-EPI Creatinine Equation (2021)    eGFR  Date Value Ref Range Status  04/21/2022 85 >59 mL/min/1.73 Final         Passed - Valid encounter within last 6 months    Recent Outpatient Visits           2 months ago Tick bite of left thigh, initial encounter   [facility] Alfredia Ferguson, PA-C   7 months ago Left low back pain, unspecified chronicity, unspecified whether sciatica present   [facility] Jacky Kindle, FNP   8 months ago Encounter for health maintenance examination   [facility] Jacky Kindle, FNP   11 months ago Epidermal inclusion cyst   [facility] Caro Laroche, DO   11 months ago Boil of buttock   [facility] Caro Laroche, [place]

## 2023-02-02 ENCOUNTER — Telehealth: Payer: Self-pay | Admitting: Family Medicine

## 2023-02-02 NOTE — Telephone Encounter (Signed)
[facility] pharmacy faxed refill request for the following medications:   simvastatin (ZOCOR) 20 MG tablet   glipiZIDE (GLUCOTROL) 10 MG tablet    metFORMIN (GLUCOPHAGE) 1000 MG tablet    Please advise

## 2023-02-02 NOTE — Telephone Encounter (Signed)
Statin 05/04/22 #90 1RF Glipizide 12/13/22 #180  Metformin 01/11/23 #180  Patient scheduled to be seen tomorrow

## 2023-02-03 ENCOUNTER — Ambulatory Visit (INDEPENDENT_AMBULATORY_CARE_PROVIDER_SITE_OTHER): Payer: 59 | Admitting: Family Medicine

## 2023-02-03 ENCOUNTER — Encounter: Payer: Self-pay | Admitting: Family Medicine

## 2023-02-03 VITALS — BP 139/81 | HR 95 | Ht 62.0 in | Wt 200.0 lb

## 2023-02-03 DIAGNOSIS — E1165 Type 2 diabetes mellitus with hyperglycemia: Secondary | ICD-10-CM

## 2023-02-03 DIAGNOSIS — Z1211 Encounter for screening for malignant neoplasm of colon: Secondary | ICD-10-CM | POA: Diagnosis not present

## 2023-02-03 DIAGNOSIS — E1169 Type 2 diabetes mellitus with other specified complication: Secondary | ICD-10-CM

## 2023-02-03 DIAGNOSIS — E785 Hyperlipidemia, unspecified: Secondary | ICD-10-CM

## 2023-02-03 DIAGNOSIS — G8928 Other chronic postprocedural pain: Secondary | ICD-10-CM

## 2023-02-03 MED ORDER — SIMVASTATIN 20 MG PO TABS: 20.0000 mg | ORAL_TABLET | Freq: Every day | ORAL | 3 refills | Status: DC

## 2023-02-03 MED ORDER — CYCLOBENZAPRINE HCL 10 MG PO TABS: 10.0000 mg | ORAL_TABLET | Freq: Three times a day (TID) | ORAL | 3 refills | Status: DC | PRN

## 2023-02-03 MED ORDER — METFORMIN HCL 1000 MG PO TABS: 1000.0000 mg | ORAL_TABLET | Freq: Two times a day (BID) | ORAL | 3 refills | Status: DC

## 2023-02-03 MED ORDER — GLIPIZIDE 10 MG PO TABS: 10.0000 mg | ORAL_TABLET | Freq: Two times a day (BID) | ORAL | 3 refills | Status: DC

## 2023-02-03 NOTE — Assessment & Plan Note (Signed)
Chronic, stable per pt report; had labs done prior to surgery in April with A1c 5.8% Repeat urine and A1c Continue to recommend balanced, lower carb meals. Smaller meal size, adding snacks. Choosing water as drink of choice and increasing purposeful exercise. Refills meds Glipizide 10 mg BID and metformin 1000 mg BID

## 2023-02-03 NOTE — Assessment & Plan Note (Signed)
S/p back sx; still within limitations in ROM Request for refill of flexeril

## 2023-02-03 NOTE — Assessment & Plan Note (Signed)
Chronic, at goal Repeat LP Refill zocor 20 mg to assist lifestyle recommend diet low in saturated fat and regular exercise - 30 min at least 5 times per week

## 2023-02-03 NOTE — Patient Instructions (Addendum)
The CDC recommends two doses of Shingrix (the new shingles vaccine) separated by 2 to 6 months for adults age 54 years and older. I recommend checking with your insurance plan regarding coverage for this vaccine.    Preventive Care 58-24 Years Old, Female Preventive care refers to lifestyle choices and visits with your health care provider that can promote health and wellness. Preventive care visits are also called wellness exams. What can I expect for my preventive care visit? Counseling Your health care provider may ask you questions about your: Medical history, including: Past medical problems. Family medical history. Pregnancy history. Current health, including: Menstrual cycle. Method of birth control. Emotional well-being. Home life and relationship well-being. Sexual activity and sexual health. Lifestyle, including: Alcohol, nicotine or tobacco, and drug use. Access to firearms. Diet, exercise, and sleep habits. Work and work Astronomer. Sunscreen use. Safety issues such as seatbelt and bike helmet use. Physical exam Your health care provider will check your: Height and weight. These may be used to calculate your BMI (body mass index). BMI is a measurement that tells if you are at a healthy weight. Waist circumference. This measures the distance around your waistline. This measurement also tells if you are at a healthy weight and may help predict your risk of certain diseases, such as type 2 diabetes and high blood pressure. Heart rate and blood pressure. Body temperature. Skin for abnormal spots. What immunizations do I need?  Vaccines are usually given at various ages, according to a schedule. Your health care provider will recommend vaccines for you based on your age, medical history, and lifestyle or other factors, such as travel or where you work. What tests do I need? Screening Your health care provider may recommend screening tests for certain conditions. This may  include: Lipid and cholesterol levels. Diabetes screening. This is done by checking your blood sugar (glucose) after you have not eaten for a while (fasting). Pelvic exam and Pap test. Hepatitis B test. Hepatitis C test. HIV (human immunodeficiency virus) test. STI (sexually transmitted infection) testing, if you are at risk. Lung cancer screening. Colorectal cancer screening. Mammogram. Talk with your health care provider about when you should start having regular mammograms. This may depend on whether you have a family history of breast cancer. BRCA-related cancer screening. This may be done if you have a family history of breast, ovarian, tubal, or peritoneal cancers. Bone density scan. This is done to screen for osteoporosis. Talk with your health care provider about your test results, treatment options, and if necessary, the need for more tests. Follow these instructions at home: Eating and drinking  Eat a diet that includes fresh fruits and vegetables, whole grains, lean protein, and low-fat dairy products. Take vitamin and mineral supplements as recommended by your health care provider. Do not drink alcohol if: Your health care provider tells you not to drink. You are pregnant, may be pregnant, or are planning to become pregnant. If you drink alcohol: Limit how much you have to 0-1 drink a day. Know how much alcohol is in your drink. In the [place] one drink equals one 12 oz bottle of beer (355 mL), one 5 oz glass of wine (148 mL), or one 1 oz glass of hard liquor (44 mL). Lifestyle Brush your teeth every morning and night with fluoride toothpaste. Floss one time each day. Exercise for at least 30 minutes 5 or more days each week. Do not use any products that contain nicotine or tobacco. These products include cigarettes,  chewing tobacco, and vaping devices, such as e-cigarettes. If you need help quitting, ask your health care provider. Do not use drugs. If you are sexually  active, practice safe sex. Use a condom or other form of protection to prevent STIs. If you do not wish to become pregnant, use a form of birth control. If you plan to become pregnant, see your health care provider for a prepregnancy visit. Take aspirin only as told by your health care provider. Make sure that you understand how much to take and what form to take. Work with your health care provider to find out whether it is safe and beneficial for you to take aspirin daily. Find healthy ways to manage stress, such as: Meditation, yoga, or listening to music. Journaling. Talking to a trusted person. Spending time with friends and family. Minimize exposure to UV radiation to reduce your risk of skin cancer. Safety Always wear your seat belt while driving or riding in a vehicle. Do not drive: If you have been drinking alcohol. Do not ride with someone who has been drinking. When you are tired or distracted. While texting. If you have been using any mind-altering substances or drugs. Wear a helmet and other protective equipment during sports activities. If you have firearms in your house, make sure you follow all gun safety procedures. Seek help if you have been physically or sexually abused. What's next? Visit your health care provider once a year for an annual wellness visit. Ask your health care provider how often you should have your eyes and teeth checked. Stay up to date on all vaccines. This information is not intended to replace advice given to you by your health care provider. Make sure you discuss any questions you have with your health care provider. Document Revised: 12/17/2020 Document Reviewed: 12/17/2020 Elsevier Patient Education  2024 [employer].

## 2023-02-03 NOTE — Assessment & Plan Note (Signed)
Due for repeat colo guard  

## 2023-02-03 NOTE — Progress Notes (Signed)
Established patient visit   Patient: Lisa Rivers   DOB: [birth year]   52 y.o. Female  MRN: [id number] Visit Date: 02/03/2023  Today's healthcare provider: Jacky Kindle, FNP  Introduced to nurse practitioner role and practice setting.  All questions answered.  Discussed provider/patient relationship and expectations.  Subjective    HPI  6 month f/u  Medications: Outpatient Medications Prior to Visit  Medication Sig   albuterol (VENTOLIN HFA) 108 (90 Base) MCG/ACT inhaler Inhale 2 puffs into the lungs every 6 (six) hours as needed for wheezing or shortness of breath.   b complex vitamins capsule Take 1 capsule by mouth daily.   Cholecalciferol (VITAMIN D PO) Take 2 tablets by mouth daily. 1500 iu per tablet   fluticasone (FLONASE) 50 MCG/ACT nasal spray SHAKE LIQUID AND USE 2 SPRAYS IN EACH NOSTRIL DAILY AS NEEDED   hydrocortisone cream 0.5 % Apply topically 2 (two) times daily.   Multiple Vitamin (MULTIVITAMIN WITH MINERALS) TABS tablet Take 1 tablet by mouth daily.   [DISCONTINUED] glipiZIDE (GLUCOTROL) 10 MG tablet TAKE 1 TABLET(10 MG) BY MOUTH TWICE DAILY BEFORE A MEAL   [DISCONTINUED] metFORMIN (GLUCOPHAGE) 1000 MG tablet TAKE 1 TABLET(1000 MG) BY MOUTH TWICE DAILY WITH A MEAL   [DISCONTINUED] simvastatin (ZOCOR) 20 MG tablet TAKE 1 TABLET(20 MG) BY MOUTH DAILY AT 6 PM   [DISCONTINUED] doxycycline (VIBRA-TABS) 100 MG tablet Take 2 tablets (200 mg total) by mouth daily. (Patient not taking: Reported on 02/03/2023)   [DISCONTINUED] methocarbamol (ROBAXIN-750) 750 MG tablet Take 1 tablet (750 mg total) by mouth every 8 (eight) hours as needed for muscle spasms.   [DISCONTINUED] ondansetron (ZOFRAN-ODT) 4 MG disintegrating tablet Take 1 tablet (4 mg total) by mouth every 8 (eight) hours as needed for nausea or vomiting.   No facility-administered medications prior to visit.     Objective    BP 139/81 (BP Location: Left Arm, Patient Position: Sitting, Cuff Size: Normal)    Pulse 95   Ht 5\' 2"  (1.575 m)   Wt 200 lb (90.7 kg)   SpO2 100%   BMI 36.58 kg/m   Physical Exam Vitals and nursing note reviewed.  Constitutional:      General: She is not in acute distress.    Appearance: Normal appearance. She is obese. She is not ill-appearing, toxic-appearing or diaphoretic.  HENT:     Head: Normocephalic and atraumatic.  Cardiovascular:     Rate and Rhythm: Normal rate and regular rhythm.     Pulses: Normal pulses.     Heart sounds: Normal heart sounds. No murmur heard.    No friction rub. No gallop.  Pulmonary:     Effort: Pulmonary effort is normal. No respiratory distress.     Breath sounds: Normal breath sounds. No stridor. No wheezing, rhonchi or rales.  Chest:     Chest wall: No tenderness.  Musculoskeletal:        General: No swelling, tenderness, deformity or signs of injury. Normal range of motion.     Right lower leg: No edema.     Left lower leg: No edema.  Skin:    General: Skin is warm and dry.     Capillary Refill: Capillary refill takes less than 2 seconds.     Coloration: Skin is not jaundiced or pale.     Findings: No bruising, erythema, lesion or rash.  Neurological:     General: No focal deficit present.     Mental Status: She is alert and  oriented to person, place, and time. Mental status is at baseline.     Cranial Nerves: No cranial nerve deficit.     Sensory: No sensory deficit.     Motor: No weakness.     Coordination: Coordination normal.  Psychiatric:        Mood and Affect: Mood normal.        Behavior: Behavior normal.        Thought Content: Thought content normal.        Judgment: Judgment normal.     No results found for any visits on 02/03/23.  Assessment & Plan     Problem List Items Addressed This Visit       Endocrine   Hyperlipidemia associated with type 2 diabetes mellitus (HCC)    Chronic, at goal Repeat LP Refill zocor 20 mg to assist lifestyle recommend diet low in saturated fat and regular  exercise - 30 min at least 5 times per week       Relevant Medications   glipiZIDE (GLUCOTROL) 10 MG tablet   simvastatin (ZOCOR) 20 MG tablet   metFORMIN (GLUCOPHAGE) 1000 MG tablet   Type 2 diabetes mellitus with hyperglycemia, without long-term current use of insulin (HCC) - Primary    Chronic, stable per pt report; had labs done prior to surgery in April with A1c 5.8% Repeat urine and A1c Continue to recommend balanced, lower carb meals. Smaller meal size, adding snacks. Choosing water as drink of choice and increasing purposeful exercise. Refills meds Glipizide 10 mg BID and metformin 1000 mg BID       Relevant Medications   glipiZIDE (GLUCOTROL) 10 MG tablet   simvastatin (ZOCOR) 20 MG tablet   metFORMIN (GLUCOPHAGE) 1000 MG tablet   Other Relevant Orders   Urine microalbumin-creatinine with uACR   HgB A1c   Lipid panel   Comprehensive metabolic panel   CBC with Differential/Platelet     Other   Other chronic postprocedural pain    S/p back sx; still within limitations in ROM Request for refill of flexeril       Relevant Medications   cyclobenzaprine (FLEXERIL) 10 MG tablet   Screen for colon cancer    Due for repeat cologuard       Relevant Orders   Cologuard   Return in about 3 months (around 05/06/2023) for annual examination.     Leilani Merl, FNP, have reviewed all documentation for this visit. The documentation on 02/03/23 for the exam, diagnosis, procedures, and orders are all accurate and complete.  Jacky Kindle, FNP  [facility] [phone] (phone) [phone] (fax)  [facility]

## 2023-06-30 ENCOUNTER — Ambulatory Visit: Payer: Self-pay | Admitting: *Deleted

## 2023-06-30 NOTE — Telephone Encounter (Signed)
  Chief Complaint: wheezing  per husband that is on DPR and with patient now Symptoms: wheezing started last night. Coughing since Tuesday chest congestion non productive cough. No fever. Short of breath with exertion. Hx pneumonia  Frequency: last night  Pertinent Negatives: Patient denies chest pain not reported.  Disposition: [] ED /[x] Urgent Care (no appt availability in office) / [] Appointment(In office/virtual)/ []  [place] [facility]/ [] Home Care/ [] Refused Recommended Disposition /[] [place] [facility]/ []  Follow-up with PCP Additional Notes:   Recommended UC . No available OV until tomorrow with [facility]. Please advise, pt requesting a call back    Reason for Disposition  Wheezing is present  Answer Assessment - Initial Assessment Questions 1. ONSET: "When did the cough begin?"      Last night wheezing  2. SEVERITY: "How bad is the cough today?"      Getting worse starting to wheeze. 3. SPUTUM: "Describe the color of your sputum" (none, dry cough; clear, white, yellow, green)     None  4. HEMOPTYSIS: "Are you coughing up any blood?" If so ask: "How much?" (flecks, streaks, tablespoons, etc.)     na 5. DIFFICULTY BREATHING: "Are you having difficulty breathing?" If Yes, ask: "How bad is it?" (e.g., mild, moderate, severe)    - MILD: No SOB at rest, mild SOB with walking, speaks normally in sentences, can lie down, no retractions, pulse < 100.    - MODERATE: SOB at rest, SOB with minimal exertion and prefers to sit, cannot lie down flat, speaks in phrases, mild retractions, audible wheezing, pulse 100-120.    - SEVERE: Very SOB at rest, speaks in single words, struggling to breathe, sitting hunched forward, retractions, pulse > 120      Wheezing  6. FEVER: "Do you have a fever?" If Yes, ask: "What is your temperature, how was it measured, and when did it start?"     na 7. CARDIAC HISTORY: "Do you have any history of heart disease?" (e.g., heart attack, congestive heart failure)       Na  8. LUNG HISTORY: "Do you have any history of lung disease?"  (e.g., pulmonary embolus, asthma, emphysema)     Hx pneumonia  9. PE RISK FACTORS: "Do you have a history of blood clots?" (or: recent major surgery, recent prolonged travel, bedridden)     Na  10. OTHER SYMPTOMS: "Do you have any other symptoms?" (e.g., runny nose, wheezing, chest pain)       Wheezing, shortness of breath with coughing  chest congestion but non productive cough.  11. PREGNANCY: "Is there any chance you are pregnant?" "When was your last menstrual period?"       na 12. TRAVEL: "Have you traveled out of the country in the last month?" (e.g., travel history, exposures)       na  Protocols used: Cough - Acute Non-Productive-A-AH

## 2023-07-01 NOTE — Telephone Encounter (Signed)
Pt husband reports he took her to a walk in clinic yesterday. Patient has bronchitis and given appropriate rx. Aware to call office and schedule appt if any further assistance needed  FYI

## 2023-08-29 ENCOUNTER — Encounter: Payer: Self-pay | Admitting: Family Medicine

## 2023-08-29 DIAGNOSIS — Z1231 Encounter for screening mammogram for malignant neoplasm of breast: Secondary | ICD-10-CM

## 2023-11-30 LAB — HM DIABETES EYE EXAM

## 2023-12-02 ENCOUNTER — Telehealth: Payer: Self-pay | Admitting: Family Medicine

## 2023-12-02 DIAGNOSIS — E1165 Type 2 diabetes mellitus with hyperglycemia: Secondary | ICD-10-CM

## 2023-12-02 MED ORDER — METFORMIN HCL 1000 MG PO TABS: 1000.0000 mg | ORAL_TABLET | Freq: Two times a day (BID) | ORAL | 0 refills | Status: DC

## 2023-12-02 NOTE — Telephone Encounter (Signed)
 [facility] faxed refill request for the following medications:   metFORMIN (GLUCOPHAGE) 1000 MG tablet    Please advise.

## 2023-12-02 NOTE — Telephone Encounter (Signed)
 Courtesy sent. Patient needs OV with new PCP for further refills

## 2023-12-06 ENCOUNTER — Ambulatory Visit (INDEPENDENT_AMBULATORY_CARE_PROVIDER_SITE_OTHER): Admitting: Family Medicine

## 2023-12-06 ENCOUNTER — Encounter: Payer: Self-pay | Admitting: Family Medicine

## 2023-12-06 VITALS — BP 153/92 | HR 112 | Temp 98.5°F | Ht 62.0 in | Wt 188.9 lb

## 2023-12-06 DIAGNOSIS — E785 Hyperlipidemia, unspecified: Secondary | ICD-10-CM

## 2023-12-06 DIAGNOSIS — E559 Vitamin D deficiency, unspecified: Secondary | ICD-10-CM

## 2023-12-06 DIAGNOSIS — E1159 Type 2 diabetes mellitus with other circulatory complications: Secondary | ICD-10-CM

## 2023-12-06 DIAGNOSIS — E1165 Type 2 diabetes mellitus with hyperglycemia: Secondary | ICD-10-CM | POA: Diagnosis not present

## 2023-12-06 DIAGNOSIS — E1169 Type 2 diabetes mellitus with other specified complication: Secondary | ICD-10-CM

## 2023-12-06 DIAGNOSIS — Z0000 Encounter for general adult medical examination without abnormal findings: Secondary | ICD-10-CM | POA: Insufficient documentation

## 2023-12-06 DIAGNOSIS — I152 Hypertension secondary to endocrine disorders: Secondary | ICD-10-CM

## 2023-12-06 DIAGNOSIS — Z0001 Encounter for general adult medical examination with abnormal findings: Secondary | ICD-10-CM

## 2023-12-06 NOTE — Progress Notes (Signed)
 Complete physical exam   Patient: Lisa Rivers   DOB: 04/12/1971   53 y.o. Female  MRN: [id number] Visit Date: 12/06/2023  Today's healthcare provider: Mimi Alt, MD   Chief Complaint  Patient presents with   Annual Exam    Diet-  Limit Carbs and more Protein Exercise-  Walk 30 mins a day Feeling-  Okay til she had eye exam, stated that she has diabetic retinopathy very mild no medication Sleeping- Falls asleep great but wakes up around 1 or 2 and takes a few hours to fall back asleep, pretty much every night. Concerns- Newly diagnosis of retinopathy for diabetes, states that she has red spots like a rash under her eyes and it appears on her chest turns red and hurts, Also would like to know if she needs to take anyth   Care Management    Pneumococcal Vaccine- declined Shingles Vaccine- declined   Mammogram- yes   Subjective    Lisa Rivers is a 53 y.o. female who presents today for a complete physical exam.     She does not have additional problems to discuss today.   Discussed the use of AI scribe software for clinical note transcription with the patient, who gave verbal consent to proceed.  History of Present Illness [name] "Lisa Rivers" is a 53 year old female who presents for an annual physical exam.  She maintains a diet low in carbohydrates and high in protein, and engages in daily physical activity by walking for thirty minutes. She is concerned about her immune system, which she believes is compromised due to psoriatic arthritis and diabetes. She has experienced shingles twice, once at the age of 48 during a stressful period.  She was recently diagnosed with diabetic retinopathy during an eye exam. No specific symptoms or complications related to this condition were discussed during the visit.  She notes that her blood pressure tends to be high in medical settings but is lower at home, typically around 120/74 mmHg. She  attributes the elevated readings in clinical settings to anxiety and pain, recalling a similar experience during a previous back surgery. No recent home blood pressure measurements were taken today.     Past Medical History:  Diagnosis Date   ACL tear    Adopted    Allergy    Arthritis    Asthma    Diabetes mellitus without complication (HCC)    Headache    MIRGRAINES   Hyperlipidemia    Past Surgical History:  Procedure Laterality Date   APPENDECTOMY  12/2013   BACK SURGERY  02/2019   FOOT SURGERY  [id number],[id number]   BOTH FEET   SHOULDER ARTHROSCOPY WITH BANKART REPAIR Left 11/02/2016   Procedure: SHOULDER ARTHROSCOPY WITH SUPERIOR LABRAL REPAIR , distal clavicle excision, and subacrominal decompression;  Surgeon: Rande Bushy, MD;  Location: [facility] ORS;  Service: Orthopedics;  Laterality: Left;   Social History   Socioeconomic History   Marital status: Married    Spouse name: Lisa Rivers   Number of children: 0   Years of education: 12   Highest education level: 12th grade  Occupational History   Occupation: full time-city of Product/process development scientist: [employer]  Tobacco Use   Smoking status: Never   Smokeless tobacco: Never  Vaping Use   Vaping status: Never Used  Substance and Sexual Activity   Alcohol use: No   Drug use: No   Sexual activity: Yes    Birth control/protection: None  Other Topics Concern   Not on file  Social History Narrative   Not on file   Social Drivers of Health   Financial Resource Strain: Low Risk  (02/02/2023)   Overall Financial Resource Strain (CARDIA)    Difficulty of Paying Living Expenses: Not very hard  Food Insecurity: No Food Insecurity (02/02/2023)   Hunger Vital Sign    Worried About Running Out of Food in the Last Year: Never true    Ran Out of Food in the Last Year: Never true  Transportation Needs: No Transportation Needs (02/02/2023)   PRAPARE - Administrator, Civil Service (Medical): No    Lack of  Transportation (Non-Medical): No  Physical Activity: Insufficiently Active (02/02/2023)   Exercise Vital Sign    Days of Exercise per Week: 3 days    Minutes of Exercise per Session: 20 min  Stress: No Stress Concern Present (02/02/2023)   [employer] of Occupational Health - Occupational Stress Questionnaire    Feeling of Stress : Only a little  Social Connections: Moderately Isolated (02/02/2023)   Social Connection and Isolation Panel [NHANES]    Frequency of Communication with Friends and Family: More than three times a week    Frequency of Social Gatherings with Friends and Family: Once a week    Attends Religious Services: Never    Database administrator or Organizations: No    Attends Engineer, structural: Not on file    Marital Status: Married  Catering manager Violence: Not on file   Family Status  Relation Name Status   Mother Glynis Lass Deceased at age 7   Father Jock Muller Deceased at age 73   Brother Bambi Lever Alive   Sister  Alive   Brother  Deceased at age 75       shot himself   Brother  Deceased       shot himself   Brother  Deceased       complications from Hepatitis C   Brother  Alive  No partnership data on file   Family History  Adopted: Yes  Problem Relation Age of Onset   Coronary artery disease Mother    Alcohol abuse Mother    Heart disease Mother    Diabetes Father    Alcohol abuse Father    Diabetes Brother    Allergies  Allergen Reactions   Tape Other (See Comments)    ADHESIVE  CAUSED BLISTERS     Medications: Outpatient Medications Prior to Visit  Medication Sig   albuterol  (VENTOLIN  HFA) 108 (90 Base) MCG/ACT inhaler Inhale 2 puffs into the lungs every 6 (six) hours as needed for wheezing or shortness of breath.   b complex vitamins capsule Take 1 capsule by mouth daily.   Cholecalciferol (VITAMIN D  PO) Take 2 tablets by mouth daily. 1500 iu per tablet   cyclobenzaprine  (FLEXERIL ) 10 MG tablet Take 1 tablet (10 mg total) by mouth  3 (three) times daily as needed for muscle spasms.   fluticasone  (FLONASE ) 50 MCG/ACT nasal spray SHAKE LIQUID AND USE 2 SPRAYS IN EACH NOSTRIL DAILY AS NEEDED   glipiZIDE  (GLUCOTROL ) 10 MG tablet Take 1 tablet (10 mg total) by mouth 2 (two) times daily before a meal.   hydrocortisone  cream 0.5 % Apply topically 2 (two) times daily.   metFORMIN  (GLUCOPHAGE ) 1000 MG tablet Take 1 tablet (1,000 mg total) by mouth 2 (two) times daily with a meal.   Multiple Vitamin (MULTIVITAMIN WITH MINERALS) TABS tablet Take 1  tablet by mouth daily.   simvastatin  (ZOCOR ) 20 MG tablet Take 1 tablet (20 mg total) by mouth daily at 6 PM.   No facility-administered medications prior to visit.    Review of Systems     Objective    BP (!) 153/92 (BP Location: Right Arm, Patient Position: Sitting, Cuff Size: Normal)   Pulse (!) 112   Temp 98.5 F (36.9 C) (Oral)   Ht 5\' 2"  (1.575 m)   Wt 188 lb 14.4 oz (85.7 kg)   SpO2 100%   BMI 34.55 kg/m  BP Readings from Last 3 Encounters:  12/06/23 (!) 153/92  02/03/23 139/81  10/14/22 (!) 154/86   Wt Readings from Last 3 Encounters:  12/06/23 188 lb 14.4 oz (85.7 kg)  02/03/23 200 lb (90.7 kg)  10/14/22 186 lb 3.2 oz (84.5 kg)        Physical Exam Vitals reviewed.  Constitutional:      General: She is not in acute distress.    Appearance: Normal appearance. She is not ill-appearing, toxic-appearing or diaphoretic.  HENT:     Head: Normocephalic and atraumatic.     Right Ear: Tympanic membrane and external ear normal. There is no impacted cerumen.     Left Ear: Tympanic membrane and external ear normal. There is no impacted cerumen.     Nose: Nose normal.     Mouth/Throat:     Pharynx: Oropharynx is clear.  Eyes:     General: No scleral icterus.    Extraocular Movements: Extraocular movements intact.     Conjunctiva/sclera: Conjunctivae normal.     Pupils: Pupils are equal, round, and reactive to light.  Cardiovascular:     Rate and Rhythm:  Normal rate and regular rhythm.     Pulses: Normal pulses.     Heart sounds: Normal heart sounds. No murmur heard.    No friction rub. No gallop.  Pulmonary:     Effort: Pulmonary effort is normal. No respiratory distress.     Breath sounds: Normal breath sounds. No wheezing, rhonchi or rales.  Abdominal:     General: Bowel sounds are normal. There is no distension.     Palpations: Abdomen is soft. There is no mass.     Tenderness: There is no abdominal tenderness. There is no guarding.  Musculoskeletal:        General: No deformity.     Cervical back: Normal range of motion and neck supple.     Right lower leg: No edema.     Left lower leg: No edema.  Lymphadenopathy:     Cervical: No cervical adenopathy.  Skin:    General: Skin is warm.     Capillary Refill: Capillary refill takes less than 2 seconds.     Findings: No erythema or rash.  Neurological:     General: No focal deficit present.     Mental Status: She is alert and oriented to person, place, and time.     Cranial Nerves: Cranial nerves 2-12 are intact. No cranial nerve deficit or facial asymmetry.     Motor: Motor function is intact. No weakness.     Gait: Gait normal.  Psychiatric:        Mood and Affect: Mood normal.        Behavior: Behavior normal.        Last depression screening scores    12/06/2023    3:20 PM 02/03/2023    1:33 PM 10/14/2022    2:41 PM  PHQ  2/9 Scores  PHQ - 2 Score 0 0 0  PHQ- 9 Score 3  0    Last fall risk screening    12/06/2023    3:19 PM  Fall Risk   Falls in the past year? 1  Number falls in past yr: 0  Injury with Fall? 0  Comment Skinned knee    Last Audit-C alcohol use screening    02/02/2023    1:40 PM  Alcohol Use Disorder Test (AUDIT)  1. How often do you have a drink containing alcohol? 0  3. How often do you have six or more drinks on one occasion? 0   A score of 3 or more in women, and 4 or more in men indicates increased risk for alcohol abuse, EXCEPT if all  of the points are from question 1   Results for orders placed or performed in visit on 12/06/23  Comprehensive metabolic panel with GFR  Result Value Ref Range   Glucose 189 (H) 70 - 99 mg/dL   BUN 14 6 - 24 mg/dL   Creatinine, Ser 8.46 0.57 - 1.00 mg/dL   eGFR 83 >96 EX/BMW/4.13   BUN/Creatinine Ratio 17 9 - 23   Sodium 137 134 - 144 mmol/L   Potassium 4.3 3.5 - 5.2 mmol/L   Chloride 98 96 - 106 mmol/L   CO2 22 20 - 29 mmol/L   Calcium 9.8 8.7 - 10.2 mg/dL   Total Protein 7.4 6.0 - 8.5 g/dL   Albumin 4.9 3.8 - 4.9 g/dL   Globulin, Total 2.5 1.5 - 4.5 g/dL   Bilirubin Total 0.4 0.0 - 1.2 mg/dL   Alkaline Phosphatase 97 44 - 121 IU/L   AST 79 (H) 0 - 40 IU/L   ALT 79 (H) 0 - 32 IU/L  Lipid Panel With LDL/HDL Ratio  Result Value Ref Range   Cholesterol, Total 168 100 - 199 mg/dL   Triglycerides 244 (H) 0 - 149 mg/dL   HDL 46 >01 mg/dL   VLDL Cholesterol Cal 46 (H) 5 - 40 mg/dL   LDL Chol Calc (NIH) 76 0 - 99 mg/dL   LDL/HDL Ratio 1.7 0.0 - 3.2 ratio  Hemoglobin A1c  Result Value Ref Range   Hgb A1c MFr Bld 10.1 (H) 4.8 - 5.6 %   Est. average glucose Bld gHb Est-mCnc 243 mg/dL  VITAMIN D  25 Hydroxy (Vit-D Deficiency, Fractures)  Result Value Ref Range   Vit D, 25-Hydroxy 58.2 30.0 - 100.0 ng/mL  TSH+T4F+T3Free  Result Value Ref Range   TSH 1.570 0.450 - 4.500 uIU/mL   T3, Free 2.8 2.0 - 4.4 pg/mL   Free T4 1.00 0.82 - 1.77 ng/dL    Assessment & Plan    Routine Health Maintenance and Physical Exam  Immunization History  Administered Date(s) Administered   PFIZER Comirnaty(Gray Top)Covid-19 Tri-Sucrose Vaccine 03/14/2020   Tdap 08/21/2019, 09/18/2020    Health Maintenance  Topic Date Due   Pneumococcal Vaccine 80-71 Years old (1 of 2 - PCV) Never done   Zoster Vaccines- Shingrix (1 of 2) Never done   COVID-19 Vaccine (2 - Pfizer risk series) 04/04/2020   Diabetic kidney evaluation - Urine ACR  02/03/2024   INFLUENZA VACCINE  02/03/2024   FOOT EXAM  02/03/2024    HEMOGLOBIN A1C  06/06/2024   MAMMOGRAM  07/16/2024   Cervical Cancer Screening (HPV/Pap Cotest)  08/29/2024   OPHTHALMOLOGY EXAM  11/29/2024   Diabetic kidney evaluation - eGFR measurement  12/05/2024   Fecal  DNA (Cologuard)  02/22/2026   DTaP/Tdap/Td (3 - Td or Tdap) 09/19/2030   Hepatitis C Screening  Completed   HIV Screening  Completed   HPV VACCINES  Aged Out   Meningococcal B Vaccine  Aged Out    Problem List Items Addressed This Visit       Cardiovascular and Mediastinum   Hypertension associated with diabetes (HCC)   Relevant Orders   Comprehensive metabolic panel with GFR (Completed)   TSH+T4F+T3Free (Completed)     Endocrine   Type 2 diabetes mellitus with hyperglycemia, without long-term current use of insulin  (HCC)   Relevant Orders   Hemoglobin A1c (Completed)   Hyperlipidemia associated with type 2 diabetes mellitus (HCC)   Relevant Orders   Lipid Panel With LDL/HDL Ratio (Completed)     Other   Annual physical exam - Primary   Routine annual physical examination. She is mindful about diet, limits carbohydrates, increases protein, and walks 30 minutes a day. - Continue current exercise regimen of walking 30 minutes a day - Collect CMP, lipid panel, hemoglobin A1c, and vitamin D  level  Due for pneumococcal and Shingrix vaccines. She expresses concern about vaccines due to past experiences with flu shots and a compromised immune system. Pneumococcal vaccine recommended due to increased risk of invasive bacterial infections, especially with diabetes. Shingrix vaccine may cause flu-like symptoms, hence advised against receiving both vaccines simultaneously. - Provide reading material on pneumococcal and Shingrix vaccines - Recommend pneumococcal vaccine, especially given history of diabetes - Discuss potential flu-like symptoms post-vaccination, particularly with Shingrix vaccine        Other Visit Diagnoses       Avitaminosis D       Relevant Orders    VITAMIN D  25 Hydroxy (Vit-D Deficiency, Fractures) (Completed)       Assessment & Plan  Hypertension Elevated blood pressure in the clinic, possibly due to anxiety. Home measurements are typically lower. No current antihypertensive medication. She is open to medication if necessary. - Recheck blood pressure manually in the clinic - If blood pressure remains elevated (over 140/90 mmHg), schedule follow-up in one month - If blood pressure is normal, schedule follow-up in four months - Instruct to track blood pressure at home for one month        Return in about 1 month (around 01/05/2024) for HTN.       Mimi Alt, MD  [facility] [phone] (phone) [phone] (fax)  [facility]

## 2023-12-07 ENCOUNTER — Ambulatory Visit: Payer: Self-pay | Admitting: Family Medicine

## 2023-12-07 LAB — HEMOGLOBIN A1C
Est. average glucose Bld gHb Est-mCnc: 243 mg/dL
Hgb A1c MFr Bld: 10.1 % — ABNORMAL HIGH (ref 4.8–5.6)

## 2023-12-07 LAB — COMPREHENSIVE METABOLIC PANEL WITH GFR
ALT: 79 IU/L — ABNORMAL HIGH (ref 0–32)
AST: 79 IU/L — ABNORMAL HIGH (ref 0–40)
Albumin: 4.9 g/dL (ref 3.8–4.9)
Alkaline Phosphatase: 97 IU/L (ref 44–121)
BUN/Creatinine Ratio: 17 (ref 9–23)
BUN: 14 mg/dL (ref 6–24)
Bilirubin Total: 0.4 mg/dL (ref 0.0–1.2)
CO2: 22 mmol/L (ref 20–29)
Calcium: 9.8 mg/dL (ref 8.7–10.2)
Chloride: 98 mmol/L (ref 96–106)
Creatinine, Ser: 0.84 mg/dL (ref 0.57–1.00)
Globulin, Total: 2.5 g/dL (ref 1.5–4.5)
Glucose: 189 mg/dL — ABNORMAL HIGH (ref 70–99)
Potassium: 4.3 mmol/L (ref 3.5–5.2)
Sodium: 137 mmol/L (ref 134–144)
Total Protein: 7.4 g/dL (ref 6.0–8.5)
eGFR: 83 mL/min/{1.73_m2} (ref >59)

## 2023-12-07 LAB — LIPID PANEL WITH LDL/HDL RATIO
Cholesterol, Total: 168 mg/dL (ref 100–199)
HDL: 46 mg/dL (ref >39)
LDL Chol Calc (NIH): 76 mg/dL (ref 0–99)
LDL/HDL Ratio: 1.7 ratio (ref 0.0–3.2)
Triglycerides: 286 mg/dL — ABNORMAL HIGH (ref 0–149)
VLDL Cholesterol Cal: 46 mg/dL — ABNORMAL HIGH (ref 5–40)

## 2023-12-07 LAB — TSH+T4F+T3FREE
Free T4: 1 ng/dL (ref 0.82–1.77)
T3, Free: 2.8 pg/mL (ref 2.0–4.4)
TSH: 1.57 u[IU]/mL (ref 0.450–4.500)

## 2023-12-07 LAB — VITAMIN D 25 HYDROXY (VIT D DEFICIENCY, FRACTURES): Vit D, 25-Hydroxy: 58.2 ng/mL (ref 30.0–100.0)

## 2023-12-07 NOTE — Assessment & Plan Note (Addendum)
 Routine annual physical examination. She is mindful about diet, limits carbohydrates, increases protein, and walks 30 minutes a day. - Continue current exercise regimen of walking 30 minutes a day - Collect CMP, lipid panel, hemoglobin A1c, and vitamin D  level  Due for pneumococcal and Shingrix vaccines. She expresses concern about vaccines due to past experiences with flu shots and a compromised immune system. Pneumococcal vaccine recommended due to increased risk of invasive bacterial infections, especially with diabetes. Shingrix vaccine may cause flu-like symptoms, hence advised against receiving both vaccines simultaneously. - Provide reading material on pneumococcal and Shingrix vaccines - Recommend pneumococcal vaccine, especially given history of diabetes - Discuss potential flu-like symptoms post-vaccination, particularly with Shingrix vaccine

## 2023-12-19 ENCOUNTER — Other Ambulatory Visit: Payer: Self-pay

## 2023-12-19 ENCOUNTER — Telehealth: Payer: Self-pay | Admitting: Family Medicine

## 2023-12-19 DIAGNOSIS — E1169 Type 2 diabetes mellitus with other specified complication: Secondary | ICD-10-CM

## 2023-12-19 DIAGNOSIS — E1165 Type 2 diabetes mellitus with hyperglycemia: Secondary | ICD-10-CM

## 2023-12-19 MED ORDER — GLIPIZIDE 10 MG PO TABS: 10.0000 mg | ORAL_TABLET | Freq: Two times a day (BID) | ORAL | 3 refills | Status: DC

## 2023-12-19 MED ORDER — SIMVASTATIN 20 MG PO TABS: 20.0000 mg | ORAL_TABLET | Freq: Every day | ORAL | 3 refills | Status: DC

## 2023-12-19 NOTE — Telephone Encounter (Signed)
 [facility] faxed refill request for the following medications:   simvastatin  (ZOCOR ) 20 MG tablet    glipiZIDE  (GLUCOTROL ) 10 MG tablet   Please advise

## 2024-01-04 ENCOUNTER — Telehealth: Payer: Self-pay | Admitting: Family Medicine

## 2024-01-04 DIAGNOSIS — E1169 Type 2 diabetes mellitus with other specified complication: Secondary | ICD-10-CM

## 2024-01-04 DIAGNOSIS — E1165 Type 2 diabetes mellitus with hyperglycemia: Secondary | ICD-10-CM

## 2024-01-04 MED ORDER — SIMVASTATIN 20 MG PO TABS: 20.0000 mg | ORAL_TABLET | Freq: Every day | ORAL | 3 refills | Status: AC

## 2024-01-04 MED ORDER — GLIPIZIDE 10 MG PO TABS: 10.0000 mg | ORAL_TABLET | Freq: Two times a day (BID) | ORAL | 3 refills | Status: DC

## 2024-01-04 NOTE — Telephone Encounter (Signed)
 [facility]

## 2024-01-04 NOTE — Telephone Encounter (Signed)
 [facility] is requesting refill simvastatin  (ZOCOR ) 20 MG tablet  & glipiZIDE  (GLUCOTROL ) 10 MG tablet  Please advise

## 2024-01-18 ENCOUNTER — Ambulatory Visit: Admitting: Family Medicine

## 2024-01-18 ENCOUNTER — Encounter: Payer: Self-pay | Admitting: Family Medicine

## 2024-01-18 VITALS — BP 138/79 | HR 105 | Ht 62.0 in | Wt 187.0 lb

## 2024-01-18 DIAGNOSIS — E1169 Type 2 diabetes mellitus with other specified complication: Secondary | ICD-10-CM

## 2024-01-18 DIAGNOSIS — E1165 Type 2 diabetes mellitus with hyperglycemia: Secondary | ICD-10-CM | POA: Diagnosis not present

## 2024-01-18 DIAGNOSIS — I152 Hypertension secondary to endocrine disorders: Secondary | ICD-10-CM

## 2024-01-18 DIAGNOSIS — N1831 Chronic kidney disease, stage 3a: Secondary | ICD-10-CM | POA: Diagnosis not present

## 2024-01-18 DIAGNOSIS — Z7984 Long term (current) use of oral hypoglycemic drugs: Secondary | ICD-10-CM

## 2024-01-18 DIAGNOSIS — E785 Hyperlipidemia, unspecified: Secondary | ICD-10-CM

## 2024-01-18 DIAGNOSIS — E1159 Type 2 diabetes mellitus with other circulatory complications: Secondary | ICD-10-CM

## 2024-01-18 MED ORDER — OZEMPIC (0.25 OR 0.5 MG/DOSE) 2 MG/3ML ~~LOC~~ SOPN: PEN_INJECTOR | SUBCUTANEOUS | 2 refills | Status: DC

## 2024-01-18 NOTE — Progress Notes (Signed)
 Established patient visit   Patient: Lisa Rivers   DOB: September 08, 1970   53 y.o. Female  MRN: [id number] Visit Date: 01/18/2024  Today's healthcare provider: Rockie Agent, MD   Chief Complaint  Patient presents with   Hypertension   Subjective       Discussed the use of AI scribe software for clinical note transcription with the patient, who gave verbal consent to proceed.  History of Present Illness Lisa Rivers is a 53 year old female with chronic hypertension and diabetes who presents for follow-up of her chronic conditions.  She reports a recent A1c of 10%. She is currently taking glipizide  10 mg twice a day and metformin  1000 mg twice a day. No significant changes in her diet, but she notes a decrease in physical activity due to back pain and knee restrictions, impacting her ability to garden and perform other physical activities.  She mentions that her eye health has been affected, with recent changes noted in her retinopathy; she reports that her eye doctor did not recommend any intervention at this time.  Her family history includes her brother having diabetes and experiencing a diabetic coma, her father having prostate cancer, and her mother passing away from a 'heart seizure' when she was [age]. 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  Her social history includes a recent emotional event where her dog passed away, which she notes affected her blood pressure readings at home. She monitors her blood pressure regularly, and most readings are within normal limits, except for the day of her dog's passing.     Past Medical History:  Diagnosis Date   ACL tear    Adopted    Allergy    Arthritis    Asthma    Diabetes mellitus without complication (HCC)    Headache    MIRGRAINES   Hyperlipidemia     Medications: Outpatient Medications Prior to Visit  Medication Sig   albuterol   (VENTOLIN  HFA) 108 (90 Base) MCG/ACT inhaler Inhale 2 puffs into the lungs every 6 (six) hours as needed for wheezing or shortness of breath.   b complex vitamins capsule Take 1 capsule by mouth daily.   Cholecalciferol (VITAMIN D  PO) Take 2 tablets by mouth daily. 1500 iu per tablet   cyclobenzaprine  (FLEXERIL ) 10 MG tablet Take 1 tablet (10 mg total) by mouth 3 (three) times daily as needed for muscle spasms.   fluticasone  (FLONASE ) 50 MCG/ACT nasal spray SHAKE LIQUID AND USE 2 SPRAYS IN EACH NOSTRIL DAILY AS NEEDED   glipiZIDE  (GLUCOTROL ) 10 MG tablet Take 1 tablet (10 mg total) by mouth 2 (two) times daily before a meal.   hydrocortisone  cream 0.5 % Apply topically 2 (two) times daily.   metFORMIN  (GLUCOPHAGE ) 1000 MG tablet Take 1 tablet (1,000 mg total) by mouth 2 (two) times daily with a meal.   Multiple Vitamin (MULTIVITAMIN WITH MINERALS) TABS tablet Take 1 tablet by mouth daily.   simvastatin  (ZOCOR ) 20 MG tablet Take 1 tablet (20 mg total) by mouth daily at 6 PM.   No facility-administered medications prior to visit.    Review of Systems  Last metabolic panel Lab Results  Component Value Date   GLUCOSE 189 (H) 12/06/2023   NA 137 12/06/2023   K 4.3 12/06/2023   CL 98 12/06/2023   CO2 22 12/06/2023   BUN 14 12/06/2023   CREATININE 0.84 12/06/2023   EGFR 83  12/06/2023   CALCIUM 9.8 12/06/2023   PROT 7.4 12/06/2023   ALBUMIN 4.9 12/06/2023   LABGLOB 2.5 12/06/2023   AGRATIO 1.7 04/21/2022   BILITOT 0.4 12/06/2023   ALKPHOS 97 12/06/2023   AST 79 (H) 12/06/2023   ALT 79 (H) 12/06/2023   ANIONGAP 11 06/24/2022   Last lipids Lab Results  Component Value Date   CHOL 168 12/06/2023   HDL 46 12/06/2023   LDLCALC 76 12/06/2023   TRIG 286 (H) 12/06/2023   CHOLHDL 3.1 02/03/2023   Last hemoglobin A1c Lab Results  Component Value Date   HGBA1C 10.1 (H) 12/06/2023        Objective    BP 138/79   Pulse (!) 105   Ht 5' 2 (1.575 m)   Wt 187 lb (84.8 kg)   SpO2  100%   BMI 34.20 kg/m  BP Readings from Last 3 Encounters:  01/18/24 138/79  12/06/23 (!) 153/92  02/03/23 139/81   Wt Readings from Last 3 Encounters:  01/18/24 187 lb (84.8 kg)  12/06/23 188 lb 14.4 oz (85.7 kg)  02/03/23 200 lb (90.7 kg)        Physical Exam Vitals reviewed.  Constitutional:      General: She is not in acute distress.    Appearance: Normal appearance. She is not ill-appearing.  Cardiovascular:     Rate and Rhythm: Normal rate and regular rhythm.  Pulmonary:     Effort: Pulmonary effort is normal. No respiratory distress.     Breath sounds: No wheezing, rhonchi or rales.  Neurological:     Mental Status: She is alert and oriented to person, place, and time.  Psychiatric:        Mood and Affect: Mood normal.        Behavior: Behavior normal.     Physical Exam      No results found for any visits on 01/18/24.  Assessment & Plan     Problem List Items Addressed This Visit       Cardiovascular and Mediastinum   Hypertension associated with diabetes (HCC) - Primary   chronic Hypertension is well controlled on the current regimen. Home blood pressure readings are generally within target range, with occasional elevations due to stress or emotional events. - Continue current antihypertensive regimen - Review home blood pressure log and incorporate into medical record      Relevant Medications   Semaglutide ,0.25 or 0.5MG /DOS, (OZEMPIC , 0.25 OR 0.5 MG/DOSE,) 2 MG/3ML SOPN     Endocrine   Type 2 diabetes mellitus with hyperglycemia, without long-term current use of insulin  (HCC)   Chronic Type 2 Diabetes Mellitus is poorly controlled with a recent A1c of 10%. The current regimen includes glipizide  10 mg twice daily and metformin  1000 mg twice daily. Decreased physical activity due to back pain and knee issues may contribute to poor glycemic control. Discussed the potential benefits and risks of starting Ozempic , including its effects on weight  loss, kidney and heart protection, and potential side effects such as nausea, diarrhea, and effects on eye vessels. She expressed concerns about long-term effects and muscle loss but agreed to try Ozempic  over insulin  therapy. Ozempic  is expected to be part of the chronic regimen, similar to glipizide  and metformin , unless adverse effects occur. - Prescribe Ozempic  0.25 mg once weekly for 1 month, then increase to 0.5 mg once weekly - Provide a continuous glucose monitor [facility]) for 1 month to gather information on glucose patterns - Schedule follow-up in October  to reassess A1c and treatment efficacy      Relevant Medications   Semaglutide ,0.25 or 0.5MG /DOS, (OZEMPIC , 0.25 OR 0.5 MG/DOSE,) 2 MG/3ML SOPN   Hyperlipidemia associated with type 2 diabetes mellitus (HCC)   Chronic  Continue simvastatin  20mg  daily       Relevant Medications   Semaglutide ,0.25 or 0.5MG /DOS, (OZEMPIC , 0.25 OR 0.5 MG/DOSE,) 2 MG/3ML SOPN     Genitourinary   Stage 3a chronic kidney disease (HCC)   Relevant Medications   Semaglutide ,0.25 or 0.5MG /DOS, (OZEMPIC , 0.25 OR 0.5 MG/DOSE,) 2 MG/3ML SOPN     Assessment & Plan   Diabetic Retinopathy Recent changes in eye health with mention of retinopathy. Ozempic  may affect eye vessels, but no specific action is advised at this time. Regular monitoring is essential. - Ensure regular eye exams as scheduled    General Health Maintenance Discussed the importance of maintaining physical activity to aid in glucose control. She is attempting to increase walking despite physical limitations. - Encourage regular physical activity as tolerated     Return in about 3 months (around 04/19/2024) for DM.         Rockie Agent, MD  [facility] [phone] (phone) [phone] (fax)  [facility]

## 2024-01-18 NOTE — Assessment & Plan Note (Signed)
 chronic Hypertension is well controlled on the current regimen. Home blood pressure readings are generally within target range, with occasional elevations due to stress or emotional events. - Continue current antihypertensive regimen - Review home blood pressure log and incorporate into medical record

## 2024-01-18 NOTE — Assessment & Plan Note (Signed)
 Chronic Type 2 Diabetes Mellitus is poorly controlled with a recent A1c of 10%. The current regimen includes glipizide  10 mg twice daily and metformin  1000 mg twice daily. Decreased physical activity due to back pain and knee issues may contribute to poor glycemic control. Discussed the potential benefits and risks of starting Ozempic , including its effects on weight loss, kidney and heart protection, and potential side effects such as nausea, diarrhea, and effects on eye vessels. She expressed concerns about long-term effects and muscle loss but agreed to try Ozempic  over insulin  therapy. Ozempic  is expected to be part of the chronic regimen, similar to glipizide  and metformin , unless adverse effects occur. - Prescribe Ozempic  0.25 mg once weekly for 1 month, then increase to 0.5 mg once weekly - Provide a continuous glucose monitor [facility]) for 1 month to gather information on glucose patterns - Schedule follow-up in October to reassess A1c and treatment efficacy

## 2024-01-18 NOTE — Assessment & Plan Note (Signed)
Chronic Continue simvastatin 20 mg daily

## 2024-02-16 ENCOUNTER — Encounter: Payer: Self-pay | Admitting: Family Medicine

## 2024-02-16 ENCOUNTER — Ambulatory Visit: Admitting: Family Medicine

## 2024-02-16 ENCOUNTER — Ambulatory Visit: Payer: Self-pay

## 2024-02-16 VITALS — BP 129/63 | HR 99 | Resp 16 | Ht 62.0 in | Wt 183.0 lb

## 2024-02-16 DIAGNOSIS — L210 Seborrhea capitis: Secondary | ICD-10-CM

## 2024-02-16 MED ORDER — CLOBETASOL PROPIONATE 0.05 % EX CREA: 1.0000 | TOPICAL_CREAM | Freq: Two times a day (BID) | CUTANEOUS | 0 refills | Status: AC

## 2024-02-16 MED ORDER — ACYCLOVIR 400 MG PO TABS: 400.0000 mg | ORAL_TABLET | Freq: Three times a day (TID) | ORAL | 0 refills | Status: AC

## 2024-02-16 NOTE — Telephone Encounter (Signed)
 FYI Only or Action Required?: FYI only for provider.  Patient was last seen in primary care on 01/18/2024 by Sharma Coyer, MD.  Called Nurse Triage reporting [place].  Symptoms began several weeks ago.  Interventions attempted: Nothing.  Symptoms are: unchanged.  Triage Disposition: See PCP Within 2 Weeks  Patient/caregiver understands and will follow disposition?: Yes                  Copied from CRM [id number]. Topic: Clinical - Red Word Triage >> Feb 16, 2024  8:25 AM Willma R wrote: [facility] that prompted transfer to Nurse Triage: Patient has a new spot in the middle of her back smaller than a dime. Spot was puffy, scratched it and now is causing pain. Reason for Disposition  [1] Skin growth or mole AND [2] bleeds or itches or is painful  Answer Assessment - Initial Assessment Questions 1. APPEARANCE of LESION: What does it look like?      On back, flesh colored, reddish brown 2. SIZE: How big is it? (e.g., inches, cm; or compare to size of pinhead, tip of pen, eraser, coin, pea, grape, ping pong ball)      Smaller than a dime 3. COLOR: What color is it? Is there more than one color?     Reddish brown, was puffed up and now it's not 4. SHAPE: What shape is it? (e.g., round, irregular)     Oval, outer edge looks darker and middle is the red part 5. RAISED: Does it stick up above the skin or is it flat? (e.g., raised or elevated)     Not now but was 6. TENDER: Does it hurt when you touch it?  (Scale 1-10; or mild, moderate, severe)     Tender, mild 7. LOCATION: Where is it located?      On back 8. ONSET: When did it first appear?      Two weeks ago 9. NUMBER: Is there just one? or Are there others?     1 10. CAUSE: What do you think it is?       unknown 11. OTHER SYMPTOMS: Do you have any other symptoms? (e.g., fever)       no 12. PREGNANCY: Is there any chance you are pregnant? When was your last menstrual  period?       na  Protocols used: Skin Lesion - Moles or Growths-A-AH

## 2024-02-16 NOTE — Progress Notes (Signed)
 ACUTE VISIT   Patient: Lisa Rivers   DOB: December 20, 1970   53 y.o. Female  MRN: [id number]   PCP: Sharma Coyer, MD  Chief Complaint  Patient presents with   Acute Visit    Pt has spot her back x2 wks with itching   Subjective    HPI HPI     Acute Visit    Additional comments: Pt has spot her back x2 wks with itching      Last edited by Marylen Odella CROME, CMA on 02/16/2024 10:51 AM.       Discussed the use of AI scribe software for clinical note transcription with the patient, who gave verbal consent to proceed.  History of Present Illness Lisa Rivers is a 53 year old female with psoriatic arthritis who presents with an itchy and red skin lesion.  She has had a skin lesion for two weeks, initially thought to be a mosquito bite. The lesion is itchy and described as puffy by her husband. Persistent itching prompted her to seek medical attention.  She has a history of psoriatic arthritis and other skin spots that do not typically itch. She applied hydrocortisone  cream, 1% strength, to the lesion for the first time last night without noticeable improvement. She takes Claritin 10 mg in the morning and Benadryl at night for general itchy skin.  She spends a lot of time outdoors in her yard and garden and has a pool room, which may contribute to her skin concerns. She uses sunscreen now, although she did not in the past.  She has not seen a dermatologist in years, despite having been given steroid creams in the past for skin issues. A previous dermatologist mentioned her skin condition might be hereditary, possibly related to her [place] heritage.     Medications: Outpatient Medications Prior to Visit  Medication Sig   albuterol  (VENTOLIN  HFA) 108 (90 Base) MCG/ACT inhaler Inhale 2 puffs into the lungs every 6 (six) hours as needed for wheezing or shortness of breath.   b complex vitamins capsule Take 1 capsule by mouth daily.    Cholecalciferol (VITAMIN D  PO) Take 2 tablets by mouth daily. 1500 iu per tablet   cyclobenzaprine  (FLEXERIL ) 10 MG tablet Take 1 tablet (10 mg total) by mouth 3 (three) times daily as needed for muscle spasms.   fluticasone  (FLONASE ) 50 MCG/ACT nasal spray SHAKE LIQUID AND USE 2 SPRAYS IN EACH NOSTRIL DAILY AS NEEDED   glipiZIDE  (GLUCOTROL ) 10 MG tablet Take 1 tablet (10 mg total) by mouth 2 (two) times daily before a meal.   hydrocortisone  cream 0.5 % Apply topically 2 (two) times daily.   metFORMIN  (GLUCOPHAGE ) 1000 MG tablet Take 1 tablet (1,000 mg total) by mouth 2 (two) times daily with a meal.   Multiple Vitamin (MULTIVITAMIN WITH MINERALS) TABS tablet Take 1 tablet by mouth daily.   Semaglutide ,0.25 or 0.5MG /DOS, (OZEMPIC , 0.25 OR 0.5 MG/DOSE,) 2 MG/3ML SOPN Inject 0.25 mg into the skin once a week for 28 days, THEN 0.5 mg once a week for 28 days.   simvastatin  (ZOCOR ) 20 MG tablet Take 1 tablet (20 mg total) by mouth daily at 6 PM.   No facility-administered medications prior to visit.        Objective    BP 129/63 (BP Location: Right Arm, Patient Position: Sitting, Cuff Size: Normal)   Pulse 99   Resp 16   Ht 5' 2 (1.575 m)   Wt 183 lb (  83 kg)   SpO2 100%   BMI 33.47 kg/m    Physical Exam   Physical Exam SKIN: Skin lesion on back, itching and red. No ulceration.   No results found for any visits on 02/16/24.  Assessment & Plan     Assessment and Plan Assessment & Plan Pruritic skin lesion, likely pityriasis rosea Acute  Pruritic skin lesion present for two weeks on the back, described as itchy and initially puffy. Differential diagnosis includes pityriasis rosea, fungal infection, actinic keratosis, and squamous cell carcinoma. Lesion does not appear ulcerated or consistent with shingles. Previous use of hydrocortisone  1% cream was ineffective. - Prescribe clobetasol  0.05% cream to be applied twice daily with a Q-tip for up to two weeks. Monitor for skin thinning  and discoloration. - Continue current antihistamine regimen with Claritin 10 mg in the morning and Benadryl 50 mg at night. - Consider adding Zyrtec 10 mg during the day and Pepcid  20 mg at bedtime if needed for additional relief. - Prescribe Aciclovir 400 mg three times a day for five days as a contingency if symptoms do not improve with topical treatment. - Schedule follow-up appointment in 3-4 weeks to reassess the lesion. Cancel if symptoms resolve.      Return in about 3 weeks (around 03/08/2024).        Rockie Agent, MD  [facility] [phone] (phone) [phone] (fax)  [facility]

## 2024-02-16 NOTE — Telephone Encounter (Signed)
 Patient call note and symptoms reviewed. Agree with scheduled appt. Will evaluate during OV

## 2024-02-22 ENCOUNTER — Telehealth: Payer: Self-pay | Admitting: Family Medicine

## 2024-02-22 NOTE — Telephone Encounter (Signed)
 [facility] faxed refill request for the following medications:   cyclobenzaprine  (FLEXERIL ) 10 MG tablet    Please advise.

## 2024-02-22 NOTE — Telephone Encounter (Signed)
 LOV 02/16/24 NOV 04/20/24 LRF 02/03/23 LABS  12/06/23

## 2024-02-24 NOTE — Telephone Encounter (Signed)
 [facility] - 2nd request for flexeril  10 mg. #270

## 2024-02-27 ENCOUNTER — Other Ambulatory Visit: Payer: Self-pay

## 2024-02-27 DIAGNOSIS — G8928 Other chronic postprocedural pain: Secondary | ICD-10-CM

## 2024-02-27 NOTE — Telephone Encounter (Signed)
 Netta Niels HERO CB   02/24/24  2:15 PM Note [facility] - 2nd request for flexeril  10 mg. #270      Thelbert Eulalio HERO, CMA    02/22/24  4:17 PM Note LOV 02/16/24 NOV 04/20/24 LRF 02/03/23 LABS  12/06/23      02/22/24  2:11 PM Bridgette Delon CROME routed this conversation to [facility] Nurse  Bridgette Delon CROME JD   02/22/24  2:11 PM Note [facility] faxed refill request for the following medications:    cyclobenzaprine  (FLEXERIL ) 10 MG tablet      Please advise.         02/22/24  2:08 PM [facility] - [place], [place] - [phone] Education administrator) contacted [place], Jennifer L Engineer, production)

## 2024-02-27 NOTE — Telephone Encounter (Signed)
 RFR sent to PCP

## 2024-02-28 MED ORDER — CYCLOBENZAPRINE HCL 10 MG PO TABS: 10.0000 mg | ORAL_TABLET | Freq: Three times a day (TID) | ORAL | 3 refills | Status: AC | PRN

## 2024-03-21 ENCOUNTER — Other Ambulatory Visit: Payer: Self-pay | Admitting: Family Medicine

## 2024-03-21 DIAGNOSIS — N1831 Chronic kidney disease, stage 3a: Secondary | ICD-10-CM

## 2024-03-21 DIAGNOSIS — E1165 Type 2 diabetes mellitus with hyperglycemia: Secondary | ICD-10-CM

## 2024-04-03 ENCOUNTER — Other Ambulatory Visit: Payer: Self-pay | Admitting: Family Medicine

## 2024-04-03 DIAGNOSIS — E1165 Type 2 diabetes mellitus with hyperglycemia: Secondary | ICD-10-CM

## 2024-04-10 ENCOUNTER — Other Ambulatory Visit: Payer: Self-pay | Admitting: Family Medicine

## 2024-04-10 DIAGNOSIS — Z1231 Encounter for screening mammogram for malignant neoplasm of breast: Secondary | ICD-10-CM

## 2024-04-19 ENCOUNTER — Telehealth: Payer: Self-pay

## 2024-04-19 DIAGNOSIS — E1165 Type 2 diabetes mellitus with hyperglycemia: Secondary | ICD-10-CM

## 2024-04-19 DIAGNOSIS — I152 Hypertension secondary to endocrine disorders: Secondary | ICD-10-CM

## 2024-04-19 NOTE — Telephone Encounter (Signed)
 Copied from CRM [id number]. Topic: Clinical - Request for Lab/Test Order >> Apr 19, 2024 11:02 AM Rosaria BRAVO wrote: Reason for CRM: Pt needs labs for ozempic  was told to come in 04/20/2024  Best contact: [phone]

## 2024-04-20 ENCOUNTER — Ambulatory Visit: Admitting: Family Medicine

## 2024-04-24 NOTE — Addendum Note (Signed)
 Addended by: SIMMONS-ROBINSON, Chea Malan L on: 04/24/2024 01:04 PM   Modules accepted: Orders

## 2024-04-24 NOTE — Telephone Encounter (Signed)
A1c and BMP ordered

## 2024-04-24 NOTE — Telephone Encounter (Signed)
 Patient advised.

## 2024-05-08 ENCOUNTER — Ambulatory Visit
Admission: RE | Admit: 2024-05-08 | Discharge: 2024-05-08 | Disposition: A | Discharge status: Home / Self Care | Source: Ambulatory Visit | Attending: Family Medicine | Admitting: Family Medicine

## 2024-05-08 DIAGNOSIS — Z1231 Encounter for screening mammogram for malignant neoplasm of breast: Secondary | ICD-10-CM | POA: Diagnosis present

## 2024-05-11 ENCOUNTER — Ambulatory Visit: Payer: Self-pay | Admitting: Family Medicine

## 2024-05-16 ENCOUNTER — Ambulatory Visit: Payer: Self-pay | Admitting: Family Medicine

## 2024-05-16 LAB — HEMOGLOBIN A1C
Est. average glucose Bld gHb Est-mCnc: 123 mg/dL
Hgb A1c MFr Bld: 5.9 % — ABNORMAL HIGH (ref 4.8–5.6)

## 2024-05-16 LAB — BMP8+EGFR
BUN/Creatinine Ratio: 12 (ref 9–23)
BUN: 11 mg/dL (ref 6–24)
CO2: 25 mmol/L (ref 20–29)
Calcium: 9.9 mg/dL (ref 8.7–10.2)
Chloride: 102 mmol/L (ref 96–106)
Creatinine, Ser: 0.93 mg/dL (ref 0.57–1.00)
Glucose: 105 mg/dL — ABNORMAL HIGH (ref 70–99)
Potassium: 5 mmol/L (ref 3.5–5.2)
Sodium: 141 mmol/L (ref 134–144)
eGFR: 73 mL/min/1.73 (ref >59)

## 2024-07-12 ENCOUNTER — Encounter: Payer: Self-pay | Admitting: Family Medicine

## 2024-07-12 ENCOUNTER — Ambulatory Visit: Admitting: Family Medicine

## 2024-07-12 VITALS — BP 138/84 | HR 104 | Temp 98.2°F | Ht 62.0 in | Wt 172.4 lb

## 2024-07-12 DIAGNOSIS — Z7985 Long-term (current) use of injectable non-insulin antidiabetic drugs: Secondary | ICD-10-CM | POA: Diagnosis not present

## 2024-07-12 DIAGNOSIS — E1169 Type 2 diabetes mellitus with other specified complication: Secondary | ICD-10-CM

## 2024-07-12 DIAGNOSIS — I152 Hypertension secondary to endocrine disorders: Secondary | ICD-10-CM

## 2024-07-12 DIAGNOSIS — N1831 Chronic kidney disease, stage 3a: Secondary | ICD-10-CM | POA: Diagnosis not present

## 2024-07-12 DIAGNOSIS — E1165 Type 2 diabetes mellitus with hyperglycemia: Secondary | ICD-10-CM | POA: Diagnosis not present

## 2024-07-12 DIAGNOSIS — Z7984 Long term (current) use of oral hypoglycemic drugs: Secondary | ICD-10-CM

## 2024-07-12 DIAGNOSIS — E1159 Type 2 diabetes mellitus with other circulatory complications: Secondary | ICD-10-CM | POA: Diagnosis not present

## 2024-07-12 DIAGNOSIS — Z789 Other specified health status: Secondary | ICD-10-CM

## 2024-07-12 DIAGNOSIS — E785 Hyperlipidemia, unspecified: Secondary | ICD-10-CM | POA: Diagnosis not present

## 2024-07-12 MED ORDER — SEMAGLUTIDE (1 MG/DOSE) 4 MG/3ML ~~LOC~~ SOPN: 1.0000 mg | PEN_INJECTOR | SUBCUTANEOUS | 2 refills | Status: AC

## 2024-07-12 NOTE — Patient Instructions (Signed)
 To keep you healthy, please keep in mind the following health maintenance items that you are due for:   Health Maintenance Due  Topic Date Due   Hepatitis B Vaccines 19-59 Average Risk (1 of 3 - 19+ 3-dose series) Never done   Diabetic kidney evaluation - Urine ACR  02/03/2024   FOOT EXAM  02/03/2024   Cervical Cancer Screening (HPV/Pap Cotest)  08/29/2024     Best Wishes,   Dr. Lang

## 2024-07-12 NOTE — Assessment & Plan Note (Signed)
 Type 2 diabetes mellitus with hypertension, hyperlipidemia, and stage 3a chronic kidney disease Chronic  Type 2 diabetes is well-controlled with an A1c of 5.9% as of November. She experiences occasional shakiness, possibly due to medication. Blood pressure is 138/84 mmHg. Weight has decreased by 9 pounds since August. No significant gastrointestinal side effects from Ozempic . Regular bowel movements once a week, consistent with her history. Foot exam performed. - Ordered A1c, CMP, urine albumin, and hepatitis B antibody titer. - Continue simvastatin  20 mg daily. - Continue Ozempic  0.5 mg weekly, will increase to 1 mg weekly. - Continue metformin  1000 mg twice daily. - Discontinued glipizide . - Continue albuterol  as needed. - Scheduled follow-up in May for A1c check.

## 2024-07-12 NOTE — Assessment & Plan Note (Signed)
 Chronic  CMP ordered  Continue ozempic  1.0mg  weekly  Urine albumin ordered

## 2024-07-12 NOTE — Assessment & Plan Note (Signed)
Chronic Continue simvastatin 20 mg daily

## 2024-07-12 NOTE — Assessment & Plan Note (Signed)
 Chronic  Continue

## 2024-07-12 NOTE — Progress Notes (Signed)
 "  Established Patient Office Visit  Patient ID: [name], female    DOB: April 12, 1971  Age: 54 y.o. MRN: [id number] PCP: Sharma Coyer, MD  Chief Complaint  Patient presents with   Medical Management of Chronic Issues    Subjective:     HPI  Discussed the use of AI scribe software for clinical note transcription with the patient, who gave verbal consent to proceed.  History of Present Illness Lisa Rivers is a 54 year old female with type 2 diabetes, hypertension, hyperlipidemia, and chronic kidney disease who presents for a chronic follow-up and hepatitis B antibody titer.  Her last A1c was 5.9% in November. She is on Ozempic  0.5 mg weekly, metformin  1000 mg twice daily, and glipizide  10 mg twice daily. She experiences occasional episodes of shakiness that improve with eating or drinking. She has lost approximately 10-15 pounds since July of the previous year.  Her blood pressure today was 138/84 mmHg. She continues to take simvastatin  20 mg daily for hyperlipidemia.  She has a history of chronic kidney disease, stage 3A.  She uses albuterol  as needed but has not used it recently. She has a history of asthma and bronchitis, which occasionally necessitates the use of albuterol .  She has bowel movements once a week, sometimes more frequently, a pattern since childhood attributed to habits formed during her time in foster care.  She has a new puppy, which has affected her sleep routine.  No constipation, diarrhea, or nausea with the use of Ozempic . Occasional shakiness resolves with food or drink.   Patient Active Problem List   Diagnosis Date Noted   Annual physical exam 12/06/2023   Other chronic postprocedural pain 02/03/2023   Benign essential microscopic hematuria 06/15/2022   Stage 3a chronic kidney disease (HCC) 04/21/2022   Hypertension associated with diabetes (HCC) 04/21/2022   Hyperlipidemia associated with type 2 diabetes mellitus  (HCC) 10/22/2021   Type 2 diabetes mellitus with hyperglycemia, without long-term current use of insulin  (HCC) 12/30/2014      ROS    Objective:     BP 138/84 (BP Location: Left Arm, Patient Position: Sitting, Cuff Size: Normal)   Pulse (!) 104   Temp 98.2 F (36.8 C) (Oral)   Ht 5' 2 (1.575 m)   Wt 172 lb 6.4 oz (78.2 kg)   SpO2 100%   BMI 31.53 kg/m  BP Readings from Last 3 Encounters:  07/12/24 138/84  02/16/24 129/63  01/18/24 138/79   Wt Readings from Last 3 Encounters:  07/12/24 172 lb 6.4 oz (78.2 kg)  02/16/24 183 lb (83 kg)  01/18/24 187 lb (84.8 kg)      Physical Exam  Physical Exam VITALS: BP- 138/84 CHEST: Clear to auscultation bilaterally, no wheezes, rhonchi, or crackles. CARDIOVASCULAR: Regular rate and rhythm, normal dorsalis pedis and posterior tibialis pulses. ABDOMEN: Normal bowel sounds. EXTREMITIES: No lower extremity edema. NEUROLOGICAL: Sensation intact in both feet.   No results found for any visits on 07/12/24.  Last metabolic panel Lab Results  Component Value Date   GLUCOSE 105 (H) 05/15/2024   NA 141 05/15/2024   K 5.0 05/15/2024   CL 102 05/15/2024   CO2 25 05/15/2024   BUN 11 05/15/2024   CREATININE 0.93 05/15/2024   EGFR 73 05/15/2024   CALCIUM 9.9 05/15/2024   PROT 7.4 12/06/2023   ALBUMIN 4.9 12/06/2023   LABGLOB 2.5 12/06/2023   AGRATIO 1.7 04/21/2022   BILITOT 0.4 12/06/2023   ALKPHOS 97 12/06/2023  AST 79 (H) 12/06/2023   ALT 79 (H) 12/06/2023   ANIONGAP 11 06/24/2022   Last lipids Lab Results  Component Value Date   CHOL 168 12/06/2023   HDL 46 12/06/2023   LDLCALC 76 12/06/2023   TRIG 286 (H) 12/06/2023   CHOLHDL 3.1 02/03/2023   Last hemoglobin A1c Lab Results  Component Value Date   HGBA1C 5.9 (H) 05/15/2024     The 10-year ASCVD risk score (Arnett DK, et al., 2019) is: 4.3%    Assessment & Plan:   Problem List Items Addressed This Visit     Hyperlipidemia associated with type 2 diabetes  mellitus (HCC)   Chronic  Continue simvastatin  20mg  daily       Relevant Medications   Semaglutide , 1 MG/DOSE, 4 MG/3ML SOPN   Other Relevant Orders   Lipid panel   Hypertension associated with diabetes (HCC)   Chronic  Continue       Relevant Medications   Semaglutide , 1 MG/DOSE, 4 MG/3ML SOPN   Other Relevant Orders   CMP14+EGFR   Urine Albumin/Creatinine with ratio (send out) [LAB689]   Stage 3a chronic kidney disease (HCC)   Chronic  CMP ordered  Continue ozempic  1.0mg  weekly  Urine albumin ordered       Relevant Orders   CMP14+EGFR   Type 2 diabetes mellitus with hyperglycemia, without long-term current use of insulin  (HCC) - Primary   Type 2 diabetes mellitus with hypertension, hyperlipidemia, and stage 3a chronic kidney disease Chronic  Type 2 diabetes is well-controlled with an A1c of 5.9% as of November. She experiences occasional shakiness, possibly due to medication. Blood pressure is 138/84 mmHg. Weight has decreased by 9 pounds since August. No significant gastrointestinal side effects from Ozempic . Regular bowel movements once a week, consistent with her history. Foot exam performed. - Ordered A1c, CMP, urine albumin, and hepatitis B antibody titer. - Continue simvastatin  20 mg daily. - Continue Ozempic  0.5 mg weekly, will increase to 1 mg weekly. - Continue metformin  1000 mg twice daily. - Discontinued glipizide . - Continue albuterol  as needed. - Scheduled follow-up in May for A1c check.      Relevant Medications   Semaglutide , 1 MG/DOSE, 4 MG/3ML SOPN   Other Relevant Orders   CMP14+EGFR   Other Visit Diagnoses       Hepatitis B vaccination status unknown       Relevant Orders   Hepatitis B Surface AntiBODY       Assessment and Plan Assessment & Plan   General health maintenance Discussed the importance of hepatitis B vaccination to prevent liver damage and cancer. Explained the vaccination schedule if needed. Cervical cancer screening is  due in June. - Ordered hepatitis B antibody titer. - Will coordinate hepatitis B vaccination if antibody titer is negative. - Will schedule cervical cancer screening (Pap smear) in June.   Return in about 5 months (around 12/10/2024) for CPE, Pap (a1c).    Rockie Agent, MD [facility]   "

## 2024-07-13 LAB — CMP14+EGFR
ALT: 40 IU/L — ABNORMAL HIGH (ref 0–32)
AST: 28 IU/L (ref 0–40)
Albumin: 4.6 g/dL (ref 3.8–4.9)
Alkaline Phosphatase: 76 IU/L (ref 49–135)
BUN/Creatinine Ratio: 14 (ref 9–23)
BUN: 12 mg/dL (ref 6–24)
Bilirubin Total: 0.4 mg/dL (ref 0.0–1.2)
CO2: 26 mmol/L (ref 20–29)
Calcium: 10.4 mg/dL — ABNORMAL HIGH (ref 8.7–10.2)
Chloride: 98 mmol/L (ref 96–106)
Creatinine, Ser: 0.85 mg/dL (ref 0.57–1.00)
Globulin, Total: 2.7 g/dL (ref 1.5–4.5)
Glucose: 91 mg/dL (ref 70–99)
Potassium: 4.1 mmol/L (ref 3.5–5.2)
Sodium: 140 mmol/L (ref 134–144)
Total Protein: 7.3 g/dL (ref 6.0–8.5)
eGFR: 81 mL/min/1.73

## 2024-07-13 LAB — LIPID PANEL
Chol/HDL Ratio: 2.6 ratio (ref 0.0–4.4)
Cholesterol, Total: 156 mg/dL (ref 100–199)
HDL: 59 mg/dL
LDL Chol Calc (NIH): 69 mg/dL (ref 0–99)
Triglycerides: 170 mg/dL — ABNORMAL HIGH (ref 0–149)
VLDL Cholesterol Cal: 28 mg/dL (ref 5–40)

## 2024-07-13 LAB — HEPATITIS B SURFACE ANTIBODY,QUALITATIVE: Hep B Surface Ab, Qual: NONREACTIVE

## 2024-07-13 LAB — MICROALBUMIN / CREATININE URINE RATIO
Creatinine, Urine: 145.2 mg/dL
Microalb/Creat Ratio: 10 mg/g{creat} (ref 0–29)
Microalbumin, Urine: 13.9 ug/mL

## 2024-07-15 ENCOUNTER — Ambulatory Visit: Payer: Self-pay | Admitting: Family Medicine

## 2024-08-07 ENCOUNTER — Other Ambulatory Visit: Payer: Self-pay | Admitting: Family Medicine

## 2024-08-07 DIAGNOSIS — E1165 Type 2 diabetes mellitus with hyperglycemia: Secondary | ICD-10-CM

## 2024-12-13 ENCOUNTER — Encounter: Admitting: Family Medicine
